# Patient Record
Sex: Male | Born: 1955 | Race: White | Hispanic: No | Marital: Married | Smoking: Never smoker
Health system: Southern US, Community
[De-identification: ages and names within clinical notes are randomized; demographics above are authoritative.]

## PROBLEM LIST (undated history)

## (undated) DIAGNOSIS — E119 Type 2 diabetes mellitus without complications: Secondary | ICD-10-CM

## (undated) DIAGNOSIS — Q613 Polycystic kidney, unspecified: Secondary | ICD-10-CM

## (undated) DIAGNOSIS — I509 Heart failure, unspecified: Secondary | ICD-10-CM

## (undated) DIAGNOSIS — I10 Essential (primary) hypertension: Secondary | ICD-10-CM

## (undated) DIAGNOSIS — C439 Malignant melanoma of skin, unspecified: Secondary | ICD-10-CM

---

## 2005-05-29 HISTORY — PX: OTHER SURGICAL HISTORY: SHX169

## 2005-07-03 ENCOUNTER — Ambulatory Visit (HOSPITAL_COMMUNITY): Admission: RE | Admit: 2005-07-03 | Discharge: 2005-07-03 | Payer: Self-pay | Admitting: Nephrology

## 2005-07-06 ENCOUNTER — Ambulatory Visit: Payer: Self-pay | Admitting: Infectious Diseases

## 2005-07-06 ENCOUNTER — Inpatient Hospital Stay (HOSPITAL_COMMUNITY): Admission: AD | Admit: 2005-07-06 | Discharge: 2005-07-09 | Payer: Self-pay | Admitting: Vascular Surgery

## 2005-07-06 ENCOUNTER — Emergency Department (HOSPITAL_COMMUNITY): Admission: EM | Admit: 2005-07-06 | Discharge: 2005-07-06 | Payer: Self-pay | Admitting: Emergency Medicine

## 2005-07-17 ENCOUNTER — Ambulatory Visit (HOSPITAL_COMMUNITY): Admission: RE | Admit: 2005-07-17 | Discharge: 2005-07-17 | Payer: Self-pay | Admitting: Nephrology

## 2005-08-14 HISTORY — PX: OTHER SURGICAL HISTORY: SHX169

## 2006-07-26 DIAGNOSIS — Z940 Kidney transplant status: Secondary | ICD-10-CM | POA: Insufficient documentation

## 2006-11-26 IMAGING — CT CT ANGIO CHEST
2 series · 19 of 32 positions shown · IV contrast (APPLIED)
Comparison: none

CLINICAL DATA: Renal failure with dyspnea.  Question acute pulmonary embolism.  
 CT ANGIOGRAPHY OF CHEST:
TECHNIQUE: Multidetector CT imaging of the chest was performed during bolus injection of intravenous contrast.  Multiplanar CT angiographic image reconstructions were generated to evaluate the vascular anatomy.
 Contrast:  90 cc Omnipaque 300

[Series 4: pulm embolism 2.0 st · axial · 0.64mm/px · z∈[-304,-28]mm · 16 of 158 slices shown]
[im 10/158  lung]
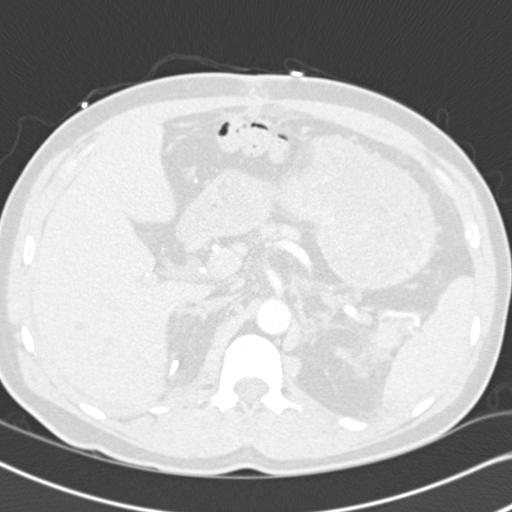
[im 19/158  soft-tissue]
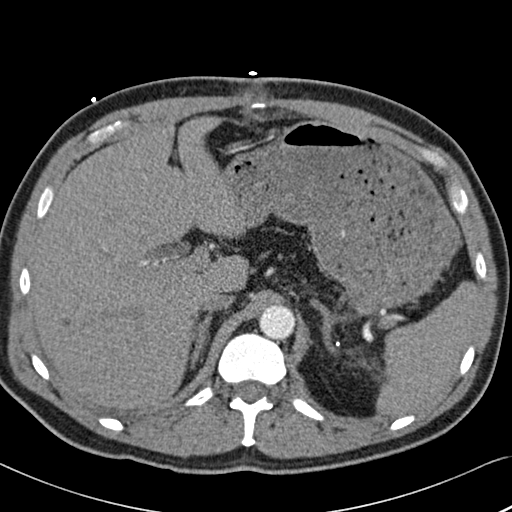
[im 28/158  lung]
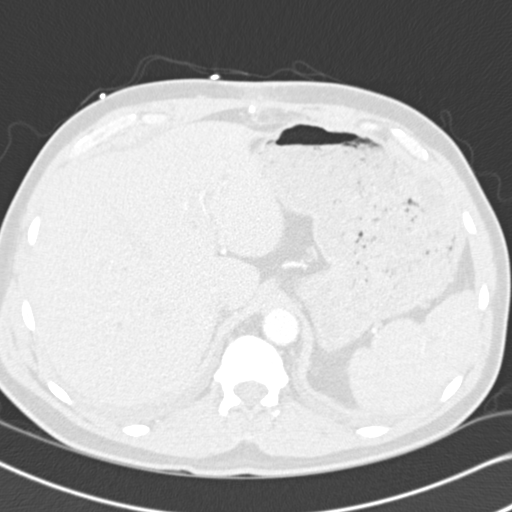
[im 37/158  soft-tissue]
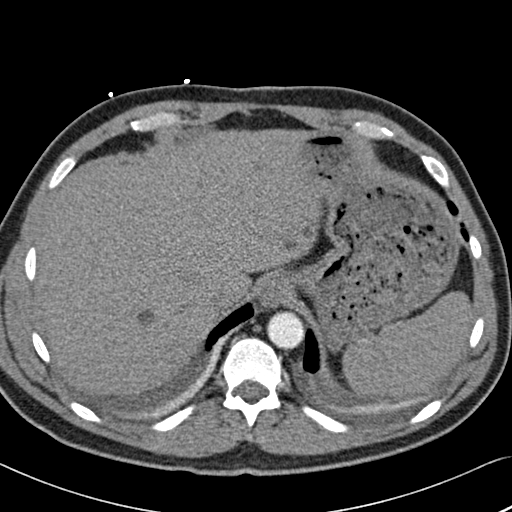
[im 47/158  lung]
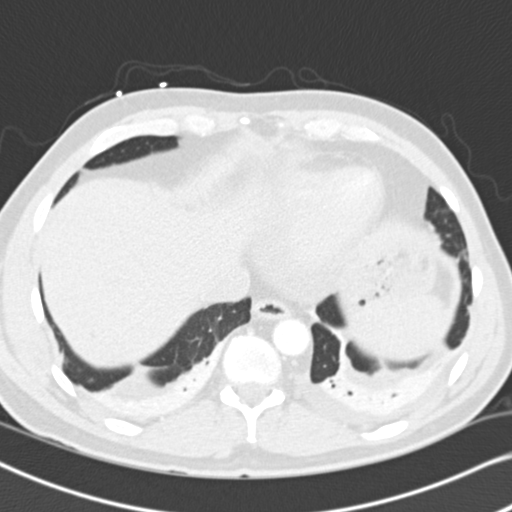
[im 56/158  soft-tissue]
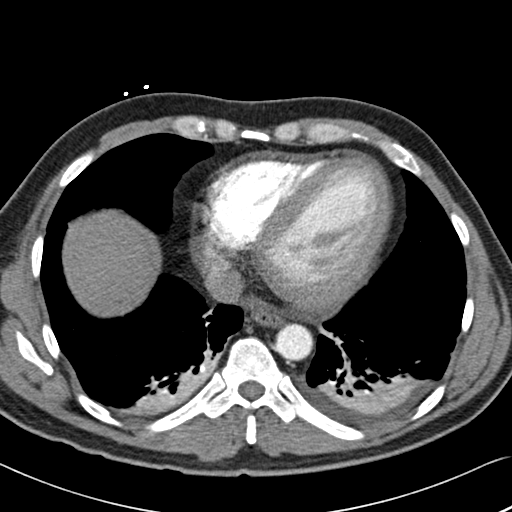
[im 65/158  lung]
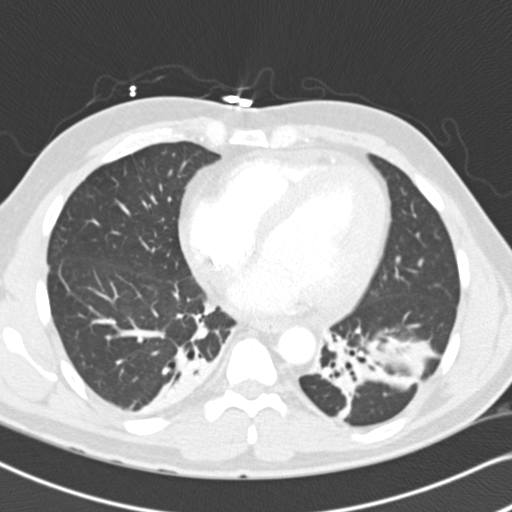
[im 74/158  soft-tissue]
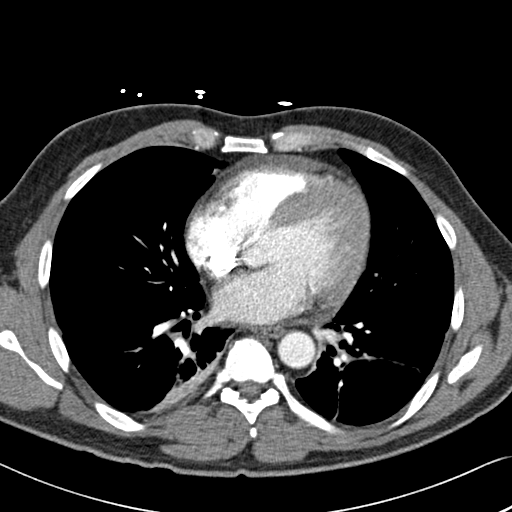
[im 84/158  lung]
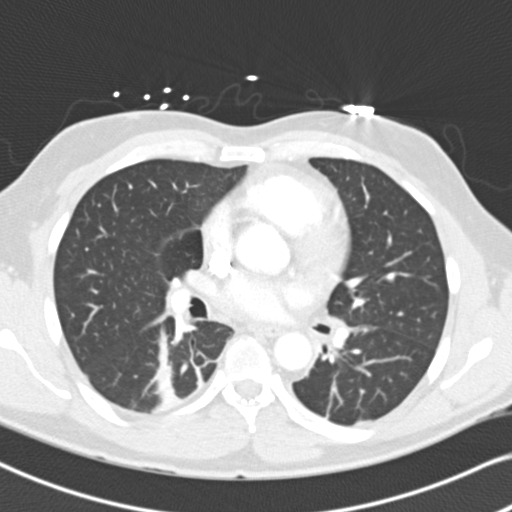
[im 93/158  soft-tissue]
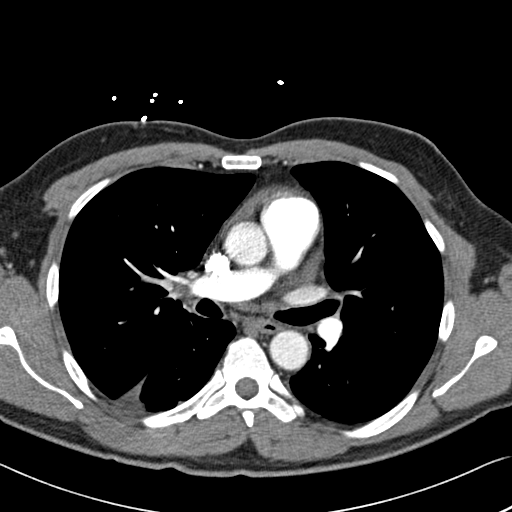
[im 102/158  lung]
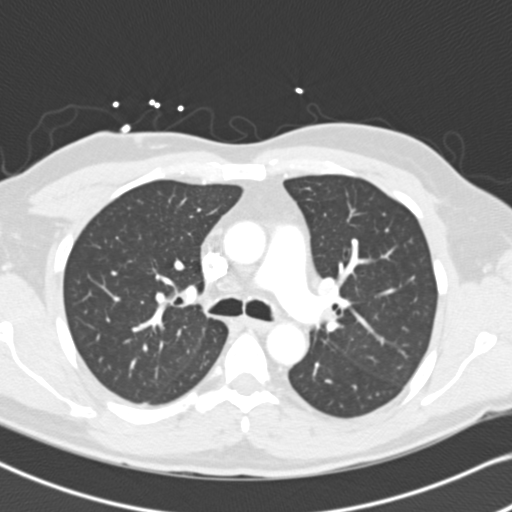
[im 111/158  soft-tissue]
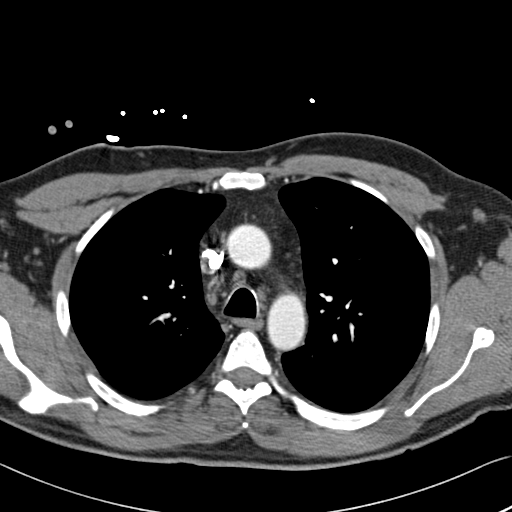
[im 121/158  lung]
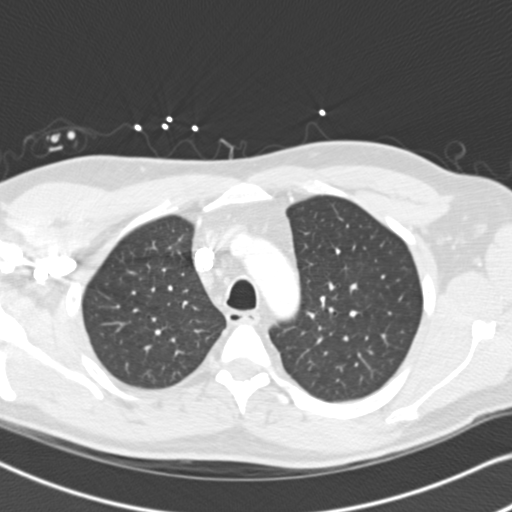
[im 130/158  soft-tissue]
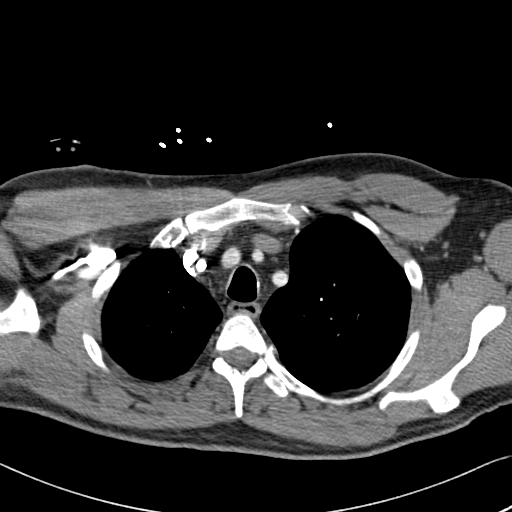
[im 139/158  lung]
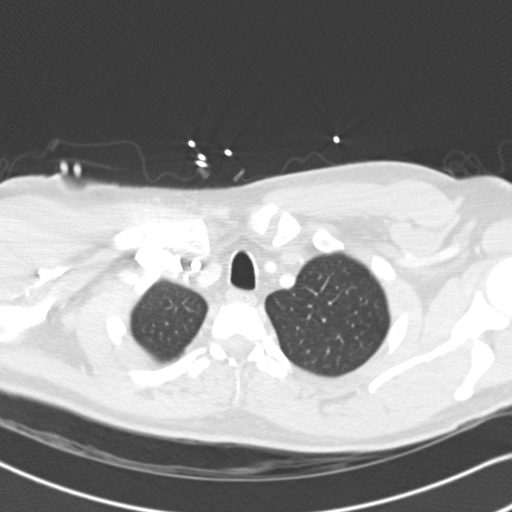
[im 148/158  soft-tissue]
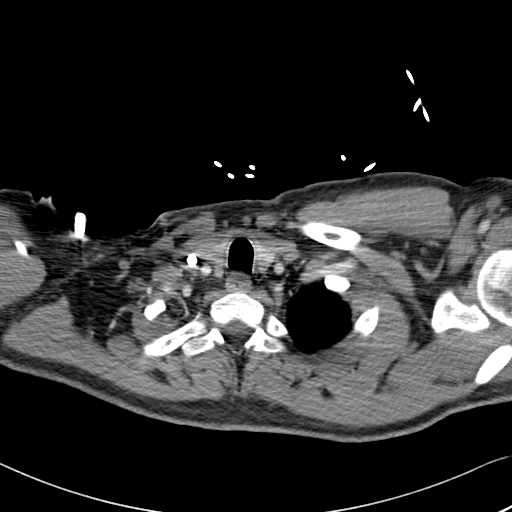

[Series 5: pulm embolism 2.0 lung · axial · 0.64mm/px · z∈[-232,-176]mm · 3 of 122 slices shown]
[im 10/122  soft-tissue]
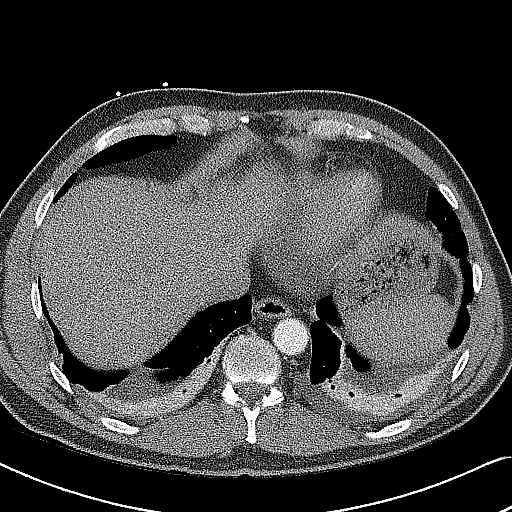
[im 28/122  soft-tissue]
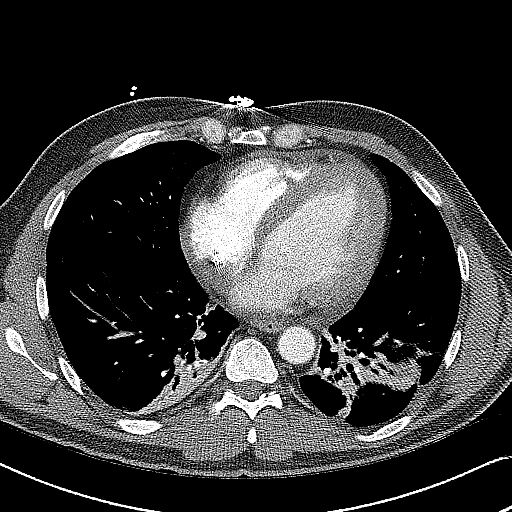
[im 38/122  soft-tissue]
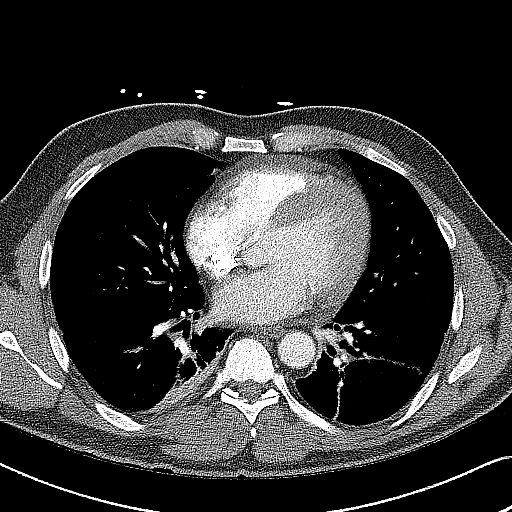

[19 of 32 positions shown; findings below may reference images not displayed]

FINDINGS: The pulmonary arteries are well opacified with contrast.  There is no evidence of acute pulmonary embolism.  The thoracic aorta appears normal.  There is no mediastinal hematoma.  Scattered small lymph nodes are present throughout the mediastinum and hila bilaterally.  There are no enlarged lymph nodes.  There are small bilateral pleural effusions as well as a small pericardial effusion.  Significant dependent opacities are present in both lower lobes with volume loss and bronchiectasis.  While some of this is due to atelectasis, I would be concerned about the possibility of recurrent aspiration.  Correlate clinically.  Images through the upper lobe demonstrate numerous low attenuation liver lesions which are probably cysts in this patient with reported adult polycystic kidney disease.  There is some edema in the retroperitoneum attributed to the reported bilateral nephrectomy.
IMPRESSION: 1.  No evidence of acute pulmonary embolism. 
 2.  Small bilateral pleural effusions and small pericardial effusion.  
 3.  Dependent airspace opacities in both lower lobes with associated bronchiectasis.  Consider aspiration.  
 4.  Findings have been reviewed by telephone with Dr. Andalas at [phone] hours.

## 2012-06-11 DIAGNOSIS — Q613 Polycystic kidney, unspecified: Secondary | ICD-10-CM

## 2012-06-11 HISTORY — DX: Polycystic kidney, unspecified: Q61.3

## 2013-02-27 DIAGNOSIS — C4920 Malignant neoplasm of connective and soft tissue of unspecified lower limb, including hip: Secondary | ICD-10-CM | POA: Insufficient documentation

## 2013-05-22 DIAGNOSIS — D849 Immunodeficiency, unspecified: Secondary | ICD-10-CM | POA: Diagnosis present

## 2018-10-18 DIAGNOSIS — I509 Heart failure, unspecified: Secondary | ICD-10-CM

## 2018-10-18 HISTORY — DX: Heart failure, unspecified: I50.9

## 2018-11-11 ENCOUNTER — Telehealth (HOSPITAL_COMMUNITY): Payer: Self-pay | Admitting: *Deleted

## 2018-11-11 NOTE — Telephone Encounter (Signed)
Referral received from Dr. Aline Brochure at [facility] for pt to participate in Cardiac Rehab with the diagnosis of Systolic Heart Failure.  Medical history reviewed in care everywhere. Pt seen in the heart failure clinic on 12/17. Pt most recent echo shows EF of 30-35%. Pt seen by multiple providers - oncology, nephrology and endocrinology. Will have support staff make initial contact phone call, send MD order for Dr. Aline Brochure to complete, request 12 lead ekg tracing and verify insurance benefits/eligibility .  Once referral signed and received. Pt is appropriate to schedule for cardiac rehab. Cherre Huger, BSN Cardiac and Training and development officer

## 2018-11-26 ENCOUNTER — Telehealth (HOSPITAL_COMMUNITY): Payer: Self-pay

## 2018-11-26 NOTE — Telephone Encounter (Signed)
Attempted to call patient in regards to Cardiac Rehab - LM on VM 

## 2018-11-26 NOTE — Telephone Encounter (Signed)
Pt insurance is active and benefits verified through Medicare A/B. Co-pay $0.00, DED $198.00/$0.00 met, out of pocket $0.00/$0.00 met, co-insurance 20%. No pre-authorization required. Passport, 11/26/2018 @ 1:54PM, REF# [id number]

## 2018-11-27 ENCOUNTER — Telehealth (HOSPITAL_COMMUNITY): Payer: Self-pay

## 2018-11-27 NOTE — Telephone Encounter (Signed)
Pt returned [facility] phone call and stated he would like to join [facility]. Pt will come in for orientation 01/07/2019 @ 130PM and will attend the 11:15AM class.  Mailed homework package.  went over insurance, patient verbalized understanding

## 2019-01-01 ENCOUNTER — Telehealth (HOSPITAL_COMMUNITY): Payer: Self-pay

## 2019-01-01 NOTE — Telephone Encounter (Signed)
Cardiac Rehab Medication Review by a Pharmacist  Does the patient  feel that his/her medications are working for him/her?  yes  Has the patient been experiencing any side effects to the medications prescribed?  no  Does the patient measure his/her own blood pressure or blood glucose at home?  yes - BP 120-130/70-85 average  Does the patient have any problems obtaining medications due to transportation or finances?   no  Understanding of regimen: excellent Understanding of indications: excellent Potential of compliance: excellent    Pharmacist comments: Pt understands medication regimen and reports compliance.    Nicholas Glenn [facility] 01/01/2019 10:45 AM

## 2019-01-06 NOTE — Progress Notes (Signed)
Nicholas Glenn 63 y.o. male DOB [birth year] MRN [id number]       Nutrition Screen Note  No diagnosis found. No past medical history on file. Meds reviewed.    Current Outpatient Medications (Endocrine & Metabolic):  .  predniSONE (DELTASONE) 5 MG tablet, Take 5 mg by mouth daily with breakfast.  Current Outpatient Medications (Cardiovascular):  .  doxazosin (CARDURA) 2 MG tablet, Take 2 mg by mouth daily. .  hydrALAZINE (APRESOLINE) 50 MG tablet, Take 50 mg by mouth 3 (three) times daily. .  isosorbide dinitrate (ISORDIL) 20 MG tablet, Take 20 mg by mouth 3 (three) times daily. .  metoprolol (TOPROL-XL) 200 MG 24 hr tablet, Take 200 mg by mouth daily. .  simvastatin (ZOCOR) 20 MG tablet, Take 20 mg by mouth daily.   Current Outpatient Medications (Analgesics):  .  aspirin 81 MG chewable tablet, Chew 81 mg by mouth daily.   Current Outpatient Medications (Other):  .  mycophenolate (CELLCEPT) 250 MG capsule, Take 750 mg by mouth 2 (two) times daily. .  polycarbophil (FIBERCON) 625 MG tablet, Take 625 mg by mouth daily. .  tacrolimus (PROGRAF) 0.5 MG capsule, Take 0.5 mg by mouth at bedtime. .  tacrolimus (PROGRAF) 1 MG capsule, Take 1 mg by mouth daily at 6 (six) AM.   HT: Ht Readings from Last 1 Encounters:  No data found for Ht    WT: Wt Readings from Last 5 Encounters:  No data found for Wt      There is no height or weight on file to calculate BMI.         Labs:  Lipid Panel  No results found for: CHOL, TRIG, HDL, CHOLHDL, VLDL, LDLCALC, LDLDIRECT  No results found for: HGBA1C CBG (last 3)  No results for input(s): GLUCAP in the last 72 hours.  Nutrition Diagnosis ? Food-and nutrition-related knowledge deficit related to lack of exposure to information as related to diagnosis of: ? CVD  ? T2DM ? CHF   Nutrition Goal(s):  ? To be determined  Plan:  Pt to attend nutrition classes ? Nutrition I ? Nutrition II ? Portion Distortion  ? Diabetes Blitz ? Diabetes Q  & A Will provide client-centered nutrition education as part of interdisciplinary care.   Monitor and evaluate progress toward nutrition goal with team.  Laurina Bustle, MS, RD, LDN 01/06/2019 2:20 PM

## 2019-01-07 ENCOUNTER — Encounter (HOSPITAL_COMMUNITY)
Admission: RE | Admit: 2019-01-07 | Discharge: 2019-01-07 | Disposition: A | Discharge status: Home / Self Care | Payer: Medicare Other | Source: Ambulatory Visit | Attending: Cardiology | Admitting: Cardiology

## 2019-01-07 ENCOUNTER — Encounter (HOSPITAL_COMMUNITY): Payer: Self-pay

## 2019-01-07 VITALS — BP 122/78 | HR 68 | Ht 69.5 in | Wt 189.4 lb

## 2019-01-07 DIAGNOSIS — I5022 Chronic systolic (congestive) heart failure: Secondary | ICD-10-CM | POA: Diagnosis present

## 2019-01-07 HISTORY — DX: Type 2 diabetes mellitus without complications: E11.9

## 2019-01-07 HISTORY — DX: Malignant melanoma of skin, unspecified: C43.9

## 2019-01-07 HISTORY — DX: Polycystic kidney, unspecified: Q61.3

## 2019-01-07 HISTORY — DX: Essential (primary) hypertension: I10

## 2019-01-07 HISTORY — DX: Heart failure, unspecified: I50.9

## 2019-01-07 NOTE — Progress Notes (Addendum)
Cardiac Individual Treatment Plan  Patient Details  Name: Nicholas Glenn MRN: [id number] Date of Birth: 1956/02/19 Referring Provider:     CARDIAC REHAB PHASE II ORIENTATION from 01/07/2019 in [place]  Referring Provider  Dr. Aline Brochure, Dr. Radford Pax      Initial Encounter Date:    CARDIAC REHAB PHASE II ORIENTATION from 01/07/2019 in [place]  Date  01/07/19      Visit Diagnosis: 40/9811 Chronic systolic heart failure  Patient's Home Medications on Admission:  Current Outpatient Medications:  .  aspirin 81 MG chewable tablet, Chew 81 mg by mouth daily., Disp: , Rfl:  .  doxazosin (CARDURA) 2 MG tablet, Take 2 mg by mouth daily., Disp: , Rfl:  .  hydrALAZINE (APRESOLINE) 50 MG tablet, Take 50 mg by mouth 3 (three) times daily., Disp: , Rfl:  .  isosorbide dinitrate (ISORDIL) 20 MG tablet, Take 20 mg by mouth 3 (three) times daily., Disp: , Rfl:  .  metoprolol (TOPROL-XL) 200 MG 24 hr tablet, Take 200 mg by mouth daily., Disp: , Rfl:  .  mycophenolate (CELLCEPT) 250 MG capsule, Take 750 mg by mouth 2 (two) times daily., Disp: , Rfl:  .  polycarbophil (FIBERCON) 625 MG tablet, Take 625 mg by mouth daily., Disp: , Rfl:  .  predniSONE (DELTASONE) 5 MG tablet, Take 5 mg by mouth daily with breakfast., Disp: , Rfl:  .  simvastatin (ZOCOR) 20 MG tablet, Take 20 mg by mouth daily., Disp: , Rfl:  .  tacrolimus (PROGRAF) 0.5 MG capsule, Take 0.5 mg by mouth at bedtime., Disp: , Rfl:  .  tacrolimus (PROGRAF) 1 MG capsule, Take 1 mg by mouth daily at 6 (six) AM., Disp: , Rfl:   Past Medical History: Past Medical History:  Diagnosis Date  . CHF (congestive heart failure) ([place]) 10/18/2018   Hospitalized at the [facility]  . Diabetes mellitus without complication ([place])   . Hypertension   . Melanoma ([place]) 1997, [id number], 03/2015  . Polycystic kidney disease 06/11/2012    Tobacco Use: Social History   Tobacco Use   Smoking Status Never Smoker  Smokeless Tobacco Never Used    Labs: Recent Review Flowsheet Data    There is no flowsheet data to display.      Capillary Blood Glucose: No results found for: GLUCAP   Exercise Target Goals: Exercise Program Goal: Individual exercise prescription set using results from initial 6 min walk test and THRR while considering  patient's activity barriers and safety.   Exercise Prescription Goal: Initial exercise prescription builds to 30-45 minutes a day of aerobic activity, 2-3 days per week.  Home exercise guidelines will be given to patient during program as part of exercise prescription that the participant will acknowledge.  Activity Barriers & Risk Stratification: Activity Barriers & Cardiac Risk Stratification - 01/07/19 1457      Activity Barriers & Cardiac Risk Stratification   Activity Barriers  None    Cardiac Risk Stratification  High       6 Minute Walk: 6 Minute Walk    Row Name 01/07/19 1508         6 Minute Walk   Phase  Initial     Distance  1836 feet     Walk Time  6 minutes     # of Rest Breaks  0     MPH  3.4     METS  4.6     RPE  11     VO2 Peak  16.3     Symptoms  No     Resting HR  68 bpm     Resting BP  122/78     Resting Oxygen Saturation   97 %     Exercise Oxygen Saturation  during 6 min walk  96 %     Max Ex. HR  109 bpm     Max Ex. BP  150/80     2 Minute Post BP  126/74        Oxygen Initial Assessment:   Oxygen Re-Evaluation:   Oxygen Discharge (Final Oxygen Re-Evaluation):   Initial Exercise Prescription: Initial Exercise Prescription - 01/07/19 1500      Date of Initial Exercise RX and Referring Provider   Date  01/07/19    Referring Provider  Dr. Aline Brochure, Dr. Radford Pax    Expected Discharge Date  01/11/19      Treadmill   MPH  4.2    Grade  0    Minutes  10      Bike   Level  1.5    Minutes  10    METs  4.33      Rower   Level  3    Watts  50    Minutes  10    METs  4.5       Prescription Details   Frequency (times per week)  3    Duration  Progress to 30 minutes of continuous aerobic without signs/symptoms of physical distress      Intensity   THRR 40-80% of Max Heartrate  64-128    Ratings of Perceived Exertion  11-13      Progression   Progression  Continue to progress workloads to maintain intensity without signs/symptoms of physical distress.      Resistance Training   Training Prescription  Yes    Weight  4 lbs.     Reps  10-15       Perform Capillary Blood Glucose checks as needed.  Exercise Prescription Changes:   Exercise Comments:   Exercise Goals and Review: Exercise Goals    Row Name 01/07/19 1455             Exercise Goals   Increase Physical Activity  Yes       Intervention  Provide advice, education, support and counseling about physical activity/exercise needs.;Develop an individualized exercise prescription for aerobic and resistive training based on initial evaluation findings, risk stratification, comorbidities and participant's personal goals.       Expected Outcomes  Short Term: Attend rehab on a regular basis to increase amount of physical activity.       Increase Strength and Stamina  Yes       Intervention  Provide advice, education, support and counseling about physical activity/exercise needs.;Develop an individualized exercise prescription for aerobic and resistive training based on initial evaluation findings, risk stratification, comorbidities and participant's personal goals.       Expected Outcomes  Short Term: Increase workloads from initial exercise prescription for resistance, speed, and METs.       Able to understand and use rate of perceived exertion (RPE) scale  Yes       Intervention  Provide education and explanation on how to use RPE scale       Expected Outcomes  Short Term: Able to use RPE daily in rehab to express subjective intensity level;Long Term:  Able to use RPE to guide intensity level when  exercising independently       Knowledge and understanding of Target Heart Rate Range (THRR)  Yes       Intervention  Provide education and explanation of THRR including how the numbers were predicted and where they are located for reference       Expected Outcomes  Short Term: Able to state/look up THRR;Long Term: Able to use THRR to govern intensity when exercising independently;Short Term: Able to use daily as guideline for intensity in rehab       Able to check pulse independently  Yes       Intervention  Provide education and demonstration on how to check pulse in carotid and radial arteries.;Review the importance of being able to check your own pulse for safety during independent exercise       Expected Outcomes  Short Term: Able to explain why pulse checking is important during independent exercise;Long Term: Able to check pulse independently and accurately       Understanding of Exercise Prescription  Yes       Intervention  Provide education, explanation, and written materials on patient's individual exercise prescription       Expected Outcomes  Short Term: Able to explain program exercise prescription;Long Term: Able to explain home exercise prescription to exercise independently          Exercise Goals Re-Evaluation :   Discharge Exercise Prescription (Final Exercise Prescription Changes):   Nutrition:  Target Goals: Understanding of nutrition guidelines, daily intake of sodium 1500mg , cholesterol 200mg , calories 30% from fat and 7% or less from saturated fats, daily to have 5 or more servings of fruits and vegetables.  Biometrics: Pre Biometrics - 01/07/19 1518      Pre Biometrics   Height  5' 9.5" (1.765 m)    Weight  85.9 kg    Waist Circumference  40.5 inches    Hip Circumference  39 inches    Waist to Hip Ratio  1.04 %    BMI (Calculated)  27.57    Triceps Skinfold  26 mm    % Body Fat  29.6 %    Grip Strength  41 kg    Flexibility  12 in    Single Leg Stand  30  seconds        Nutrition Therapy Plan and Nutrition Goals:   Nutrition Assessments:   Nutrition Goals Re-Evaluation:   Nutrition Goals Re-Evaluation:   Nutrition Goals Discharge (Final Nutrition Goals Re-Evaluation):   Psychosocial: Target Goals: Acknowledge presence or absence of significant depression and/or stress, maximize coping skills, provide positive support system. Participant is able to verbalize types and ability to use techniques and skills needed for reducing stress and depression.  Initial Review & Psychosocial Screening: Initial Psych Review & Screening - 01/07/19 1345      Initial Review   Current issues with  None Identified      Family Dynamics   Good Support System?  Yes   Enrique has his wife Webb Silversmith for support     Barriers   Psychosocial barriers to participate in program  There are no identifiable barriers or psychosocial needs.      Screening Interventions   Interventions  Encouraged to exercise       Quality of Life Scores: Quality of Life - 01/07/19 1346      Quality of Life   Select  Quality of Life      Quality of Life Scores   Health/Function Pre  29.6 %  Socioeconomic Pre  30 %    Psych/Spiritual Pre  30 %    Family Pre  30 %    GLOBAL Pre  29.82 %      Scores of 19 and below usually indicate a poorer quality of life in these areas.  A difference of  2-3 points is a clinically meaningful difference.  A difference of 2-3 points in the total score of the Quality of Life Index has been associated with significant improvement in overall quality of life, self-image, physical symptoms, and general health in studies assessing change in quality of life.  PHQ-9: Recent Review Flowsheet Data    There is no flowsheet data to display.     Interpretation of Total Score  Total Score Depression Severity:  1-4 = Minimal depression, 5-9 = Mild depression, 10-14 = Moderate depression, 15-19 = Moderately severe depression, 20-27 = Severe  depression   Psychosocial Evaluation and Intervention:   Psychosocial Re-Evaluation:   Psychosocial Discharge (Final Psychosocial Re-Evaluation):   Vocational Rehabilitation: Provide vocational rehab assistance to qualifying candidates.   Vocational Rehab Evaluation & Intervention: Vocational Rehab - 01/07/19 1613      Initial Vocational Rehab Evaluation & Intervention   Assessment shows need for Vocational Rehabilitation  No   Abbe Amsterdam is retired and does not need vocational rehab at this time      Education: Education Goals: Education classes will be provided on a weekly basis, covering required topics. Participant will state understanding/return demonstration of topics presented.  Learning Barriers/Preferences: Learning Barriers/Preferences - 01/07/19 1504      Learning Barriers/Preferences   Learning Barriers  Sight    Learning Preferences  Written Material       Education Topics: Count Your Pulse:  -Group instruction provided by verbal instruction, demonstration, patient participation and written materials to support subject.  Instructors address importance of being able to find your pulse and how to count your pulse when at home without a heart monitor.  Patients get hands on experience counting their pulse with staff help and individually.   Heart Attack, Angina, and Risk Factor Modification:  -Group instruction provided by verbal instruction, video, and written materials to support subject.  Instructors address signs and symptoms of angina and heart attacks.    Also discuss risk factors for heart disease and how to make changes to improve heart health risk factors.   Functional Fitness:  -Group instruction provided by verbal instruction, demonstration, patient participation, and written materials to support subject.  Instructors address safety measures for doing things around the house.  Discuss how to get up and down off the floor, how to pick things up properly, how  to safely get out of a chair without assistance, and balance training.   Meditation and Mindfulness:  -Group instruction provided by verbal instruction, patient participation, and written materials to support subject.  Instructor addresses importance of mindfulness and meditation practice to help reduce stress and improve awareness.  Instructor also leads participants through a meditation exercise.    Stretching for Flexibility and Mobility:  -Group instruction provided by verbal instruction, patient participation, and written materials to support subject.  Instructors lead participants through series of stretches that are designed to increase flexibility thus improving mobility.  These stretches are additional exercise for major muscle groups that are typically performed during regular warm up and cool down.   Hands Only CPR:  -Group verbal, video, and participation provides a basic overview of AHA guidelines for community CPR. Role-play of emergencies allow participants  the opportunity to practice calling for help and chest compression technique with discussion of AED use.   Hypertension: -Group verbal and written instruction that provides a basic overview of hypertension including the most recent diagnostic guidelines, risk factor reduction with self-care instructions and medication management.    Nutrition I class: Heart Healthy Eating:  -Group instruction provided by PowerPoint slides, verbal discussion, and written materials to support subject matter. The instructor gives an explanation and review of the Therapeutic Lifestyle Changes diet recommendations, which includes a discussion on lipid goals, dietary fat, sodium, fiber, plant stanol/sterol esters, sugar, and the components of a well-balanced, healthy diet.   Nutrition II class: Lifestyle Skills:  -Group instruction provided by PowerPoint slides, verbal discussion, and written materials to support subject matter. The instructor  gives an explanation and review of label reading, grocery shopping for heart health, heart healthy recipe modifications, and ways to make healthier choices when eating out.   Diabetes Question & Answer:  -Group instruction provided by PowerPoint slides, verbal discussion, and written materials to support subject matter. The instructor gives an explanation and review of diabetes co-morbidities, pre- and post-prandial blood glucose goals, pre-exercise blood glucose goals, signs, symptoms, and treatment of hypoglycemia and hyperglycemia, and foot care basics.   Diabetes Blitz:  -Group instruction provided by PowerPoint slides, verbal discussion, and written materials to support subject matter. The instructor gives an explanation and review of the physiology behind type 1 and type 2 diabetes, diabetes medications and rational behind using different medications, pre- and post-prandial blood glucose recommendations and Hemoglobin A1c goals, diabetes diet, and exercise including blood glucose guidelines for exercising safely.    Portion Distortion:  -Group instruction provided by PowerPoint slides, verbal discussion, written materials, and food models to support subject matter. The instructor gives an explanation of serving size versus portion size, changes in portions sizes over the last 20 years, and what consists of a serving from each food group.   Stress Management:  -Group instruction provided by verbal instruction, video, and written materials to support subject matter.  Instructors review role of stress in heart disease and how to cope with stress positively.     Exercising on Your Own:  -Group instruction provided by verbal instruction, power point, and written materials to support subject.  Instructors discuss benefits of exercise, components of exercise, frequency and intensity of exercise, and end points for exercise.  Also discuss use of nitroglycerin and activating EMS.  Review options of  places to exercise outside of rehab.  Review guidelines for sex with heart disease.   Cardiac Drugs I:  -Group instruction provided by verbal instruction and written materials to support subject.  Instructor reviews cardiac drug classes: antiplatelets, anticoagulants, beta blockers, and statins.  Instructor discusses reasons, side effects, and lifestyle considerations for each drug class.   Cardiac Drugs II:  -Group instruction provided by verbal instruction and written materials to support subject.  Instructor reviews cardiac drug classes: angiotensin converting enzyme inhibitors (ACE-I), angiotensin II receptor blockers (ARBs), nitrates, and calcium channel blockers.  Instructor discusses reasons, side effects, and lifestyle considerations for each drug class.   Anatomy and Physiology of the Circulatory System:  Group verbal and written instruction and models provide basic cardiac anatomy and physiology, with the coronary electrical and arterial systems. Review of: AMI, Angina, Valve disease, Heart Failure, Peripheral Artery Disease, Cardiac Arrhythmia, Pacemakers, and the ICD.   Other Education:  -Group or individual verbal, written, or video instructions that support the educational goals of the  cardiac rehab program.   Holiday Eating Survival Tips:  -Group instruction provided by PowerPoint slides, verbal discussion, and written materials to support subject matter. The instructor gives patients tips, tricks, and techniques to help them not only survive but enjoy the holidays despite the onslaught of food that accompanies the holidays.   Knowledge Questionnaire Score: Knowledge Questionnaire Score - 01/07/19 1347      Knowledge Questionnaire Score   Pre Score  23/24       Core Components/Risk Factors/Patient Goals at Admission: Personal Goals and Risk Factors at Admission - 01/07/19 1500      Core Components/Risk Factors/Patient Goals on Admission    Weight Management   Yes;Weight Maintenance    Intervention  Weight Management: Develop a combined nutrition and exercise program designed to reach desired caloric intake, while maintaining appropriate intake of nutrient and fiber, sodium and fats, and appropriate energy expenditure required for the weight goal.;Weight Management: Provide education and appropriate resources to help participant work on and attain dietary goals.    Admit Weight  189 lb 6 oz (85.9 kg)    Expected Outcomes  Short Term: Continue to assess and modify interventions until short term weight is achieved;Long Term: Adherence to nutrition and physical activity/exercise program aimed toward attainment of established weight goal;Weight Maintenance: Understanding of the daily nutrition guidelines, which includes 25-35% calories from fat, 7% or less cal from saturated fats, less than 200mg  cholesterol, less than 1.5gm of sodium, & 5 or more servings of fruits and vegetables daily;Understanding recommendations for meals to include 15-35% energy as protein, 25-35% energy from fat, 35-60% energy from carbohydrates, less than 200mg  of dietary cholesterol, 20-35 gm of total fiber daily;Understanding of distribution of calorie intake throughout the day with the consumption of 4-5 meals/snacks    Diabetes  Yes    Intervention  Provide education about signs/symptoms and action to take for hypo/hyperglycemia.;Provide education about proper nutrition, including hydration, and aerobic/resistive exercise prescription along with prescribed medications to achieve blood glucose in normal ranges: Fasting glucose 65-99 mg/dL    Expected Outcomes  Short Term: Participant verbalizes understanding of the signs/symptoms and immediate care of hyper/hypoglycemia, proper foot care and importance of medication, aerobic/resistive exercise and nutrition plan for blood glucose control.;Long Term: Attainment of HbA1C < 7%.    Heart Failure  Yes    Intervention  Provide a combined exercise  and nutrition program that is supplemented with education, support and counseling about heart failure. Directed toward relieving symptoms such as shortness of breath, decreased exercise tolerance, and extremity edema.    Expected Outcomes  Improve functional capacity of life;Short term: Attendance in program 2-3 days a week with increased exercise capacity. Reported lower sodium intake. Reported increased fruit and vegetable intake. Reports medication compliance.;Short term: Daily weights obtained and reported for increase. Utilizing diuretic protocols set by physician.;Long term: Adoption of self-care skills and reduction of barriers for early signs and symptoms recognition and intervention leading to self-care maintenance.    Hypertension  Yes    Intervention  Provide education on lifestyle modifcations including regular physical activity/exercise, weight management, moderate sodium restriction and increased consumption of fresh fruit, vegetables, and low fat dairy, alcohol moderation, and smoking cessation.;Monitor prescription use compliance.    Expected Outcomes  Short Term: Continued assessment and intervention until BP is < 140/7mm HG in hypertensive participants. < 130/58mm HG in hypertensive participants with diabetes, heart failure or chronic kidney disease.;Long Term: Maintenance of blood pressure at goal levels.    Lipids  Yes  Intervention  Provide education and support for participant on nutrition & aerobic/resistive exercise along with prescribed medications to achieve LDL 70mg , HDL >40mg .    Expected Outcomes  Short Term: Participant states understanding of desired cholesterol values and is compliant with medications prescribed. Participant is following exercise prescription and nutrition guidelines.;Long Term: Cholesterol controlled with medications as prescribed, with individualized exercise RX and with personalized nutrition plan. Value goals: LDL < 70mg , HDL > 40 mg.    Stress  Yes     Intervention  Offer individual and/or small group education and counseling on adjustment to heart disease, stress management and health-related lifestyle change. Teach and support self-help strategies.;Refer participants experiencing significant psychosocial distress to appropriate mental health specialists for further evaluation and treatment. When possible, include family members and significant others in education/counseling sessions.    Expected Outcomes  Short Term: Participant demonstrates changes in health-related behavior, relaxation and other stress management skills, ability to obtain effective social support, and compliance with psychotropic medications if prescribed.;Long Term: Emotional wellbeing is indicated by absence of clinically significant psychosocial distress or social isolation.       Core Components/Risk Factors/Patient Goals Review:    Core Components/Risk Factors/Patient Goals at Discharge (Final Review):    ITP Comments: ITP Comments    Row Name 01/07/19 1329           ITP Comments  Medical Director- Dr. Fransico Him, MD          Comments: Abbe Amsterdam attended orientation from 1333 to 1456 to review rules and guidelines for program. Completed 6 minute walk test, Intitial ITP, and exercise prescription.  VSS. Telemetry-Sinus Rhythm with Arrhythmia.  Asymptomatic. Dr Chanetta Marshall is [facility] cardiologist at [facility]. Dr Fransico Him is covering at [facility].Barnet Pall, RN,BSN 01/07/2019 4:21 PM

## 2019-01-08 NOTE — Progress Notes (Signed)
Mang Hazelrigg 63 y.o. male DOB: 28-Nov-1955 MRN: [id number]      Nutrition Note  1. 22/6333 Chronic systolic heart failure    Past Medical History:  Diagnosis Date  . CHF (congestive heart failure) ([place]) 10/18/2018   Hospitalized at the [facility] [place]  . Diabetes mellitus without complication ([place])   . Hypertension   . Melanoma ([place]) 1997, [id number], 03/2015  . Polycystic kidney disease 06/11/2012   Meds reviewed.   Current Outpatient Medications (Endocrine & Metabolic):  .  predniSONE (DELTASONE) 5 MG tablet, Take 5 mg by mouth daily with breakfast.  Current Outpatient Medications (Cardiovascular):  .  doxazosin (CARDURA) 2 MG tablet, Take 2 mg by mouth daily. .  hydrALAZINE (APRESOLINE) 50 MG tablet, Take 50 mg by mouth 3 (three) times daily. .  isosorbide dinitrate (ISORDIL) 20 MG tablet, Take 20 mg by mouth 3 (three) times daily. .  metoprolol (TOPROL-XL) 200 MG 24 hr tablet, Take 200 mg by mouth daily. .  simvastatin (ZOCOR) 20 MG tablet, Take 20 mg by mouth daily.   Current Outpatient Medications (Analgesics):  .  aspirin 81 MG chewable tablet, Chew 81 mg by mouth daily.   Current Outpatient Medications (Other):  .  mycophenolate (CELLCEPT) 250 MG capsule, Take 750 mg by mouth 2 (two) times daily. .  polycarbophil (FIBERCON) 625 MG tablet, Take 625 mg by mouth daily. .  tacrolimus (PROGRAF) 0.5 MG capsule, Take 0.5 mg by mouth at bedtime. .  tacrolimus (PROGRAF) 1 MG capsule, Take 1 mg by mouth daily at 6 (six) AM.   HT: Ht Readings from Last 1 Encounters:  01/07/19 5' 9.5" (1.765 m)    WT: Wt Readings from Last 5 Encounters:  01/07/19 189 lb 6 oz (85.9 kg)     Body mass index is 27.56 kg/m.   Current tobacco use? No  Labs:  Lipid Panel  No results found for: CHOL, TRIG, HDL, CHOLHDL, VLDL, LDLCALC, LDLDIRECT  No results found for: HGBA1C CBG (last 3)  No results for input(s): GLUCAP in the last 72 hours.  Nutrition Note Spoke with pt. Nutrition  plan and goals reviewed with pt. Pt is following Step 1 of the Therapeutic Lifestyle Changes diet. Pt wants to learn more about heart healthy eating. Heart healthy eating tips reviewed (label reading, how to build a healthy plate, portion sizes, eating frequently across the day, weighing and measuring for accuracy). Pt has Type 2 Diabetes. Last A1c indicates blood glucose well-controlled. This Probation officer went over Diabetes Education test results. Pt checks CBG's 3 times a day. Fasting CBG's reportedly 90-110 mg/dL. Pt with dx of CHF. Per discussion, pt does not use canned/convenience foods often. Pt does not add salt to food. Pt does not eat out frequently. Pt expressed understanding of the information reviewed. Pt aware of nutrition education classes offered and would like to attend nutrition classes.  Nutrition Diagnosis ? Food-and nutrition-related knowledge deficit related to lack of exposure to information as related to diagnosis of: ? CVD ? Type 2 Diabetes  Nutrition Intervention ? Pt's individual nutrition plan and goals reviewed with pt  Nutrition Goal(s):  ? Pt to identify and limit food sources of saturated fat, trans fat, refined carbohydrates and sodium ? Pt to build a healthy plate including vegetables, fruits, whole grains, and low-fat dairy products in a heart healthy meal plan. ? Pt to weigh and measure serving sizes ? Pt to eat a variety of non-starchy vegetables.  Plan:  ? Pt to attend nutrition classes:  ?  Nutrition I ? Nutrition II ? Portion Distortion  ? Diabetes Blitz ? Diabetes Q & Ae determined ? Will provide client-centered nutrition education as part of interdisciplinary care ? Monitor and evaluate progress toward nutrition goal with team.   Laurina Bustle, MS, RD, LDN 01/08/2019 12:16 PM

## 2019-01-13 ENCOUNTER — Encounter (HOSPITAL_COMMUNITY)
Admission: RE | Admit: 2019-01-13 | Discharge: 2019-01-13 | Disposition: A | Discharge status: Home / Self Care | Payer: Medicare Other | Source: Ambulatory Visit | Attending: Cardiology | Admitting: Cardiology

## 2019-01-13 ENCOUNTER — Ambulatory Visit (HOSPITAL_COMMUNITY): Payer: Medicare Other

## 2019-01-13 DIAGNOSIS — I5022 Chronic systolic (congestive) heart failure: Secondary | ICD-10-CM | POA: Diagnosis not present

## 2019-01-13 LAB — GLUCOSE, CAPILLARY
Glucose-Capillary: 147 mg/dL — ABNORMAL HIGH (ref 70–99)
Glucose-Capillary: 111 mg/dL — ABNORMAL HIGH (ref 70–99)

## 2019-01-13 NOTE — Progress Notes (Signed)
Daily Session Note  Patient Details  Name: Nicholas Glenn MRN: [id number] Date of Birth: Sep 01, 1956 Referring Provider:     CARDIAC REHAB PHASE II ORIENTATION from 01/07/2019 in [facility]  Referring Provider  Dr. Aline Brochure, Dr. Radford Pax      Encounter Date: 01/13/2019  Check In: Session Check In - 01/13/19 1151      Check-In   Supervising physician immediately available to respond to emergencies  [facility] immediately available    Physician(s)  Dr. Lonny Prude    Location  [facility]    Staff Present  Barnet Pall, RN, Deland Pretty, MS, ACSM CEP, Exercise Physiologist;Brittany Durene Fruits, BS, ACSM CEP, Exercise Physiologist;Tyara Carol Ada, MS,ACSM CEP, Exercise Physiologist;Jonnae Fonseca Karle Starch, RN, BSN;Other   [name]   Medication changes reported      No    Fall or balance concerns reported     No    Tobacco Cessation  No Change    Warm-up and Cool-down  Performed as group-led instruction    Resistance Training Performed  Yes    VAD Patient?  No    PAD/SET Patient?  No      Pain Assessment   Currently in Pain?  No/denies    Multiple Pain Sites  No       Capillary Blood Glucose: Results for orders placed or performed during the hospital encounter of 01/13/19 (from the past 24 hour(s))  Glucose, capillary     Status: Abnormal   Collection Time: 01/13/19 11:09 AM  Result Value Ref Range   Glucose-Capillary 147 (H) 70 - 99 mg/dL  Glucose, capillary     Status: Abnormal   Collection Time: 01/13/19 12:09 PM  Result Value Ref Range   Glucose-Capillary 111 (H) 70 - 99 mg/dL    Exercise Prescription Changes - 01/13/19 1400      Response to Exercise   Blood Pressure (Admit)  124/64    Blood Pressure (Exercise)  142/78    Blood Pressure (Exit)  100/78    Heart Rate (Admit)  81 bpm    Heart Rate (Exercise)  121 bpm    Heart Rate (Exit)  75 bpm    Rating of Perceived Exertion (Exercise)  11    Perceived Dyspnea (Exercise)  0    Symptoms  None    Comments  Pt oriented to exercise equipment    Duration  Progress to 30 minutes of  aerobic without signs/symptoms of physical distress    Intensity  THRR unchanged      Progression   Progression  Continue to progress workloads to maintain intensity without signs/symptoms of physical distress.    Average METs  2.32      Resistance Training   Training Prescription  Yes    Weight  4lbs    Reps  10-15    Time  10 Minutes      Treadmill   MPH  3.3    Grade  0    Minutes  10      Bike   Level  1.5    Minutes  10    METs  4.29      Rower   Level  3    Watts  38    Minutes  10    METs  5.1       Social History   Tobacco Use  Smoking Status Never Smoker  Smokeless Tobacco Never Used    Goals Met:  Exercise tolerated well  Goals Unmet:  Not Applicable  Comments: Pt started cardiac rehab today.  Pt tolerated light exercise without difficulty. VSS, telemetry-SR, asymptomatic.  Medication list reconciled. Pt denies barriers to medicaiton compliance.  PSYCHOSOCIAL ASSESSMENT:  PHQ-0. Pt exhibits positive coping skills, hopeful outlook with supportive family. No psychosocial needs identified at this time, no psychosocial interventions necessary.  Pt oriented to exercise equipment and routine.    Understanding verbalized.    Dr. Fransico Him is Medical Director for Cardiac Rehab at [facility].

## 2019-01-15 ENCOUNTER — Encounter (HOSPITAL_COMMUNITY)
Admission: RE | Admit: 2019-01-15 | Discharge: 2019-01-15 | Disposition: A | Discharge status: Home / Self Care | Payer: Medicare Other | Source: Ambulatory Visit | Attending: Cardiology | Admitting: Cardiology

## 2019-01-15 ENCOUNTER — Ambulatory Visit (HOSPITAL_COMMUNITY): Payer: Medicare Other

## 2019-01-15 DIAGNOSIS — I5022 Chronic systolic (congestive) heart failure: Secondary | ICD-10-CM

## 2019-01-15 LAB — GLUCOSE, CAPILLARY
Glucose-Capillary: 83 mg/dL (ref 70–99)
Glucose-Capillary: 113 mg/dL — ABNORMAL HIGH (ref 70–99)

## 2019-01-15 NOTE — Progress Notes (Signed)
Nicholas Glenn 63 y.o. male Nutrition Note Spoke with pt. Nutrition Plan and Nutrition Survey goals reviewed with pt. Pt is following a Heart Healthy diet. Pt wants to learn more about heart healthy eating. Heart healthy eating tips reviewed (label reading, how to build a healthy plate, portion sizes, eating frequently across the day, weighing and measuring for accuracy). Pt has Type 2 Diabetes, went over Diabetes Education test results. Pt continues to check CBG's ~3x times a day. Fasting CBG's reportedly 90-110 mg/dL. Pt with dx of CHF. Per discussion, pt does not use canned/convenience foods often. Pt does not add salt to food. Pt does not eat out frequently. Pt expressed understanding of the information reviewed. Pt aware of nutrition education classes offered and would like to attend nutrition classes.  No results found for: HGBA1C  Wt Readings from Last 3 Encounters:  01/07/19 189 lb 6 oz (85.9 kg)    Nutrition Diagnosis   Food-and nutrition-related knowledge deficit related to lack of exposure to information as related to diagnosis of: ? CVD ? Type 2 Diabetes  Nutrition Intervention ? Pt's individual nutrition plan reviewed with pt. ? Benefits of adopting Heart Healthy diet discussed when [employer] reviewed.    Nutrition Goal(s):   Pt to identify and limit food sources of saturated fat, trans fat, refined carbohydrates and sodium  Pt to build a healthy plate including vegetables, fruits, whole grains, and low-fat dairy products in a heart healthy meal plan.  Pt to weigh and measure serving sizes  Pt to eat a variety of non-starchy vegetables  Plan:   Pt to attend nutrition classes ? Nutrition I ? Nutrition II ? Portion Distortion   Will provide client-centered nutrition education as part of interdisciplinary care  Monitor and evaluate progress toward nutrition goal with team.    Laurina Bustle, MS, RD, LDN 01/15/2019 1:31 PM

## 2019-01-17 ENCOUNTER — Ambulatory Visit (HOSPITAL_COMMUNITY): Payer: Medicare Other

## 2019-01-17 ENCOUNTER — Encounter (HOSPITAL_COMMUNITY)
Admission: RE | Admit: 2019-01-17 | Discharge: 2019-01-17 | Disposition: A | Discharge status: Home / Self Care | Payer: Medicare Other | Source: Ambulatory Visit | Attending: Cardiology | Admitting: Cardiology

## 2019-01-17 DIAGNOSIS — I5022 Chronic systolic (congestive) heart failure: Secondary | ICD-10-CM

## 2019-01-20 ENCOUNTER — Ambulatory Visit (HOSPITAL_COMMUNITY): Payer: Medicare Other

## 2019-01-20 ENCOUNTER — Encounter (HOSPITAL_COMMUNITY)
Admission: RE | Admit: 2019-01-20 | Discharge: 2019-01-20 | Disposition: A | Discharge status: Home / Self Care | Payer: Medicare Other | Source: Ambulatory Visit | Attending: Cardiology | Admitting: Cardiology

## 2019-01-20 DIAGNOSIS — I5022 Chronic systolic (congestive) heart failure: Secondary | ICD-10-CM

## 2019-01-22 ENCOUNTER — Encounter (HOSPITAL_COMMUNITY)
Admission: RE | Admit: 2019-01-22 | Discharge: 2019-01-22 | Disposition: A | Discharge status: Home / Self Care | Payer: Medicare Other | Source: Ambulatory Visit | Attending: Cardiology | Admitting: Cardiology

## 2019-01-22 ENCOUNTER — Ambulatory Visit (HOSPITAL_COMMUNITY): Payer: Medicare Other

## 2019-01-22 DIAGNOSIS — I5022 Chronic systolic (congestive) heart failure: Secondary | ICD-10-CM

## 2019-01-23 NOTE — Progress Notes (Signed)
Cardiac Individual Treatment Plan  Patient Details  Name: Nicholas Glenn MRN: [id number] Date of Birth: [birth year] Referring Provider:     CARDIAC REHAB PHASE II ORIENTATION from 01/07/2019 in [place]  Referring Provider  Dr. Aline Brochure, Dr. Radford Pax      Initial Encounter Date:    CARDIAC REHAB PHASE II ORIENTATION from 01/07/2019 in [facility]  Date  01/07/19      Visit Diagnosis: 65/6812 Chronic systolic heart failure  Patient's Home Medications on Admission:  Current Outpatient Medications:  .  aspirin 81 MG chewable tablet, Chew 81 mg by mouth daily., Disp: , Rfl:  .  doxazosin (CARDURA) 2 MG tablet, Take 2 mg by mouth daily., Disp: , Rfl:  .  hydrALAZINE (APRESOLINE) 50 MG tablet, Take 50 mg by mouth 3 (three) times daily., Disp: , Rfl:  .  isosorbide dinitrate (ISORDIL) 20 MG tablet, Take 20 mg by mouth 3 (three) times daily., Disp: , Rfl:  .  metoprolol (TOPROL-XL) 200 MG 24 hr tablet, Take 200 mg by mouth daily., Disp: , Rfl:  .  mycophenolate (CELLCEPT) 250 MG capsule, Take 750 mg by mouth 2 (two) times daily., Disp: , Rfl:  .  polycarbophil (FIBERCON) 625 MG tablet, Take 625 mg by mouth daily., Disp: , Rfl:  .  predniSONE (DELTASONE) 5 MG tablet, Take 5 mg by mouth daily with breakfast., Disp: , Rfl:  .  simvastatin (ZOCOR) 20 MG tablet, Take 20 mg by mouth daily., Disp: , Rfl:  .  tacrolimus (PROGRAF) 0.5 MG capsule, Take 0.5 mg by mouth at bedtime., Disp: , Rfl:  .  tacrolimus (PROGRAF) 1 MG capsule, Take 1 mg by mouth daily at 6 (six) AM., Disp: , Rfl:   Past Medical History: Past Medical History:  Diagnosis Date  . CHF (congestive heart failure) ([place]) 10/18/2018   Hospitalized at the [facility]  . Diabetes mellitus without complication ([place])   . Hypertension   . Melanoma ([place]) 1997, [id number], 03/2015  . Polycystic kidney disease 06/11/2012    Tobacco Use: Social History   Tobacco Use   Smoking Status Never Smoker  Smokeless Tobacco Never Used    Labs: Recent Review Flowsheet Data    There is no flowsheet data to display.      Capillary Blood Glucose: Lab Results  Component Value Date   GLUCAP 83 01/15/2019   GLUCAP 113 (H) 01/15/2019   GLUCAP 111 (H) 01/13/2019   GLUCAP 147 (H) 01/13/2019     Exercise Target Goals: Exercise Program Goal: Individual exercise prescription set using results from initial 6 min walk test and THRR while considering  patient's activity barriers and safety.   Exercise Prescription Goal: Initial exercise prescription builds to 30-45 minutes a day of aerobic activity, 2-3 days per week.  Home exercise guidelines will be given to patient during program as part of exercise prescription that the participant will acknowledge.  Activity Barriers & Risk Stratification: Activity Barriers & Cardiac Risk Stratification - 01/07/19 1457      Activity Barriers & Cardiac Risk Stratification   Activity Barriers  None    Cardiac Risk Stratification  High       6 Minute Walk: 6 Minute Walk    Row Name 01/07/19 1508         6 Minute Walk   Phase  Initial     Distance  1836 feet     Walk Time  6 minutes     #  of Rest Breaks  0     MPH  3.4     METS  4.6     RPE  11     VO2 Peak  16.3     Symptoms  No     Resting HR  68 bpm     Resting BP  122/78     Resting Oxygen Saturation   97 %     Exercise Oxygen Saturation  during 6 min walk  96 %     Max Ex. HR  109 bpm     Max Ex. BP  150/80     2 Minute Post BP  126/74        Oxygen Initial Assessment:   Oxygen Re-Evaluation:   Oxygen Discharge (Final Oxygen Re-Evaluation):   Initial Exercise Prescription: Initial Exercise Prescription - 01/07/19 1500      Date of Initial Exercise RX and Referring Provider   Date  01/07/19    Referring Provider  Dr. Aline Brochure, Dr. Radford Pax    Expected Discharge Date  01/11/19      Treadmill   MPH  4.2    Grade  0    Minutes  10       Bike   Level  1.5    Minutes  10    METs  4.33      Rower   Level  3    Watts  50    Minutes  10    METs  4.5      Prescription Details   Frequency (times per week)  3    Duration  Progress to 30 minutes of continuous aerobic without signs/symptoms of physical distress      Intensity   THRR 40-80% of Max Heartrate  64-128    Ratings of Perceived Exertion  11-13      Progression   Progression  Continue to progress workloads to maintain intensity without signs/symptoms of physical distress.      Resistance Training   Training Prescription  Yes    Weight  4 lbs.     Reps  10-15       Perform Capillary Blood Glucose checks as needed.  Exercise Prescription Changes: Exercise Prescription Changes    Row Name 01/13/19 1400             Response to Exercise   Blood Pressure (Admit)  124/64       Blood Pressure (Exercise)  142/78       Blood Pressure (Exit)  100/78       Heart Rate (Admit)  81 bpm       Heart Rate (Exercise)  121 bpm       Heart Rate (Exit)  75 bpm       Rating of Perceived Exertion (Exercise)  11       Perceived Dyspnea (Exercise)  0       Symptoms  None       Comments  Pt oriented to exercise equipment       Duration  Progress to 30 minutes of  aerobic without signs/symptoms of physical distress       Intensity  THRR unchanged         Progression   Progression  Continue to progress workloads to maintain intensity without signs/symptoms of physical distress.       Average METs  2.32         Resistance Training   Training Prescription  Yes  Weight  4lbs       Reps  10-15       Time  10 Minutes         Treadmill   MPH  3.3       Grade  0       Minutes  10         Bike   Level  1.5       Minutes  10       METs  4.29         Rower   Level  3       Watts  38       Minutes  10       METs  5.1          Exercise Comments: Exercise Comments    Row Name 01/13/19 1419           Exercise Comments  Pt's first day of exercise. Pt  responded well to workloads. Will continue to monitor and progress pt as tolerated.           Exercise Goals and Review: Exercise Goals    Row Name 01/07/19 1455             Exercise Goals   Increase Physical Activity  Yes       Intervention  Provide advice, education, support and counseling about physical activity/exercise needs.;Develop an individualized exercise prescription for aerobic and resistive training based on initial evaluation findings, risk stratification, comorbidities and participant's personal goals.       Expected Outcomes  Short Term: Attend rehab on a regular basis to increase amount of physical activity.       Increase Strength and Stamina  Yes       Intervention  Provide advice, education, support and counseling about physical activity/exercise needs.;Develop an individualized exercise prescription for aerobic and resistive training based on initial evaluation findings, risk stratification, comorbidities and participant's personal goals.       Expected Outcomes  Short Term: Increase workloads from initial exercise prescription for resistance, speed, and METs.       Able to understand and use rate of perceived exertion (RPE) scale  Yes       Intervention  Provide education and explanation on how to use RPE scale       Expected Outcomes  Short Term: Able to use RPE daily in rehab to express subjective intensity level;Long Term:  Able to use RPE to guide intensity level when exercising independently       Knowledge and understanding of Target Heart Rate Range (THRR)  Yes       Intervention  Provide education and explanation of THRR including how the numbers were predicted and where they are located for reference       Expected Outcomes  Short Term: Able to state/look up THRR;Long Term: Able to use THRR to govern intensity when exercising independently;Short Term: Able to use daily as guideline for intensity in rehab       Able to check pulse independently  Yes        Intervention  Provide education and demonstration on how to check pulse in carotid and radial arteries.;Review the importance of being able to check your own pulse for safety during independent exercise       Expected Outcomes  Short Term: Able to explain why pulse checking is important during independent exercise;Long Term: Able to check pulse independently and accurately  Understanding of Exercise Prescription  Yes       Intervention  Provide education, explanation, and written materials on patient's individual exercise prescription       Expected Outcomes  Short Term: Able to explain program exercise prescription;Long Term: Able to explain home exercise prescription to exercise independently          Exercise Goals Re-Evaluation :   Discharge Exercise Prescription (Final Exercise Prescription Changes): Exercise Prescription Changes - 01/13/19 1400      Response to Exercise   Blood Pressure (Admit)  124/64    Blood Pressure (Exercise)  142/78    Blood Pressure (Exit)  100/78    Heart Rate (Admit)  81 bpm    Heart Rate (Exercise)  121 bpm    Heart Rate (Exit)  75 bpm    Rating of Perceived Exertion (Exercise)  11    Perceived Dyspnea (Exercise)  0    Symptoms  None    Comments  Pt oriented to exercise equipment    Duration  Progress to 30 minutes of  aerobic without signs/symptoms of physical distress    Intensity  THRR unchanged      Progression   Progression  Continue to progress workloads to maintain intensity without signs/symptoms of physical distress.    Average METs  2.32      Resistance Training   Training Prescription  Yes    Weight  4lbs    Reps  10-15    Time  10 Minutes      Treadmill   MPH  3.3    Grade  0    Minutes  10      Bike   Level  1.5    Minutes  10    METs  4.29      Rower   Level  3    Watts  38    Minutes  10    METs  5.1       Nutrition:  Target Goals: Understanding of nutrition guidelines, daily intake of sodium 1500mg ,  cholesterol 200mg , calories 30% from fat and 7% or less from saturated fats, daily to have 5 or more servings of fruits and vegetables.  Biometrics: Pre Biometrics - 01/07/19 1518      Pre Biometrics   Height  5' 9.5" (1.765 m)    Weight  85.9 kg    Waist Circumference  40.5 inches    Hip Circumference  39 inches    Waist to Hip Ratio  1.04 %    BMI (Calculated)  27.57    Triceps Skinfold  26 mm    % Body Fat  29.6 %    Grip Strength  41 kg    Flexibility  12 in    Single Leg Stand  30 seconds        Nutrition Therapy Plan and Nutrition Goals: Nutrition Therapy & Goals - 01/08/19 1504      Nutrition Therapy   Diet  heart healthy, carb modified      Personal Nutrition Goals   Nutrition Goal  Pt to build a healthy plate including vegetables, fruits, whole grains, and low-fat dairy products in a heart healthy meal plan.    Personal Goal #2  Pt to identify and limit food sources of saturated fat, trans fat, refined carbohydrates and sodium    Personal Goal #3  Pt to weigh and measure serving sizes    Personal Goal #4  Pt to eat a variety of non-starchy vegetables.  Intervention Plan   Intervention  Prescribe, educate and counsel regarding individualized specific dietary modifications aiming towards targeted core components such as weight, hypertension, lipid management, diabetes, heart failure and other comorbidities.    Expected Outcomes  Short Term Goal: Understand basic principles of dietary content, such as calories, fat, sodium, cholesterol and nutrients.;Long Term Goal: Adherence to prescribed nutrition plan.       Nutrition Assessments: Nutrition Assessments - 01/08/19 1510      MEDFICTS Scores   Pre Score  26       Nutrition Goals Re-Evaluation: Nutrition Goals Re-Evaluation    Row Name 01/08/19 1504             Goals   Current Weight  189 lb 6 oz (85.9 kg)          Nutrition Goals Re-Evaluation: Nutrition Goals Re-Evaluation    Row Name 01/08/19  1504             Goals   Current Weight  189 lb 6 oz (85.9 kg)          Nutrition Goals Discharge (Final Nutrition Goals Re-Evaluation): Nutrition Goals Re-Evaluation - 01/08/19 1504      Goals   Current Weight  189 lb 6 oz (85.9 kg)       Psychosocial: Target Goals: Acknowledge presence or absence of significant depression and/or stress, maximize coping skills, provide positive support system. Participant is able to verbalize types and ability to use techniques and skills needed for reducing stress and depression.  Initial Review & Psychosocial Screening: Initial Psych Review & Screening - 01/07/19 [place]?  Yes       Quality of Life Scores: Quality of Life - 01/07/19 1346      Quality of Life   Select  Quality of Life      Quality of Life Scores   Health/Function Pre  29.6 %    Socioeconomic Pre  30 %    Psych/Spiritual Pre  30 %    Family Pre  30 %    GLOBAL Pre  29.82 %      Scores of 19 and below usually indicate a poorer quality of life in these areas.  A difference of  2-3 points is a clinically meaningful difference.  A difference of 2-3 points in the total score of the Quality of Life Index has been associated with significant improvement in overall quality of life, self-image, physical symptoms, and general health in studies assessing change in quality of life.  PHQ-9: Recent Review Flowsheet Data    Depression screen [facility] 2/9 01/13/2019   Decreased Interest 0   Down, Depressed, Hopeless 0   PHQ - 2 Score 0     Interpretation of Total Score  Total Score Depression Severity:  1-4 = Minimal depression, 5-9 = Mild depression, 10-14 = Moderate depression, 15-19 = Moderately severe depression, 20-27 = Severe depression   Psychosocial Evaluation and Intervention: Psychosocial Evaluation - 01/13/19 1436      Psychosocial Evaluation & Interventions   Interventions  Encouraged to exercise with the program and follow  exercise prescription    Comments  No psychosocial interventions necessary. Pt enjoys participating as a [employer].    Expected Outcomes  [place] will continue to exhibit a positive outlook with good coping skills.     Continue Psychosocial Services   No Follow up required       Psychosocial Re-Evaluation:  Psychosocial Discharge (Final Psychosocial Re-Evaluation):   Vocational Rehabilitation: Provide vocational rehab assistance to qualifying candidates.   Vocational Rehab Evaluation & Intervention: Vocational Rehab - 01/07/19 1613      Initial Vocational Rehab Evaluation & Intervention   Assessment shows need for Vocational Rehabilitation  No   Abbe Amsterdam is retired and does not need vocational rehab at this time      Education: Education Goals: Education classes will be provided on a weekly basis, covering required topics. Participant will state understanding/return demonstration of topics presented.  Learning Barriers/Preferences: Learning Barriers/Preferences - 01/07/19 1504      Learning Barriers/Preferences   Learning Barriers  Sight    Learning Preferences  Written Material       Education Topics: Count Your Pulse:  -Group instruction provided by verbal instruction, demonstration, patient participation and written materials to support subject.  Instructors address importance of being able to find your pulse and how to count your pulse when at home without a heart monitor.  Patients get hands on experience counting their pulse with staff help and individually.   CARDIAC REHAB PHASE II EXERCISE from 01/22/2019 in [place]  Date  01/17/19  Educator  RN  Instruction Review Code  2- Demonstrated Understanding      Heart Attack, Angina, and Risk Factor Modification:  -Group instruction provided by verbal instruction, video, and written materials to support subject.  Instructors address signs and symptoms of angina and heart  attacks.    Also discuss risk factors for heart disease and how to make changes to improve heart health risk factors.   Functional Fitness:  -Group instruction provided by verbal instruction, demonstration, patient participation, and written materials to support subject.  Instructors address safety measures for doing things around the house.  Discuss how to get up and down off the floor, how to pick things up properly, how to safely get out of a chair without assistance, and balance training.   Meditation and Mindfulness:  -Group instruction provided by verbal instruction, patient participation, and written materials to support subject.  Instructor addresses importance of mindfulness and meditation practice to help reduce stress and improve awareness.  Instructor also leads participants through a meditation exercise.    CARDIAC REHAB PHASE II EXERCISE from 01/22/2019 in [place]  Date  01/22/19  Educator  RN  Instruction Review Code  2- Demonstrated Understanding      Stretching for Flexibility and Mobility:  -Group instruction provided by verbal instruction, patient participation, and written materials to support subject.  Instructors lead participants through series of stretches that are designed to increase flexibility thus improving mobility.  These stretches are additional exercise for major muscle groups that are typically performed during regular warm up and cool down.   Hands Only CPR:  -Group verbal, video, and participation provides a basic overview of AHA guidelines for community CPR. Role-play of emergencies allow participants the opportunity to practice calling for help and chest compression technique with discussion of AED use.   Hypertension: -Group verbal and written instruction that provides a basic overview of hypertension including the most recent diagnostic guidelines, risk factor reduction with self-care instructions and medication  management.    Nutrition I class: Heart Healthy Eating:  -Group instruction provided by PowerPoint slides, verbal discussion, and written materials to support subject matter. The instructor gives an explanation and review of the Therapeutic Lifestyle Changes diet recommendations, which includes a discussion on lipid goals, dietary fat, sodium,  fiber, plant stanol/sterol esters, sugar, and the components of a well-balanced, healthy diet.   Nutrition II class: Lifestyle Skills:  -Group instruction provided by PowerPoint slides, verbal discussion, and written materials to support subject matter. The instructor gives an explanation and review of label reading, grocery shopping for heart health, heart healthy recipe modifications, and ways to make healthier choices when eating out.   Diabetes Question & Answer:  -Group instruction provided by PowerPoint slides, verbal discussion, and written materials to support subject matter. The instructor gives an explanation and review of diabetes co-morbidities, pre- and post-prandial blood glucose goals, pre-exercise blood glucose goals, signs, symptoms, and treatment of hypoglycemia and hyperglycemia, and foot care basics.   Diabetes Blitz:  -Group instruction provided by PowerPoint slides, verbal discussion, and written materials to support subject matter. The instructor gives an explanation and review of the physiology behind type 1 and type 2 diabetes, diabetes medications and rational behind using different medications, pre- and post-prandial blood glucose recommendations and Hemoglobin A1c goals, diabetes diet, and exercise including blood glucose guidelines for exercising safely.    Portion Distortion:  -Group instruction provided by PowerPoint slides, verbal discussion, written materials, and food models to support subject matter. The instructor gives an explanation of serving size versus portion size, changes in portions sizes over the last 20 years,  and what consists of a serving from each food group.   Stress Management:  -Group instruction provided by verbal instruction, video, and written materials to support subject matter.  Instructors review role of stress in heart disease and how to cope with stress positively.     Exercising on Your Own:  -Group instruction provided by verbal instruction, power point, and written materials to support subject.  Instructors discuss benefits of exercise, components of exercise, frequency and intensity of exercise, and end points for exercise.  Also discuss use of nitroglycerin and activating EMS.  Review options of places to exercise outside of rehab.  Review guidelines for sex with heart disease.   CARDIAC REHAB PHASE II EXERCISE from 01/22/2019 in [place]  Date  01/15/19  Educator  EP  Instruction Review Code  2- Demonstrated Understanding      Cardiac Drugs I:  -Group instruction provided by verbal instruction and written materials to support subject.  Instructor reviews cardiac drug classes: antiplatelets, anticoagulants, beta blockers, and statins.  Instructor discusses reasons, side effects, and lifestyle considerations for each drug class.   Cardiac Drugs II:  -Group instruction provided by verbal instruction and written materials to support subject.  Instructor reviews cardiac drug classes: angiotensin converting enzyme inhibitors (ACE-I), angiotensin II receptor blockers (ARBs), nitrates, and calcium channel blockers.  Instructor discusses reasons, side effects, and lifestyle considerations for each drug class.   Anatomy and Physiology of the Circulatory System:  Group verbal and written instruction and models provide basic cardiac anatomy and physiology, with the coronary electrical and arterial systems. Review of: AMI, Angina, Valve disease, Heart Failure, Peripheral Artery Disease, Cardiac Arrhythmia, Pacemakers, and the ICD.   Other Education:   -Group or individual verbal, written, or video instructions that support the educational goals of the cardiac rehab program.   Holiday Eating Survival Tips:  -Group instruction provided by PowerPoint slides, verbal discussion, and written materials to support subject matter. The instructor gives patients tips, tricks, and techniques to help them not only survive but enjoy the holidays despite the onslaught of food that accompanies the holidays.   Knowledge Questionnaire Score: Knowledge Questionnaire Score -  01/07/19 1347      Knowledge Questionnaire Score   Pre Score  23/24       Core Components/Risk Factors/Patient Goals at Admission: Personal Goals and Risk Factors at Admission - 01/07/19 1500      Core Components/Risk Factors/Patient Goals on Admission    Weight Management  Yes;Weight Maintenance    Intervention  Weight Management: Develop a combined nutrition and exercise program designed to reach desired caloric intake, while maintaining appropriate intake of nutrient and fiber, sodium and fats, and appropriate energy expenditure required for the weight goal.;Weight Management: Provide education and appropriate resources to help participant work on and attain dietary goals.    Admit Weight  189 lb 6 oz (85.9 kg)    Expected Outcomes  Short Term: Continue to assess and modify interventions until short term weight is achieved;Long Term: Adherence to nutrition and physical activity/exercise program aimed toward attainment of established weight goal;Weight Maintenance: Understanding of the daily nutrition guidelines, which includes 25-35% calories from fat, 7% or less cal from saturated fats, less than 200mg  cholesterol, less than 1.5gm of sodium, & 5 or more servings of fruits and vegetables daily;Understanding recommendations for meals to include 15-35% energy as protein, 25-35% energy from fat, 35-60% energy from carbohydrates, less than 200mg  of dietary cholesterol, 20-35 gm of total  fiber daily;Understanding of distribution of calorie intake throughout the day with the consumption of 4-5 meals/snacks    Diabetes  Yes    Intervention  Provide education about signs/symptoms and action to take for hypo/hyperglycemia.;Provide education about proper nutrition, including hydration, and aerobic/resistive exercise prescription along with prescribed medications to achieve blood glucose in normal ranges: Fasting glucose 65-99 mg/dL    Expected Outcomes  Short Term: Participant verbalizes understanding of the signs/symptoms and immediate care of hyper/hypoglycemia, proper foot care and importance of medication, aerobic/resistive exercise and nutrition plan for blood glucose control.;Long Term: Attainment of HbA1C < 7%.    Heart Failure  Yes    Intervention  Provide a combined exercise and nutrition program that is supplemented with education, support and counseling about heart failure. Directed toward relieving symptoms such as shortness of breath, decreased exercise tolerance, and extremity edema.    Expected Outcomes  Improve functional capacity of life;Short term: Attendance in program 2-3 days a week with increased exercise capacity. Reported lower sodium intake. Reported increased fruit and vegetable intake. Reports medication compliance.;Short term: Daily weights obtained and reported for increase. Utilizing diuretic protocols set by physician.;Long term: Adoption of self-care skills and reduction of barriers for early signs and symptoms recognition and intervention leading to self-care maintenance.    Hypertension  Yes    Intervention  Provide education on lifestyle modifcations including regular physical activity/exercise, weight management, moderate sodium restriction and increased consumption of fresh fruit, vegetables, and low fat dairy, alcohol moderation, and smoking cessation.;Monitor prescription use compliance.    Expected Outcomes  Short Term: Continued assessment and  intervention until BP is < 140/3mm HG in hypertensive participants. < 130/65mm HG in hypertensive participants with diabetes, heart failure or chronic kidney disease.;Long Term: Maintenance of blood pressure at goal levels.    Lipids  Yes    Intervention  Provide education and support for participant on nutrition & aerobic/resistive exercise along with prescribed medications to achieve LDL 70mg , HDL >40mg .    Expected Outcomes  Short Term: Participant states understanding of desired cholesterol values and is compliant with medications prescribed. Participant is following exercise prescription and nutrition guidelines.;Long Term: Cholesterol controlled with medications as  prescribed, with individualized exercise RX and with personalized nutrition plan. Value goals: LDL < 70mg , HDL > 40 mg.    Stress  Yes    Intervention  Offer individual and/or small group education and counseling on adjustment to heart disease, stress management and health-related lifestyle change. Teach and support self-help strategies.;Refer participants experiencing significant psychosocial distress to appropriate mental health specialists for further evaluation and treatment. When possible, include family members and significant others in education/counseling sessions.    Expected Outcomes  Short Term: Participant demonstrates changes in health-related behavior, relaxation and other stress management skills, ability to obtain effective social support, and compliance with psychotropic medications if prescribed.;Long Term: Emotional wellbeing is indicated by absence of clinically significant psychosocial distress or social isolation.       Core Components/Risk Factors/Patient Goals Review:  Goals and Risk Factor Review    Row Name 01/13/19 1442             Core Components/Risk Factors/Patient Goals Review   Personal Goals Review  Weight Management/Obesity;Heart Failure;Lipids;Hypertension;Diabetes       Review  Pt with  multiple CAD RFs willing to participate in CR exercise.  Shafer would like to lose 10 lbs and establish a good exercise routine.       Expected Outcomes  Pt will continue to participate in CR exercise, nutrition, and lifestyle modification opportunities.           Core Components/Risk Factors/Patient Goals at Discharge (Final Review):  Goals and Risk Factor Review - 01/13/19 1442      Core Components/Risk Factors/Patient Goals Review   Personal Goals Review  Weight Management/Obesity;Heart Failure;Lipids;Hypertension;Diabetes    Review  Pt with multiple CAD RFs willing to participate in CR exercise.  Denis would like to lose 10 lbs and establish a good exercise routine.    Expected Outcomes  Pt will continue to participate in CR exercise, nutrition, and lifestyle modification opportunities.        ITP Comments: ITP Comments    Row Name 01/07/19 1329 01/13/19 1435         ITP Comments  Medical Director- Dr. Fransico Him, MD  30 Day ITP Review. Pt started exercise today and tolerated it well.          Comments: ITP Comments.

## 2019-01-24 ENCOUNTER — Ambulatory Visit (HOSPITAL_COMMUNITY): Payer: Medicare Other

## 2019-01-24 ENCOUNTER — Encounter (HOSPITAL_COMMUNITY)
Admission: RE | Admit: 2019-01-24 | Discharge: 2019-01-24 | Disposition: A | Discharge status: Home / Self Care | Payer: Medicare Other | Source: Ambulatory Visit | Attending: Cardiology | Admitting: Cardiology

## 2019-01-24 DIAGNOSIS — I5022 Chronic systolic (congestive) heart failure: Secondary | ICD-10-CM | POA: Diagnosis not present

## 2019-01-27 ENCOUNTER — Ambulatory Visit (HOSPITAL_COMMUNITY): Payer: Medicare Other

## 2019-01-27 ENCOUNTER — Encounter (HOSPITAL_COMMUNITY)
Admission: RE | Admit: 2019-01-27 | Discharge: 2019-01-27 | Disposition: A | Discharge status: Home / Self Care | Payer: Medicare Other | Source: Ambulatory Visit | Attending: Cardiology | Admitting: Cardiology

## 2019-01-27 DIAGNOSIS — I5022 Chronic systolic (congestive) heart failure: Secondary | ICD-10-CM

## 2019-01-29 ENCOUNTER — Other Ambulatory Visit: Payer: Self-pay

## 2019-01-29 ENCOUNTER — Ambulatory Visit (HOSPITAL_COMMUNITY): Payer: Medicare Other

## 2019-01-29 ENCOUNTER — Encounter (HOSPITAL_COMMUNITY)
Admission: RE | Admit: 2019-01-29 | Discharge: 2019-01-29 | Disposition: A | Discharge status: Home / Self Care | Payer: Medicare Other | Source: Ambulatory Visit | Attending: Cardiology | Admitting: Cardiology

## 2019-01-29 DIAGNOSIS — I5022 Chronic systolic (congestive) heart failure: Secondary | ICD-10-CM | POA: Diagnosis not present

## 2019-01-31 ENCOUNTER — Ambulatory Visit (HOSPITAL_COMMUNITY): Payer: Medicare Other

## 2019-01-31 ENCOUNTER — Encounter (HOSPITAL_COMMUNITY)
Admission: RE | Admit: 2019-01-31 | Discharge: 2019-01-31 | Disposition: A | Discharge status: Home / Self Care | Payer: Medicare Other | Source: Ambulatory Visit | Attending: Cardiology | Admitting: Cardiology

## 2019-01-31 ENCOUNTER — Other Ambulatory Visit: Payer: Self-pay

## 2019-01-31 DIAGNOSIS — I5022 Chronic systolic (congestive) heart failure: Secondary | ICD-10-CM | POA: Diagnosis not present

## 2019-02-03 ENCOUNTER — Encounter (HOSPITAL_COMMUNITY): Payer: Medicare Other

## 2019-02-03 ENCOUNTER — Telehealth (HOSPITAL_COMMUNITY): Payer: Self-pay | Admitting: General Practice

## 2019-02-03 ENCOUNTER — Ambulatory Visit (HOSPITAL_COMMUNITY): Payer: Medicare Other

## 2019-02-05 ENCOUNTER — Ambulatory Visit (HOSPITAL_COMMUNITY): Payer: Medicare Other

## 2019-02-05 ENCOUNTER — Encounter (HOSPITAL_COMMUNITY): Payer: Medicare Other

## 2019-02-05 NOTE — Progress Notes (Signed)
I have reviewed a Home Exercise Prescription with Nicholas Glenn . Nicholas Glenn is currently exercising at home.  The patient was advised to continue to walk 2 days a week for 30-45 minutes.  Nicholas Glenn and I discussed how to progress their exercise prescription. The patient stated that they understand the exercise prescription.  We reviewed exercise guidelines, target heart rate during exercise, RPE Scale, weather conditions, NTG use, endpoints for exercise, warmup and cool down. Patient is encouraged to come to me with any questions. I will continue to follow up with the patient to assist them with progression and safety.     Carma Lair MS, ACSM CEP 10:08 AM 02/05/2019

## 2019-02-07 ENCOUNTER — Ambulatory Visit (HOSPITAL_COMMUNITY): Payer: Medicare Other

## 2019-02-07 ENCOUNTER — Encounter (HOSPITAL_COMMUNITY): Payer: Medicare Other

## 2019-02-10 ENCOUNTER — Encounter (HOSPITAL_COMMUNITY): Payer: Medicare Other

## 2019-02-10 ENCOUNTER — Ambulatory Visit (HOSPITAL_COMMUNITY): Payer: Medicare Other

## 2019-02-12 ENCOUNTER — Encounter (HOSPITAL_COMMUNITY): Payer: Medicare Other

## 2019-02-12 ENCOUNTER — Ambulatory Visit (HOSPITAL_COMMUNITY): Payer: Medicare Other

## 2019-02-13 ENCOUNTER — Telehealth (HOSPITAL_COMMUNITY): Payer: Self-pay

## 2019-02-13 NOTE — Telephone Encounter (Signed)
Pt called regarding extended closure of Cardiac Rehab for 4 weeks due to COVID-19, tentative reopen date of 03/10/2019.   Left pt voicemail.   Carma Lair MS, ACSM CEP 2:09 PM  02/13/2019

## 2019-02-13 NOTE — Progress Notes (Signed)
Cardiac Individual Treatment Plan  Patient Details  Name: Landyn Buckalew MRN: [id number] Date of Birth: 12-Aug-1956 Referring Provider:     CARDIAC REHAB PHASE II ORIENTATION from 01/07/2019 in [facility]  Referring Provider  Dr. Aline Brochure, Dr. Radford Pax      Initial Encounter Date:    CARDIAC REHAB PHASE II ORIENTATION from 01/07/2019 in [facility]  Date  01/07/19      Visit Diagnosis: 47/0962 Chronic systolic heart failure  Patient's Home Medications on Admission:  Current Outpatient Medications:  .  aspirin 81 MG chewable tablet, Chew 81 mg by mouth daily., Disp: , Rfl:  .  doxazosin (CARDURA) 2 MG tablet, Take 2 mg by mouth daily., Disp: , Rfl:  .  hydrALAZINE (APRESOLINE) 50 MG tablet, Take 50 mg by mouth 3 (three) times daily., Disp: , Rfl:  .  isosorbide dinitrate (ISORDIL) 20 MG tablet, Take 20 mg by mouth 3 (three) times daily., Disp: , Rfl:  .  metoprolol (TOPROL-XL) 200 MG 24 hr tablet, Take 200 mg by mouth daily., Disp: , Rfl:  .  mycophenolate (CELLCEPT) 250 MG capsule, Take 750 mg by mouth 2 (two) times daily., Disp: , Rfl:  .  polycarbophil (FIBERCON) 625 MG tablet, Take 625 mg by mouth daily., Disp: , Rfl:  .  predniSONE (DELTASONE) 5 MG tablet, Take 5 mg by mouth daily with breakfast., Disp: , Rfl:  .  simvastatin (ZOCOR) 20 MG tablet, Take 20 mg by mouth daily., Disp: , Rfl:  .  tacrolimus (PROGRAF) 0.5 MG capsule, Take 0.5 mg by mouth at bedtime., Disp: , Rfl:  .  tacrolimus (PROGRAF) 1 MG capsule, Take 1 mg by mouth daily at 6 (six) AM., Disp: , Rfl:   Past Medical History: Past Medical History:  Diagnosis Date  . CHF (congestive heart failure) ([place]) 10/18/2018   Hospitalized at the [facility] [place]  . Diabetes mellitus without complication ([place])   . Hypertension   . Melanoma ([place]) 1997, [id number], 03/2015  . Polycystic kidney disease 06/11/2012    Tobacco Use: Social History   Tobacco Use   Smoking Status Never Smoker  Smokeless Tobacco Never Used    Labs: Recent Review Flowsheet Data    There is no flowsheet data to display.      Capillary Blood Glucose: Lab Results  Component Value Date   GLUCAP 83 01/15/2019   GLUCAP 113 (H) 01/15/2019   GLUCAP 111 (H) 01/13/2019   GLUCAP 147 (H) 01/13/2019     Exercise Target Goals: Exercise Program Goal: Individual exercise prescription set using results from initial 6 min walk test and THRR while considering  patient's activity barriers and safety.   Exercise Prescription Goal: Initial exercise prescription builds to 30-45 minutes a day of aerobic activity, 2-3 days per week.  Home exercise guidelines will be given to patient during program as part of exercise prescription that the participant will acknowledge.  Activity Barriers & Risk Stratification: Activity Barriers & Cardiac Risk Stratification - 01/07/19 1457      Activity Barriers & Cardiac Risk Stratification   Activity Barriers  None    Cardiac Risk Stratification  High       6 Minute Walk: 6 Minute Walk    Row Name 01/07/19 1508         6 Minute Walk   Phase  Initial     Distance  1836 feet     Walk Time  6 minutes     #  of Rest Breaks  0     MPH  3.4     METS  4.6     RPE  11     VO2 Peak  16.3     Symptoms  No     Resting HR  68 bpm     Resting BP  122/78     Resting Oxygen Saturation   97 %     Exercise Oxygen Saturation  during 6 min walk  96 %     Max Ex. HR  109 bpm     Max Ex. BP  150/80     2 Minute Post BP  126/74        Oxygen Initial Assessment:   Oxygen Re-Evaluation:   Oxygen Discharge (Final Oxygen Re-Evaluation):   Initial Exercise Prescription: Initial Exercise Prescription - 01/07/19 1500      Date of Initial Exercise RX and Referring Provider   Date  01/07/19    Referring Provider  Dr. Aline Brochure, Dr. Radford Pax    Expected Discharge Date  01/11/19      Treadmill   MPH  4.2    Grade  0    Minutes  10       Bike   Level  1.5    Minutes  10    METs  4.33      Rower   Level  3    Watts  50    Minutes  10    METs  4.5      Prescription Details   Frequency (times per week)  3    Duration  Progress to 30 minutes of continuous aerobic without signs/symptoms of physical distress      Intensity   THRR 40-80% of Max Heartrate  64-128    Ratings of Perceived Exertion  11-13      Progression   Progression  Continue to progress workloads to maintain intensity without signs/symptoms of physical distress.      Resistance Training   Training Prescription  Yes    Weight  4 lbs.     Reps  10-15       Perform Capillary Blood Glucose checks as needed.  Exercise Prescription Changes: Exercise Prescription Changes    Row Name 01/13/19 1400 01/20/19 1440 01/31/19 1444         Response to Exercise   Blood Pressure (Admit)  124/64  122/78  132/78     Blood Pressure (Exercise)  142/78  134/76  138/82     Blood Pressure (Exit)  100/78  110/78  113/68     Heart Rate (Admit)  81 bpm  81 bpm  75 bpm     Heart Rate (Exercise)  121 bpm  123 bpm  130 bpm     Heart Rate (Exit)  75 bpm  89 bpm  75 bpm     Rating of Perceived Exertion (Exercise)  11  12  12      Perceived Dyspnea (Exercise)  0  0  0     Symptoms  None  None  None     Comments  Pt oriented to exercise equipment  None  None     Duration  Progress to 30 minutes of  aerobic without signs/symptoms of physical distress  Progress to 30 minutes of  aerobic without signs/symptoms of physical distress  Progress to 30 minutes of  aerobic without signs/symptoms of physical distress     Intensity  THRR unchanged  THRR unchanged  THRR  unchanged       Progression   Progression  Continue to progress workloads to maintain intensity without signs/symptoms of physical distress.  Continue to progress workloads to maintain intensity without signs/symptoms of physical distress.  Continue to progress workloads to maintain intensity without signs/symptoms of  physical distress.     Average METs  2.32  4.54  4.97       Resistance Training   Training Prescription  Yes  Yes  Yes     Weight  4lbs  4lbs  4lbs     Reps  10-15  10-15  10-15     Time  10 Minutes  10 Minutes  10 Minutes       Treadmill   MPH  3.3  3.3  3.3     Grade  0  0  0     Minutes  10  10  10        Bike   Level  1.5  1.5  2     Minutes  10  10  10      METs  4.29  4.29  5.4       Rower   Level  3  4  4      Watts  38  62  66     Minutes  10  10  10      METs  5.1  5.8  6       Home Exercise Plan   Plans to continue exercise at  -  -  Home (comment) Walking     Frequency  -  -  Add 2 additional days to program exercise sessions.     Initial Home Exercises Provided  -  -  01/31/19        Exercise Comments: Exercise Comments    Row Name 01/13/19 1419 02/05/19 1002         Exercise Comments  Pt's first day of exercise. Pt responded well to workloads. Will continue to monitor and progress pt as tolerated.   Reviewed HEP with pt. Pt verbalized understanding. Will continue to monitor.          Exercise Goals and Review: Exercise Goals    Row Name 01/07/19 1455             Exercise Goals   Increase Physical Activity  Yes       Intervention  Provide advice, education, support and counseling about physical activity/exercise needs.;Develop an individualized exercise prescription for aerobic and resistive training based on initial evaluation findings, risk stratification, comorbidities and participant's personal goals.       Expected Outcomes  Short Term: Attend rehab on a regular basis to increase amount of physical activity.       Increase Strength and Stamina  Yes       Intervention  Provide advice, education, support and counseling about physical activity/exercise needs.;Develop an individualized exercise prescription for aerobic and resistive training based on initial evaluation findings, risk stratification, comorbidities and participant's personal goals.        Expected Outcomes  Short Term: Increase workloads from initial exercise prescription for resistance, speed, and METs.       Able to understand and use rate of perceived exertion (RPE) scale  Yes       Intervention  Provide education and explanation on how to use RPE scale       Expected Outcomes  Short Term: Able to use RPE daily in rehab to express subjective intensity level;Long  Term:  Able to use RPE to guide intensity level when exercising independently       Knowledge and understanding of Target Heart Rate Range (THRR)  Yes       Intervention  Provide education and explanation of THRR including how the numbers were predicted and where they are located for reference       Expected Outcomes  Short Term: Able to state/look up THRR;Long Term: Able to use THRR to govern intensity when exercising independently;Short Term: Able to use daily as guideline for intensity in rehab       Able to check pulse independently  Yes       Intervention  Provide education and demonstration on how to check pulse in carotid and radial arteries.;Review the importance of being able to check your own pulse for safety during independent exercise       Expected Outcomes  Short Term: Able to explain why pulse checking is important during independent exercise;Long Term: Able to check pulse independently and accurately       Understanding of Exercise Prescription  Yes       Intervention  Provide education, explanation, and written materials on patient's individual exercise prescription       Expected Outcomes  Short Term: Able to explain program exercise prescription;Long Term: Able to explain home exercise prescription to exercise independently          Exercise Goals Re-Evaluation : Exercise Goals Re-Evaluation    Row Name 02/05/19 1006 02/05/19 1010           Exercise Goal Re-Evaluation   Exercise Goals Review  Increase Physical Activity;Understanding of Exercise Prescription  Increase Physical  Activity;Understanding of Exercise Prescription      Comments  Reviewed HEP with pt. Also reviewed THRR, RPE Scale, weather conditions, endpoints of exericse, NTG use, when to call 911. Pt very receptive to information.   Reviewed HEP with pt. Also reviewed THRR, RPE Scale, weather conditions, endpoints of exericse, NTG use, when to call 911. Pt very receptive to information.       Expected Outcomes  Pt is currently walking 2 days a week for 45 minutes. Pt will continue to walk for exercise. Pt will continue to increase cardiovascular endurance. Will continue to monitor.   Pt is currently walking 2 days a week for 45 minutes. Pt will continue to walk for exercise. Pt will continue to increase cardiovascular endurance. Will continue to monitor.          Discharge Exercise Prescription (Final Exercise Prescription Changes): Exercise Prescription Changes - 01/31/19 1444      Response to Exercise   Blood Pressure (Admit)  132/78    Blood Pressure (Exercise)  138/82    Blood Pressure (Exit)  113/68    Heart Rate (Admit)  75 bpm    Heart Rate (Exercise)  130 bpm    Heart Rate (Exit)  75 bpm    Rating of Perceived Exertion (Exercise)  12    Perceived Dyspnea (Exercise)  0    Symptoms  None    Comments  None    Duration  Progress to 30 minutes of  aerobic without signs/symptoms of physical distress    Intensity  THRR unchanged      Progression   Progression  Continue to progress workloads to maintain intensity without signs/symptoms of physical distress.    Average METs  4.97      Resistance Training   Training Prescription  Yes    Weight  4lbs    Reps  10-15    Time  10 Minutes      Treadmill   MPH  3.3    Grade  0    Minutes  10      Bike   Level  2    Minutes  10    METs  5.4      Rower   Level  4    Watts  66    Minutes  10    METs  6      Home Exercise Plan   Plans to continue exercise at  Home (comment)   Walking   Frequency  Add 2 additional days to program exercise  sessions.    Initial Home Exercises Provided  01/31/19       Nutrition:  Target Goals: Understanding of nutrition guidelines, daily intake of sodium 1500mg , cholesterol 200mg , calories 30% from fat and 7% or less from saturated fats, daily to have 5 or more servings of fruits and vegetables.  Biometrics: Pre Biometrics - 01/07/19 1518      Pre Biometrics   Height  5' 9.5" (1.765 m)    Weight  85.9 kg    Waist Circumference  40.5 inches    Hip Circumference  39 inches    Waist to Hip Ratio  1.04 %    BMI (Calculated)  27.57    Triceps Skinfold  26 mm    % Body Fat  29.6 %    Grip Strength  41 kg    Flexibility  12 in    Single Leg Stand  30 seconds        Nutrition Therapy Plan and Nutrition Goals: Nutrition Therapy & Goals - 01/08/19 1504      Nutrition Therapy   Diet  heart healthy, carb modified      Personal Nutrition Goals   Nutrition Goal  Pt to build a healthy plate including vegetables, fruits, whole grains, and low-fat dairy products in a heart healthy meal plan.    Personal Goal #2  Pt to identify and limit food sources of saturated fat, trans fat, refined carbohydrates and sodium    Personal Goal #3  Pt to weigh and measure serving sizes    Personal Goal #4  Pt to eat a variety of non-starchy vegetables.      Intervention Plan   Intervention  Prescribe, educate and counsel regarding individualized specific dietary modifications aiming towards targeted core components such as weight, hypertension, lipid management, diabetes, heart failure and other comorbidities.    Expected Outcomes  Short Term Goal: Understand basic principles of dietary content, such as calories, fat, sodium, cholesterol and nutrients.;Long Term Goal: Adherence to prescribed nutrition plan.       Nutrition Assessments: Nutrition Assessments - 01/08/19 1510      MEDFICTS Scores   Pre Score  26       Nutrition Goals Re-Evaluation: Nutrition Goals Re-Evaluation    Row Name 01/08/19  1504             Goals   Current Weight  189 lb 6 oz (85.9 kg)          Nutrition Goals Re-Evaluation: Nutrition Goals Re-Evaluation    Row Name 01/08/19 1504             Goals   Current Weight  189 lb 6 oz (85.9 kg)          Nutrition Goals Discharge (Final Nutrition Goals Re-Evaluation): Nutrition Goals  Re-Evaluation - 01/08/19 1504      Goals   Current Weight  189 lb 6 oz (85.9 kg)       Psychosocial: Target Goals: Acknowledge presence or absence of significant depression and/or stress, maximize coping skills, provide positive support system. Participant is able to verbalize types and ability to use techniques and skills needed for reducing stress and depression.  Initial Review & Psychosocial Screening: Initial Psych Review & Screening - 01/07/19 [place]?  Yes       Quality of Life Scores: Quality of Life - 01/07/19 1346      Quality of Life   Select  Quality of Life      Quality of Life Scores   Health/Function Pre  29.6 %    Socioeconomic Pre  30 %    Psych/Spiritual Pre  30 %    Family Pre  30 %    GLOBAL Pre  29.82 %      Scores of 19 and below usually indicate a poorer quality of life in these areas.  A difference of  2-3 points is a clinically meaningful difference.  A difference of 2-3 points in the total score of the Quality of Life Index has been associated with significant improvement in overall quality of life, self-image, physical symptoms, and general health in studies assessing change in quality of life.  PHQ-9: Recent Review Flowsheet Data    Depression screen [facility] 2/9 01/13/2019   Decreased Interest 0   Down, Depressed, Hopeless 0   PHQ - 2 Score 0     Interpretation of Total Score  Total Score Depression Severity:  1-4 = Minimal depression, 5-9 = Mild depression, 10-14 = Moderate depression, 15-19 = Moderately severe depression, 20-27 = Severe depression   Psychosocial Evaluation and  Intervention: Psychosocial Evaluation - 01/13/19 1436      Psychosocial Evaluation & Interventions   Interventions  Encouraged to exercise with the program and follow exercise prescription    Comments  No psychosocial interventions necessary. Pt enjoys participating as a [employer].    Expected Outcomes  Damiano will continue to exhibit a positive outlook with good coping skills.     Continue Psychosocial Services   No Follow up required       Psychosocial Re-Evaluation: Psychosocial Re-Evaluation    [place] Name 02/06/19 0827             Psychosocial Re-Evaluation   Current issues with  None Identified       Comments  No psychosocial interventions necessary at this time.        Expected Outcomes  Jerrod will maintain a positive outlook with good coping skills.        Interventions  Encouraged to attend Cardiac Rehabilitation for the exercise       Continue Psychosocial Services   No Follow up required          Psychosocial Discharge (Final Psychosocial Re-Evaluation): Psychosocial Re-Evaluation - 02/06/19 0827      Psychosocial Re-Evaluation   Current issues with  None Identified    Comments  No psychosocial interventions necessary at this time.     Expected Outcomes  Linley will maintain a positive outlook with good coping skills.     Interventions  Encouraged to attend Cardiac Rehabilitation for the exercise    Continue Psychosocial Services   No Follow up required       Vocational Rehabilitation: Provide  vocational rehab assistance to qualifying candidates.   Vocational Rehab Evaluation & Intervention: Vocational Rehab - 01/07/19 1613      Initial Vocational Rehab Evaluation & Intervention   Assessment shows need for Vocational Rehabilitation  No   Abbe Amsterdam is retired and does not need vocational rehab at this time      Education: Education Goals: Education classes will be provided on a weekly basis, covering required topics. Participant will state  understanding/return demonstration of topics presented.  Learning Barriers/Preferences: Learning Barriers/Preferences - 01/07/19 1504      Learning Barriers/Preferences   Learning Barriers  Sight    Learning Preferences  Written Material       Education Topics: Count Your Pulse:  -Group instruction provided by verbal instruction, demonstration, patient participation and written materials to support subject.  Instructors address importance of being able to find your pulse and how to count your pulse when at home without a heart monitor.  Patients get hands on experience counting their pulse with staff help and individually.   CARDIAC REHAB PHASE II EXERCISE from 01/31/2019 in [place]  Date  01/17/19  Educator  RN  Instruction Review Code  2- Demonstrated Understanding      Heart Attack, Angina, and Risk Factor Modification:  -Group instruction provided by verbal instruction, video, and written materials to support subject.  Instructors address signs and symptoms of angina and heart attacks.    Also discuss risk factors for heart disease and how to make changes to improve heart health risk factors.   Functional Fitness:  -Group instruction provided by verbal instruction, demonstration, patient participation, and written materials to support subject.  Instructors address safety measures for doing things around the house.  Discuss how to get up and down off the floor, how to pick things up properly, how to safely get out of a chair without assistance, and balance training.   Meditation and Mindfulness:  -Group instruction provided by verbal instruction, patient participation, and written materials to support subject.  Instructor addresses importance of mindfulness and meditation practice to help reduce stress and improve awareness.  Instructor also leads participants through a meditation exercise.    CARDIAC REHAB PHASE II EXERCISE from 01/31/2019 in [place]  Date  01/22/19  Educator  RN  Instruction Review Code  2- Demonstrated Understanding      Stretching for Flexibility and Mobility:  -Group instruction provided by verbal instruction, patient participation, and written materials to support subject.  Instructors lead participants through series of stretches that are designed to increase flexibility thus improving mobility.  These stretches are additional exercise for major muscle groups that are typically performed during regular warm up and cool down.   Hands Only CPR:  -Group verbal, video, and participation provides a basic overview of AHA guidelines for community CPR. Role-play of emergencies allow participants the opportunity to practice calling for help and chest compression technique with discussion of AED use.   Hypertension: -Group verbal and written instruction that provides a basic overview of hypertension including the most recent diagnostic guidelines, risk factor reduction with self-care instructions and medication management.   CARDIAC REHAB PHASE II EXERCISE from 01/31/2019 in [place]  Date  01/31/19  Instruction Review Code  2- Demonstrated Understanding       Nutrition I class: Heart Healthy Eating:  -Group instruction provided by PowerPoint slides, verbal discussion, and written materials to support subject matter. The instructor gives  an explanation and review of the Therapeutic Lifestyle Changes diet recommendations, which includes a discussion on lipid goals, dietary fat, sodium, fiber, plant stanol/sterol esters, sugar, and the components of a well-balanced, healthy diet.   Nutrition II class: Lifestyle Skills:  -Group instruction provided by PowerPoint slides, verbal discussion, and written materials to support subject matter. The instructor gives an explanation and review of label reading, grocery shopping for heart health, heart healthy recipe  modifications, and ways to make healthier choices when eating out.   Diabetes Question & Answer:  -Group instruction provided by PowerPoint slides, verbal discussion, and written materials to support subject matter. The instructor gives an explanation and review of diabetes co-morbidities, pre- and post-prandial blood glucose goals, pre-exercise blood glucose goals, signs, symptoms, and treatment of hypoglycemia and hyperglycemia, and foot care basics.   Diabetes Blitz:  -Group instruction provided by PowerPoint slides, verbal discussion, and written materials to support subject matter. The instructor gives an explanation and review of the physiology behind type 1 and type 2 diabetes, diabetes medications and rational behind using different medications, pre- and post-prandial blood glucose recommendations and Hemoglobin A1c goals, diabetes diet, and exercise including blood glucose guidelines for exercising safely.    Portion Distortion:  -Group instruction provided by PowerPoint slides, verbal discussion, written materials, and food models to support subject matter. The instructor gives an explanation of serving size versus portion size, changes in portions sizes over the last 20 years, and what consists of a serving from each food group.   Stress Management:  -Group instruction provided by verbal instruction, video, and written materials to support subject matter.  Instructors review role of stress in heart disease and how to cope with stress positively.     Exercising on Your Own:  -Group instruction provided by verbal instruction, power point, and written materials to support subject.  Instructors discuss benefits of exercise, components of exercise, frequency and intensity of exercise, and end points for exercise.  Also discuss use of nitroglycerin and activating EMS.  Review options of places to exercise outside of rehab.  Review guidelines for sex with heart disease.   CARDIAC REHAB PHASE  II EXERCISE from 01/31/2019 in [place]  Date  01/15/19  Educator  EP  Instruction Review Code  2- Demonstrated Understanding      Cardiac Drugs I:  -Group instruction provided by verbal instruction and written materials to support subject.  Instructor reviews cardiac drug classes: antiplatelets, anticoagulants, beta blockers, and statins.  Instructor discusses reasons, side effects, and lifestyle considerations for each drug class.   Cardiac Drugs II:  -Group instruction provided by verbal instruction and written materials to support subject.  Instructor reviews cardiac drug classes: angiotensin converting enzyme inhibitors (ACE-I), angiotensin II receptor blockers (ARBs), nitrates, and calcium channel blockers.  Instructor discusses reasons, side effects, and lifestyle considerations for each drug class.   Anatomy and Physiology of the Circulatory System:  Group verbal and written instruction and models provide basic cardiac anatomy and physiology, with the coronary electrical and arterial systems. Review of: AMI, Angina, Valve disease, Heart Failure, Peripheral Artery Disease, Cardiac Arrhythmia, Pacemakers, and the ICD.   Other Education:  -Group or individual verbal, written, or video instructions that support the educational goals of the cardiac rehab program.   Holiday Eating Survival Tips:  -Group instruction provided by PowerPoint slides, verbal discussion, and written materials to support subject matter. The instructor gives patients tips, tricks, and techniques to help them not only survive  but enjoy the holidays despite the onslaught of food that accompanies the holidays.   Knowledge Questionnaire Score: Knowledge Questionnaire Score - 01/07/19 1347      Knowledge Questionnaire Score   Pre Score  23/24       Core Components/Risk Factors/Patient Goals at Admission: Personal Goals and Risk Factors at Admission - 01/07/19 1500      Core  Components/Risk Factors/Patient Goals on Admission    Weight Management  Yes;Weight Maintenance    Intervention  Weight Management: Develop a combined nutrition and exercise program designed to reach desired caloric intake, while maintaining appropriate intake of nutrient and fiber, sodium and fats, and appropriate energy expenditure required for the weight goal.;Weight Management: Provide education and appropriate resources to help participant work on and attain dietary goals.    Admit Weight  189 lb 6 oz (85.9 kg)    Expected Outcomes  Short Term: Continue to assess and modify interventions until short term weight is achieved;Long Term: Adherence to nutrition and physical activity/exercise program aimed toward attainment of established weight goal;Weight Maintenance: Understanding of the daily nutrition guidelines, which includes 25-35% calories from fat, 7% or less cal from saturated fats, less than 200mg  cholesterol, less than 1.5gm of sodium, & 5 or more servings of fruits and vegetables daily;Understanding recommendations for meals to include 15-35% energy as protein, 25-35% energy from fat, 35-60% energy from carbohydrates, less than 200mg  of dietary cholesterol, 20-35 gm of total fiber daily;Understanding of distribution of calorie intake throughout the day with the consumption of 4-5 meals/snacks    Diabetes  Yes    Intervention  Provide education about signs/symptoms and action to take for hypo/hyperglycemia.;Provide education about proper nutrition, including hydration, and aerobic/resistive exercise prescription along with prescribed medications to achieve blood glucose in normal ranges: Fasting glucose 65-99 mg/dL    Expected Outcomes  Short Term: Participant verbalizes understanding of the signs/symptoms and immediate care of hyper/hypoglycemia, proper foot care and importance of medication, aerobic/resistive exercise and nutrition plan for blood glucose control.;Long Term: Attainment of HbA1C <  7%.    Heart Failure  Yes    Intervention  Provide a combined exercise and nutrition program that is supplemented with education, support and counseling about heart failure. Directed toward relieving symptoms such as shortness of breath, decreased exercise tolerance, and extremity edema.    Expected Outcomes  Improve functional capacity of life;Short term: Attendance in program 2-3 days a week with increased exercise capacity. Reported lower sodium intake. Reported increased fruit and vegetable intake. Reports medication compliance.;Short term: Daily weights obtained and reported for increase. Utilizing diuretic protocols set by physician.;Long term: Adoption of self-care skills and reduction of barriers for early signs and symptoms recognition and intervention leading to self-care maintenance.    Hypertension  Yes    Intervention  Provide education on lifestyle modifcations including regular physical activity/exercise, weight management, moderate sodium restriction and increased consumption of fresh fruit, vegetables, and low fat dairy, alcohol moderation, and smoking cessation.;Monitor prescription use compliance.    Expected Outcomes  Short Term: Continued assessment and intervention until BP is < 140/74mm HG in hypertensive participants. < 130/6mm HG in hypertensive participants with diabetes, heart failure or chronic kidney disease.;Long Term: Maintenance of blood pressure at goal levels.    Lipids  Yes    Intervention  Provide education and support for participant on nutrition & aerobic/resistive exercise along with prescribed medications to achieve LDL 70mg , HDL >40mg .    Expected Outcomes  Short Term: Participant states understanding of desired  cholesterol values and is compliant with medications prescribed. Participant is following exercise prescription and nutrition guidelines.;Long Term: Cholesterol controlled with medications as prescribed, with individualized exercise RX and with personalized  nutrition plan. Value goals: LDL < 70mg , HDL > 40 mg.    Stress  Yes    Intervention  Offer individual and/or small group education and counseling on adjustment to heart disease, stress management and health-related lifestyle change. Teach and support self-help strategies.;Refer participants experiencing significant psychosocial distress to appropriate mental health specialists for further evaluation and treatment. When possible, include family members and significant others in education/counseling sessions.    Expected Outcomes  Short Term: Participant demonstrates changes in health-related behavior, relaxation and other stress management skills, ability to obtain effective social support, and compliance with psychotropic medications if prescribed.;Long Term: Emotional wellbeing is indicated by absence of clinically significant psychosocial distress or social isolation.       Core Components/Risk Factors/Patient Goals Review:  Goals and Risk Factor Review    Row Name 01/13/19 1442 02/06/19 0828           Core Components/Risk Factors/Patient Goals Review   Personal Goals Review  Weight Management/Obesity;Heart Failure;Lipids;Hypertension;Diabetes  Weight Management/Obesity;Heart Failure;Lipids;Hypertension;Diabetes      Review  Pt with multiple CAD RFs willing to participate in CR exercise.  Kaiyan would like to lose 10 lbs and establish a good exercise routine.  Pt continues to tolerate exercise well.  Exercise currently on hold as department is closed per recommended guidelines from federal government to prevent spread of COVID-19.       Expected Outcomes  Pt will continue to participate in CR exercise, nutrition, and lifestyle modification opportunities.   Pt will continue to participate in CR exercise, nutrition, and lifestyle modification opportunities.          Core Components/Risk Factors/Patient Goals at Discharge (Final Review):  Goals and Risk Factor Review - 02/06/19 0828       Core Components/Risk Factors/Patient Goals Review   Personal Goals Review  Weight Management/Obesity;Heart Failure;Lipids;Hypertension;Diabetes    Review  Pt continues to tolerate exercise well.  Exercise currently on hold as department is closed per recommended guidelines from federal government to prevent spread of COVID-19.     Expected Outcomes  Pt will continue to participate in CR exercise, nutrition, and lifestyle modification opportunities.        ITP Comments: ITP Comments    Row Name 01/07/19 1329 01/13/19 1435 02/06/19 0827       ITP Comments  Medical Director- Dr. Fransico Him, MD  30 Day ITP Review. Pt started exercise today and tolerated it well.   30 Day ITP Review. Pt continues to tolerate exercise well.  Exercise currently on hold as department is closed per recommended guidelines from federal government to prevent spread of COVID-19.         Comments: See ITP Comments.

## 2019-02-14 ENCOUNTER — Encounter (HOSPITAL_COMMUNITY): Payer: Medicare Other

## 2019-02-14 ENCOUNTER — Ambulatory Visit (HOSPITAL_COMMUNITY): Payer: Medicare Other

## 2019-02-17 ENCOUNTER — Ambulatory Visit (HOSPITAL_COMMUNITY): Payer: Medicare Other

## 2019-02-17 ENCOUNTER — Encounter (HOSPITAL_COMMUNITY): Payer: Medicare Other

## 2019-02-19 ENCOUNTER — Ambulatory Visit (HOSPITAL_COMMUNITY): Payer: Medicare Other

## 2019-02-19 ENCOUNTER — Encounter (HOSPITAL_COMMUNITY): Payer: Medicare Other

## 2019-02-21 ENCOUNTER — Ambulatory Visit (HOSPITAL_COMMUNITY): Payer: Medicare Other

## 2019-02-21 ENCOUNTER — Encounter (HOSPITAL_COMMUNITY): Payer: Medicare Other

## 2019-02-24 ENCOUNTER — Ambulatory Visit (HOSPITAL_COMMUNITY): Payer: Medicare Other

## 2019-02-24 ENCOUNTER — Encounter (HOSPITAL_COMMUNITY): Payer: Medicare Other

## 2019-02-26 ENCOUNTER — Telehealth (HOSPITAL_COMMUNITY): Payer: Self-pay | Admitting: *Deleted

## 2019-02-26 ENCOUNTER — Encounter (HOSPITAL_COMMUNITY): Payer: Medicare Other

## 2019-02-26 ENCOUNTER — Ambulatory Visit (HOSPITAL_COMMUNITY): Payer: Medicare Other

## 2019-02-26 NOTE — Telephone Encounter (Signed)
Called to notify patient that the cardiac and pulmonary rehabilitation department remains closed at this time due to COVID-19 restrictions. Message left on voicemail.   Sol Passer, MS, ACSM CEP 02/26/2019 1154

## 2019-02-28 ENCOUNTER — Ambulatory Visit (HOSPITAL_COMMUNITY): Payer: Medicare Other

## 2019-02-28 ENCOUNTER — Encounter (HOSPITAL_COMMUNITY): Payer: Medicare Other

## 2019-03-03 ENCOUNTER — Encounter (HOSPITAL_COMMUNITY): Payer: Medicare Other

## 2019-03-03 ENCOUNTER — Ambulatory Visit (HOSPITAL_COMMUNITY): Payer: Medicare Other

## 2019-03-05 ENCOUNTER — Ambulatory Visit (HOSPITAL_COMMUNITY): Payer: Medicare Other

## 2019-03-05 ENCOUNTER — Encounter (HOSPITAL_COMMUNITY): Payer: Medicare Other

## 2019-03-07 ENCOUNTER — Ambulatory Visit (HOSPITAL_COMMUNITY): Payer: Medicare Other

## 2019-03-07 ENCOUNTER — Encounter (HOSPITAL_COMMUNITY): Payer: Medicare Other

## 2019-03-10 ENCOUNTER — Ambulatory Visit (HOSPITAL_COMMUNITY): Payer: Medicare Other

## 2019-03-10 ENCOUNTER — Encounter (HOSPITAL_COMMUNITY): Payer: Medicare Other

## 2019-03-12 ENCOUNTER — Encounter (HOSPITAL_COMMUNITY): Payer: Medicare Other

## 2019-03-12 ENCOUNTER — Ambulatory Visit (HOSPITAL_COMMUNITY): Payer: Medicare Other

## 2019-03-14 ENCOUNTER — Ambulatory Visit (HOSPITAL_COMMUNITY): Payer: Medicare Other

## 2019-03-14 ENCOUNTER — Encounter (HOSPITAL_COMMUNITY): Payer: Medicare Other

## 2019-03-17 ENCOUNTER — Encounter (HOSPITAL_COMMUNITY): Payer: Medicare Other

## 2019-03-17 ENCOUNTER — Ambulatory Visit (HOSPITAL_COMMUNITY): Payer: Medicare Other

## 2019-03-19 ENCOUNTER — Ambulatory Visit (HOSPITAL_COMMUNITY): Payer: Medicare Other

## 2019-03-19 ENCOUNTER — Encounter (HOSPITAL_COMMUNITY): Payer: Medicare Other

## 2019-03-21 ENCOUNTER — Ambulatory Visit (HOSPITAL_COMMUNITY): Payer: Medicare Other

## 2019-03-21 ENCOUNTER — Encounter (HOSPITAL_COMMUNITY): Payer: Medicare Other

## 2019-03-24 ENCOUNTER — Ambulatory Visit (HOSPITAL_COMMUNITY): Payer: Medicare Other

## 2019-03-24 ENCOUNTER — Encounter (HOSPITAL_COMMUNITY): Payer: Medicare Other

## 2019-03-26 ENCOUNTER — Ambulatory Visit (HOSPITAL_COMMUNITY): Payer: Medicare Other

## 2019-03-26 ENCOUNTER — Encounter (HOSPITAL_COMMUNITY): Payer: Medicare Other

## 2019-03-28 ENCOUNTER — Ambulatory Visit (HOSPITAL_COMMUNITY): Payer: Medicare Other

## 2019-03-28 ENCOUNTER — Encounter (HOSPITAL_COMMUNITY): Payer: Medicare Other

## 2019-03-31 ENCOUNTER — Ambulatory Visit (HOSPITAL_COMMUNITY): Payer: Medicare Other

## 2019-03-31 ENCOUNTER — Encounter (HOSPITAL_COMMUNITY): Payer: Medicare Other

## 2019-04-02 ENCOUNTER — Ambulatory Visit (HOSPITAL_COMMUNITY): Payer: Medicare Other

## 2019-04-02 ENCOUNTER — Encounter (HOSPITAL_COMMUNITY): Payer: Medicare Other

## 2019-04-04 ENCOUNTER — Encounter (HOSPITAL_COMMUNITY): Payer: Medicare Other

## 2019-04-04 ENCOUNTER — Ambulatory Visit (HOSPITAL_COMMUNITY): Payer: Medicare Other

## 2019-04-07 ENCOUNTER — Encounter (HOSPITAL_COMMUNITY): Payer: Medicare Other

## 2019-04-07 ENCOUNTER — Ambulatory Visit (HOSPITAL_COMMUNITY): Payer: Medicare Other

## 2019-04-09 ENCOUNTER — Encounter (HOSPITAL_COMMUNITY): Payer: Medicare Other

## 2019-04-09 ENCOUNTER — Ambulatory Visit (HOSPITAL_COMMUNITY): Payer: Medicare Other

## 2019-04-11 ENCOUNTER — Ambulatory Visit (HOSPITAL_COMMUNITY): Payer: Medicare Other

## 2019-04-11 ENCOUNTER — Encounter (HOSPITAL_COMMUNITY): Payer: Medicare Other

## 2019-04-16 ENCOUNTER — Ambulatory Visit (HOSPITAL_COMMUNITY): Payer: Medicare Other

## 2019-04-16 ENCOUNTER — Encounter (HOSPITAL_COMMUNITY): Payer: Medicare Other

## 2019-04-17 ENCOUNTER — Telehealth (HOSPITAL_COMMUNITY): Payer: Self-pay

## 2019-04-17 NOTE — Telephone Encounter (Signed)
Phone call made to Pt to provide information about virtual cardiac rehab. Pt was responsive and wanted to move forward with virtual rehab. Pt was informed he would receive a text message with instructions on setting up the application.

## 2019-04-18 ENCOUNTER — Ambulatory Visit (HOSPITAL_COMMUNITY): Payer: Medicare Other

## 2019-04-18 ENCOUNTER — Encounter (HOSPITAL_COMMUNITY): Payer: Medicare Other

## 2019-04-21 ENCOUNTER — Encounter (HOSPITAL_COMMUNITY): Payer: TRICARE For Life (TFL)

## 2019-04-21 ENCOUNTER — Ambulatory Visit (HOSPITAL_COMMUNITY): Payer: Medicare Other

## 2019-04-21 ENCOUNTER — Telehealth (HOSPITAL_COMMUNITY): Payer: Self-pay

## 2019-04-21 NOTE — Telephone Encounter (Signed)
Attempted to contact pt to schedule a telephone visit for our Virtual Cardiac Rehab, LMTCB. °

## 2019-05-27 NOTE — Progress Notes (Signed)
Discharge Progress Report  Patient Details  Name: Nicholas Glenn MRN: [id number] Date of Birth: 06-05-1956 Referring Provider:     [facility] from 01/07/2019 in [facility]  Referring Provider  Dr. Aline Brochure, Dr. Radford Pax       Number of Visits: 10  Reason for Discharge:  Early Exit:  unable to contact pt.  Smoking History:  Social History   Tobacco Use  Smoking Status Never Smoker  Smokeless Tobacco Never Used    Diagnosis:  33/0076 Chronic systolic heart failure  ADL [facility]:   Initial Exercise Prescription: Initial Exercise Prescription - 01/07/19 1500      Date of Initial Exercise RX and Referring Provider   Date  01/07/19    Referring Provider  Dr. Aline Brochure, Dr. Radford Pax    Expected Discharge Date  01/11/19      Treadmill   MPH  4.2    Grade  0    Minutes  10      Bike   Level  1.5    Minutes  10    METs  4.33      Rower   Level  3    Watts  50    Minutes  10    METs  4.5      Prescription Details   Frequency (times per week)  3    Duration  Progress to 30 minutes of continuous aerobic without signs/symptoms of physical distress      Intensity   THRR 40-80% of Max Heartrate  64-128    Ratings of Perceived Exertion  11-13      Progression   Progression  Continue to progress workloads to maintain intensity without signs/symptoms of physical distress.      Resistance Training   Training Prescription  Yes    Weight  4 lbs.     Reps  10-15       Discharge Exercise Prescription (Final Exercise Prescription Changes): Exercise Prescription Changes - 01/31/19 1444      Response to Exercise   Blood Pressure (Admit)  132/78    Blood Pressure (Exercise)  138/82    Blood Pressure (Exit)  113/68    Heart Rate (Admit)  75 bpm    Heart Rate (Exercise)  130 bpm    Heart Rate (Exit)  75 bpm    Rating of Perceived Exertion (Exercise)  12    Perceived Dyspnea (Exercise)  0    Symptoms  None    Comments   None    Duration  Progress to 30 minutes of  aerobic without signs/symptoms of physical distress    Intensity  THRR unchanged      Progression   Progression  Continue to progress workloads to maintain intensity without signs/symptoms of physical distress.    Average METs  4.97      Resistance Training   Training Prescription  Yes    Weight  4lbs    Reps  10-15    Time  10 Minutes      Treadmill   MPH  3.3    Grade  0    Minutes  10      Bike   Level  2    Minutes  10    METs  5.4      Rower   Level  4    Watts  66    Minutes  10    METs  6      Home  Exercise Plan   Plans to continue exercise at  Home (comment)   Walking   Frequency  Add 2 additional days to program exercise sessions.    Initial Home Exercises Provided  01/31/19       Functional Capacity: 6 Minute Walk    Row Name 01/07/19 1508         6 Minute Walk   Phase  Initial     Distance  1836 feet     Walk Time  6 minutes     # of Rest Breaks  0     MPH  3.4     METS  4.6     RPE  11     VO2 Peak  16.3     Symptoms  No     Resting HR  68 bpm     Resting BP  122/78     Resting Oxygen Saturation   97 %     Exercise Oxygen Saturation  during 6 min walk  96 %     Max Ex. HR  109 bpm     Max Ex. BP  150/80     2 Minute Post BP  126/74        Psychological, QOL, Others - Outcomes: PHQ 2/9: Depression screen PHQ 2/9 01/13/2019  Decreased Interest 0  Down, Depressed, Hopeless 0  PHQ - 2 Score 0    Quality of Life: Quality of Life - 01/07/19 1346      Quality of Life   Select  Quality of Life      Quality of Life Scores   Health/Function Pre  29.6 %    Socioeconomic Pre  30 %    Psych/Spiritual Pre  30 %    Family Pre  30 %    GLOBAL Pre  29.82 %       Personal Goals: Goals established at orientation with interventions provided to work toward goal. Personal Goals and Risk Factors at Admission - 01/07/19 1500      Core Components/Risk Factors/Patient Goals on Admission     Weight Management  Yes;Weight Maintenance    Intervention  Weight Management: Develop a combined nutrition and exercise program designed to reach desired caloric intake, while maintaining appropriate intake of nutrient and fiber, sodium and fats, and appropriate energy expenditure required for the weight goal.;Weight Management: Provide education and appropriate resources to help participant work on and attain dietary goals.    Admit Weight  189 lb 6 oz (85.9 kg)    Expected Outcomes  Short Term: Continue to assess and modify interventions until short term weight is achieved;Long Term: Adherence to nutrition and physical activity/exercise program aimed toward attainment of established weight goal;Weight Maintenance: Understanding of the daily nutrition guidelines, which includes 25-35% calories from fat, 7% or less cal from saturated fats, less than 200mg  cholesterol, less than 1.5gm of sodium, & 5 or more servings of fruits and vegetables daily;Understanding recommendations for meals to include 15-35% energy as protein, 25-35% energy from fat, 35-60% energy from carbohydrates, less than 200mg  of dietary cholesterol, 20-35 gm of total fiber daily;Understanding of distribution of calorie intake throughout the day with the consumption of 4-5 meals/snacks    Diabetes  Yes    Intervention  Provide education about signs/symptoms and action to take for hypo/hyperglycemia.;Provide education about proper nutrition, including hydration, and aerobic/resistive exercise prescription along with prescribed medications to achieve blood glucose in normal ranges: Fasting glucose 65-99 mg/dL    Expected Outcomes  Short  Term: Participant verbalizes understanding of the signs/symptoms and immediate care of hyper/hypoglycemia, proper foot care and importance of medication, aerobic/resistive exercise and nutrition plan for blood glucose control.;Long Term: Attainment of HbA1C < 7%.    Heart Failure  Yes    Intervention  Provide a  combined exercise and nutrition program that is supplemented with education, support and counseling about heart failure. Directed toward relieving symptoms such as shortness of breath, decreased exercise tolerance, and extremity edema.    Expected Outcomes  Improve functional capacity of life;Short term: Attendance in program 2-3 days a week with increased exercise capacity. Reported lower sodium intake. Reported increased fruit and vegetable intake. Reports medication compliance.;Short term: Daily weights obtained and reported for increase. Utilizing diuretic protocols set by physician.;Long term: Adoption of self-care skills and reduction of barriers for early signs and symptoms recognition and intervention leading to self-care maintenance.    Hypertension  Yes    Intervention  Provide education on lifestyle modifcations including regular physical activity/exercise, weight management, moderate sodium restriction and increased consumption of fresh fruit, vegetables, and low fat dairy, alcohol moderation, and smoking cessation.;Monitor prescription use compliance.    Expected Outcomes  Short Term: Continued assessment and intervention until BP is < 140/79mm HG in hypertensive participants. < 130/52mm HG in hypertensive participants with diabetes, heart failure or chronic kidney disease.;Long Term: Maintenance of blood pressure at goal levels.    Lipids  Yes    Intervention  Provide education and support for participant on nutrition & aerobic/resistive exercise along with prescribed medications to achieve LDL 70mg , HDL >40mg .    Expected Outcomes  Short Term: Participant states understanding of desired cholesterol values and is compliant with medications prescribed. Participant is following exercise prescription and nutrition guidelines.;Long Term: Cholesterol controlled with medications as prescribed, with individualized exercise RX and with personalized nutrition plan. Value goals: LDL < 70mg , HDL > 40 mg.     Stress  Yes    Intervention  Offer individual and/or small group education and counseling on adjustment to heart disease, stress management and health-related lifestyle change. Teach and support self-help strategies.;Refer participants experiencing significant psychosocial distress to appropriate mental health specialists for further evaluation and treatment. When possible, include family members and significant others in education/counseling sessions.    Expected Outcomes  Short Term: Participant demonstrates changes in health-related behavior, relaxation and other stress management skills, ability to obtain effective social support, and compliance with psychotropic medications if prescribed.;Long Term: Emotional wellbeing is indicated by absence of clinically significant psychosocial distress or social isolation.        Personal Goals Discharge: Goals and Risk Factor Review    Row Name 01/13/19 1442 02/06/19 1610           Core Components/Risk Factors/Patient Goals Review   Personal Goals Review  Weight Management/Obesity;Heart Failure;Lipids;Hypertension;Diabetes  Weight Management/Obesity;Heart Failure;Lipids;Hypertension;Diabetes      Review  Pt with multiple CAD RFs willing to participate in CR exercise.  Kaya would like to lose 10 lbs and establish a good exercise routine.  Pt continues to tolerate exercise well.  Exercise currently on hold as department is closed per recommended guidelines from federal government to prevent spread of COVID-19.       Expected Outcomes  Pt will continue to participate in CR exercise, nutrition, and lifestyle modification opportunities.   Pt will continue to participate in CR exercise, nutrition, and lifestyle modification opportunities.          Exercise Goals and Review: Exercise Goals  [place] Name 01/07/19 1455             Exercise Goals   Increase Physical Activity  Yes       Intervention  Provide advice, education, support and counseling  about physical activity/exercise needs.;Develop an individualized exercise prescription for aerobic and resistive training based on initial evaluation findings, risk stratification, comorbidities and participant's personal goals.       Expected Outcomes  Short Term: Attend rehab on a regular basis to increase amount of physical activity.       Increase Strength and Stamina  Yes       Intervention  Provide advice, education, support and counseling about physical activity/exercise needs.;Develop an individualized exercise prescription for aerobic and resistive training based on initial evaluation findings, risk stratification, comorbidities and participant's personal goals.       Expected Outcomes  Short Term: Increase workloads from initial exercise prescription for resistance, speed, and METs.       Able to understand and use rate of perceived exertion (RPE) scale  Yes       Intervention  Provide education and explanation on how to use RPE scale       Expected Outcomes  Short Term: Able to use RPE daily in rehab to express subjective intensity level;Long Term:  Able to use RPE to guide intensity level when exercising independently       Knowledge and understanding of Target Heart Rate Range (THRR)  Yes       Intervention  Provide education and explanation of THRR including how the numbers were predicted and where they are located for reference       Expected Outcomes  Short Term: Able to state/look up THRR;Long Term: Able to use THRR to govern intensity when exercising independently;Short Term: Able to use daily as guideline for intensity in rehab       Able to check pulse independently  Yes       Intervention  Provide education and demonstration on how to check pulse in carotid and radial arteries.;Review the importance of being able to check your own pulse for safety during independent exercise       Expected Outcomes  Short Term: Able to explain why pulse checking is important during independent  exercise;Long Term: Able to check pulse independently and accurately       Understanding of Exercise Prescription  Yes       Intervention  Provide education, explanation, and written materials on patient's individual exercise prescription       Expected Outcomes  Short Term: Able to explain program exercise prescription;Long Term: Able to explain home exercise prescription to exercise independently          Exercise Goals Re-Evaluation: Exercise Goals Re-Evaluation    Row Name 02/05/19 1006 02/05/19 1010           Exercise Goal Re-Evaluation   Exercise Goals Review  Increase Physical Activity;Understanding of Exercise Prescription  Increase Physical Activity;Understanding of Exercise Prescription      Comments  Reviewed HEP with pt. Also reviewed THRR, RPE Scale, weather conditions, endpoints of exericse, NTG use, when to call 911. Pt very receptive to information.   Reviewed HEP with pt. Also reviewed THRR, RPE Scale, weather conditions, endpoints of exericse, NTG use, when to call 911. Pt very receptive to information.       Expected Outcomes  Pt is currently walking 2 days a week for 45 minutes. Pt will continue to walk for exercise. Pt will continue  to increase cardiovascular endurance. Will continue to monitor.   Pt is currently walking 2 days a week for 45 minutes. Pt will continue to walk for exercise. Pt will continue to increase cardiovascular endurance. Will continue to monitor.          Nutrition & Weight - Outcomes: Pre Biometrics - 01/07/19 1518      Pre Biometrics   Height  5' 9.5" (1.765 m)    Weight  85.9 kg    Waist Circumference  40.5 inches    Hip Circumference  39 inches    Waist to Hip Ratio  1.04 %    BMI (Calculated)  27.57    Triceps Skinfold  26 mm    % Body Fat  29.6 %    Grip Strength  41 kg    Flexibility  12 in    Single Leg Stand  30 seconds        Nutrition: Nutrition Therapy & Goals - 01/08/19 1504      Nutrition Therapy   Diet  heart  healthy, carb modified      Personal Nutrition Goals   Nutrition Goal  Pt to build a healthy plate including vegetables, fruits, whole grains, and low-fat dairy products in a heart healthy meal plan.    Personal Goal #2  Pt to identify and limit food sources of saturated fat, trans fat, refined carbohydrates and sodium    Personal Goal #3  Pt to weigh and measure serving sizes    Personal Goal #4  Pt to eat a variety of non-starchy vegetables.      Intervention Plan   Intervention  Prescribe, educate and counsel regarding individualized specific dietary modifications aiming towards targeted core components such as weight, hypertension, lipid management, diabetes, heart failure and other comorbidities.    Expected Outcomes  Short Term Goal: Understand basic principles of dietary content, such as calories, fat, sodium, cholesterol and nutrients.;Long Term Goal: Adherence to prescribed nutrition plan.       Nutrition Discharge: Nutrition Assessments - 01/08/19 1510      MEDFICTS Scores   Pre Score  26       Education Questionnaire Score: Knowledge Questionnaire Score - 01/07/19 1347      Knowledge Questionnaire Score   Pre Score  23/24       Goals reviewed with patient; copy given to patient.

## 2019-05-27 NOTE — Addendum Note (Signed)
Encounter addended by: Noel Christmas, RN on: 05/27/2019 9:02 AM  Actions taken: Clinical Note Signed

## 2021-03-09 ENCOUNTER — Other Ambulatory Visit: Payer: Self-pay

## 2021-03-09 ENCOUNTER — Emergency Department (HOSPITAL_COMMUNITY): Payer: Medicare Other

## 2021-03-09 ENCOUNTER — Emergency Department (HOSPITAL_COMMUNITY)
Admission: EM | Admit: 2021-03-09 | Discharge: 2021-03-09 | Disposition: A | Discharge status: Home / Self Care | Payer: Medicare Other | Attending: Emergency Medicine | Admitting: Emergency Medicine

## 2021-03-09 DIAGNOSIS — Z79899 Other long term (current) drug therapy: Secondary | ICD-10-CM | POA: Insufficient documentation

## 2021-03-09 DIAGNOSIS — X58XXXA Exposure to other specified factors, initial encounter: Secondary | ICD-10-CM | POA: Diagnosis not present

## 2021-03-09 DIAGNOSIS — N189 Chronic kidney disease, unspecified: Secondary | ICD-10-CM | POA: Insufficient documentation

## 2021-03-09 DIAGNOSIS — I509 Heart failure, unspecified: Secondary | ICD-10-CM | POA: Diagnosis not present

## 2021-03-09 DIAGNOSIS — T82838A Hemorrhage of vascular prosthetic devices, implants and grafts, initial encounter: Secondary | ICD-10-CM | POA: Diagnosis present

## 2021-03-09 DIAGNOSIS — Z9889 Other specified postprocedural states: Secondary | ICD-10-CM | POA: Insufficient documentation

## 2021-03-09 DIAGNOSIS — Z7901 Long term (current) use of anticoagulants: Secondary | ICD-10-CM | POA: Diagnosis not present

## 2021-03-09 DIAGNOSIS — I110 Hypertensive heart disease with heart failure: Secondary | ICD-10-CM | POA: Diagnosis not present

## 2021-03-09 DIAGNOSIS — Z85820 Personal history of malignant melanoma of skin: Secondary | ICD-10-CM | POA: Insufficient documentation

## 2021-03-09 DIAGNOSIS — Z5189 Encounter for other specified aftercare: Secondary | ICD-10-CM

## 2021-03-09 DIAGNOSIS — Z940 Kidney transplant status: Secondary | ICD-10-CM | POA: Diagnosis not present

## 2021-03-09 DIAGNOSIS — Z794 Long term (current) use of insulin: Secondary | ICD-10-CM | POA: Insufficient documentation

## 2021-03-09 LAB — CBC WITH DIFFERENTIAL/PLATELET
Abs Immature Granulocytes: 0.11 10*3/uL — ABNORMAL HIGH (ref 0.00–0.07)
Basophils Absolute: 0.1 10*3/uL (ref 0.0–0.1)
Basophils Relative: 0 %
Eosinophils Absolute: 0.3 10*3/uL (ref 0.0–0.5)
Eosinophils Relative: 2 %
HCT: 34.2 % — ABNORMAL LOW (ref 39.0–52.0)
Hemoglobin: 10.4 g/dL — ABNORMAL LOW (ref 13.0–17.0)
Immature Granulocytes: 1 %
Lymphocytes Relative: 2 %
Lymphs Abs: 0.4 10*3/uL — ABNORMAL LOW (ref 0.7–4.0)
MCH: 27.8 pg (ref 26.0–34.0)
MCHC: 30.4 g/dL (ref 30.0–36.0)
MCV: 91.4 fL (ref 80.0–100.0)
Monocytes Absolute: 1.3 10*3/uL — ABNORMAL HIGH (ref 0.1–1.0)
Monocytes Relative: 7 %
Neutro Abs: 16.4 10*3/uL — ABNORMAL HIGH (ref 1.7–7.7)
Neutrophils Relative %: 88 %
Platelets: 165 10*3/uL (ref 150–400)
RBC: 3.74 MIL/uL — ABNORMAL LOW (ref 4.22–5.81)
RDW: 15.5 % (ref 11.5–15.5)
WBC: 18.5 10*3/uL — ABNORMAL HIGH (ref 4.0–10.5)
nRBC: 0 % (ref 0.0–0.2)

## 2021-03-09 LAB — PROTIME-INR
INR: 1.3 — ABNORMAL HIGH (ref 0.8–1.2)
Prothrombin Time: 16 seconds — ABNORMAL HIGH (ref 11.4–15.2)

## 2021-03-09 LAB — APTT: aPTT: 33 seconds (ref 24–36)

## 2021-03-09 NOTE — ED Triage Notes (Incomplete)
Pt here from home with c/o a bleeding vas cath that was placed at [facility] ,pt held his blood thinner today had full dialysis yesterday

## 2021-03-09 NOTE — Discharge Instructions (Signed)
There does not appear to be any significant ongoing bleeding from the catheter site, on your chest.  Keep an eye on things, for worsening bleeding or development of other symptoms such as pain.  Try to avoid exertion or walking today.  You can consider continuing your Eliquis either at full dose, or half dose for a few days, which could help prevent some bleeding.  It appears that you are in sinus rhythm at this time.  Return here if needed for problems.  Have a dialysis team check the catheter site, tomorrow.  They might want to check your blood count again.

## 2021-03-09 NOTE — ED Triage Notes (Signed)
Emergency Medicine Provider Triage Evaluation Note  Nicholas Glenn , a 65 y.o. male  was evaluated in triage.  Pt complains of bleeding around dialysis catheter.  Patient had Vas-Cath placed for dialysis with [facility] intervention[facility]ology on Friday morning.  Initially had no complications with chest catheter and last had dialysis treatment yesterday and it functioned properly.  He reports this afternoon he started to notice some bleeding from around the catheter site, he is unsure if it got pulled or moved at all, denies pain or myalgias.  Is currently on Eliquis.  Review of Systems  Positive: Bleeding Negative: Chest pain, shortness of breath, fever  Physical Exam  BP 139/89   Pulse 96   Temp 98.6 F (37 C) (Oral)   Resp 16   SpO2 100%  Gen:   Awake, no distress   HEENT:  Atraumatic  Resp:  Normal effort, Vas-Cath present right upper chest with dried blood present around catheter site no active bleeding currently Cardiac:  Normal rate  Abd:   Nondistended, nontender  MSK:   Moves extremities without difficulty  Neuro:  Speech clear   Medical Decision Making  Medically screening exam initiated at 1:18 PM.  Appropriate orders placed.  Nicholas Glenn was informed that the remainder of the evaluation will be completed by another provider, this initial triage assessment does not replace that evaluation, and the importance of remaining in the ED until their evaluation is complete.  Clinical Impression  1.  Bleeding around Nicholas Glenn, [place] 03/09/21 1322

## 2021-03-09 NOTE — ED Provider Notes (Signed)
[facility] EMERGENCY DEPARTMENT Provider Note   CSN: [id number] Arrival date & time: 03/09/21  1300     History No chief complaint on file.   Nicholas Glenn is a 65 y.o. male.  HPI He pesents for evaluation of bleeding from right chest wall catheter site.  Onset of bleeding this morning.  He came here because of the bleeding, with concern for problems with the catheter.  He had this dialysis catheter placed, 6 days ago.  He was dialyzed, 2 days ago.  He plans to go for dialysis tomorrow.  He had a VATS procedure, left chest wall for mass, on 02/28/2021.  During recovery he had some hemoptysis so he stopped taking his Eliquis for 8 days, restarted yesterday and took 2 doses.  He did not take any Eliquis today.  He has been eating well.  He denies fever, chills, weakness or dizziness.  Past Medical History:  Diagnosis Date  . CHF (congestive heart failure) ([place]) 10/18/2018   Hospitalized at the [facility] [place]  . Diabetes mellitus without complication ([place])   . Hypertension   . Melanoma ([place]) 1997, [id number], 03/2015  . Polycystic kidney disease 06/11/2012    There are no problems to display for this patient.   Past Surgical History:  Procedure Laterality Date  . nephrectomy  05/29/2005   bilateral native  . s/p kidney transplantation  08/14/2005   S/P [facility] donor kidney transplantation       No family history on file.  Social History   Tobacco Use  . Smoking status: Never Smoker  . Smokeless tobacco: Never Used  Vaping Use  . Vaping Use: Never used  Substance Use Topics  . Drug use: Never    Home Medications Prior to Admission medications   Medication Sig Start Date End Date Taking? Authorizing Provider  acetaminophen (TYLENOL) 500 MG tablet Take 1,000 mg by mouth every 6 (six) hours as needed for mild pain.   Yes [provider]  amLODipine (NORVASC) 5 MG tablet Take 2.5 mg by mouth at bedtime.   Yes [provider]   apixaban (ELIQUIS) 5 MG TABS tablet Take 5 mg by mouth 2 (two) times daily.   Yes [provider]  calcium acetate (PHOSLO) 667 MG capsule Take 667 mg by mouth 3 (three) times daily with meals.   Yes [provider]  carvedilol (COREG) 25 MG tablet Take 25 mg by mouth in the morning and at bedtime.   Yes [provider]  doxercalciferol (HECTOROL) 0.5 MCG capsule Take 1 mcg by mouth daily.   Yes [provider]  insulin aspart (NOVOLOG) 100 UNIT/ML injection Inject 10 Units into the skin See admin instructions. Per sliding scale as needed for blood sugar   Yes [provider]  insulin aspart protamine- aspart (NOVOLOG MIX 70/30) (70-30) 100 UNIT/ML injection Inject 8 Units into the skin in the morning and at bedtime.   Yes [provider]  predniSONE (DELTASONE) 5 MG tablet Take 5 mg by mouth daily with breakfast.   Yes [provider]  rosuvastatin (CRESTOR) 40 MG tablet Take 20 mg by mouth at bedtime.   Yes [provider]  tacrolimus (PROGRAF) 0.5 MG capsule Take 1 mg by mouth 2 (two) times daily.   Yes [provider]  torsemide (DEMADEX) 20 MG tablet Take 20 mg by mouth daily.   Yes [provider]    Allergies    Bee venom and Vancomycin  Review of  Systems   Review of Systems  All other systems reviewed and are negative.   Physical Exam Updated Vital Signs BP (!) 148/88 (BP Location: Right Arm)   Pulse 100   Temp 98.6 F (37 C) (Oral)   Resp 16   SpO2 98%   Physical Exam Vitals and nursing note reviewed.  Constitutional:      General: He is not in acute distress.    Appearance: He is well-developed. He is not ill-appearing, toxic-appearing or diaphoretic.  HENT:     Head: Normocephalic and atraumatic.     Right Ear: External ear normal.     Left Ear: External ear normal.  Eyes:     Conjunctiva/sclera: Conjunctivae normal.     Pupils: Pupils are equal, round, and reactive to light.   Neck:     Trachea: Phonation normal.  Cardiovascular:     Rate and Rhythm: Normal rate.     Comments: Vascular catheter, site, appliance, and dressing appear normal.  On arrival he had a blood soaked 4 x 4 placed beneath the dressing site that was saturated with blood and a small clot adherent to it.  There is no significant associated swelling or fluctuance associated with the catheter line track or entry point near the clavicular area.  The chest wall is nontender. Pulmonary:     Effort: Pulmonary effort is normal.  Chest:     Chest wall: No tenderness.  Abdominal:     General: There is no distension.     Tenderness: There is no abdominal tenderness.  Musculoskeletal:        General: Normal range of motion.     Cervical back: Normal range of motion and neck supple.  Skin:    General: Skin is warm and dry.  Neurological:     Mental Status: He is alert and oriented to person, place, and time.     Cranial Nerves: No cranial nerve deficit.     Sensory: No sensory deficit.     Motor: No abnormal muscle tone.     Coordination: Coordination normal.  Psychiatric:        Mood and Affect: Mood normal.        Behavior: Behavior normal.        Thought Content: Thought content normal.        Judgment: Judgment normal.     ED Results / Procedures / Treatments   Labs (all labs ordered are listed, but only abnormal results are displayed) Labs Reviewed  CBC WITH DIFFERENTIAL/PLATELET - Abnormal; Notable for the following components:      Result Value   WBC 18.5 (*)    RBC 3.74 (*)    Hemoglobin 10.4 (*)    HCT 34.2 (*)    Neutro Abs 16.4 (*)    Lymphs Abs 0.4 (*)    Monocytes Absolute 1.3 (*)    Abs Immature Granulocytes 0.11 (*)    All other components within normal limits  PROTIME-INR - Abnormal; Notable for the following components:   Prothrombin Time 16.0 (*)    INR 1.3 (*)    All other components within normal limits  APTT  BASIC METABOLIC PANEL     EKG None  Radiology DG Chest Port 1 View  Result Date: 03/09/2021 CLINICAL DATA:  Catheter placement. EXAM: PORTABLE CHEST 1 VIEW COMPARISON:  July 07, 2005. FINDINGS: Stable cardiomegaly. Right internal jugular dialysis catheter is noted with distal tip in expected position of cavoatrial junction. No pneumothorax or pleural effusion is  noted. Right lung is clear. Left midlung density is noted with probable postsurgical change, which may represent scarring or inflammation or atelectasis, but possible mass cannot be excluded. Mild subcutaneous emphysema is seen over the left lateral chest wall. Bony thorax is unremarkable. Left apical postoperative change in scarring is noted. IMPRESSION: Right internal jugular dialysis catheter in grossly good position. Subcutaneous emphysema seen overlying left chest wall. Postsurgical changes are noted in the left lung apex and in the left midlung. Irregular soft tissue density is noted in the left midlung which may represent pneumonia, atelectasis or possibly scarring, but neoplasm cannot be excluded. CT scan is recommended for further evaluation. Electronically Signed   By: Marijo Conception M.D.   On: 03/09/2021 14:35    Procedures Procedures   Medications Ordered in ED Medications - No data to display  ED Course  I have reviewed the triage vital signs and the nursing notes.  Pertinent labs & imaging results that were available during my care of the patient were reviewed by me and considered in my medical decision making (see chart for details).    MDM Rules/Calculators/A&P                           Patient Vitals for the past 24 hrs:  BP Temp Temp src Pulse Resp SpO2  03/09/21 1445 (!) 148/88 -- -- 100 16 98 %  03/09/21 1430 (!) 146/95 -- -- (!) 101 -- 99 %  03/09/21 1319 139/89 98.6 F (37 C) Oral 96 16 100 %    At time of discharge- reevaluation with update and discussion. After initial assessment and treatment, an updated evaluation  reveals he remains stable without active bleeding from the site of his dialysis catheter. Daleen Bo   Medical Decision Making:  This patient is presenting for evaluation of bleeding, which does require a range of treatment options, and is a complaint that involves a moderate risk of morbidity and mortality. The differential diagnoses include surgical complication, coagulation disorder. I decided to review old records, and in summary Eh[place] male presenting for bleeding from his dialysis catheter site 6 days after it was placed.  He does not have symptoms of hypovolemia.  I obtained additional historical information from wife at bedside.  Clinical Laboratory Tests Ordered, included CBC and Metabolic panel. Review indicates normal except white count high, hemoglobin low. Radiologic Tests Ordered, included chest x-ray.  I independently Visualized: Radiographic images, which show nonspecific abnormality, likely related to recent VATS surgery    Critical Interventions-clinical evaluation, laboratory testing, radiography, observation reassess  After These Interventions, the Patient was reevaluated and was found stable for discharge.  Bleeding spontaneously controlled, likely represents residual blood in the tunnel for his dialysis catheter, extruding.  Doubt active bleeding from the catheter puncture site.  Nonspecific findings on chest x-ray likely indicative of prior recent surgical procedure, he is not presenting with signs or symptoms of pneumonia.  Nonspecific elevation of white count, which has been trending high over the last several days since 03/05/2021.  Values available in Care Everywhere.  CRITICAL CARE-no Performed by: Daleen Bo  Nursing Notes Reviewed/ Care Coordinated Applicable Imaging Reviewed Interpretation of Laboratory Data incorporated into ED treatment  The patient appears reasonably screened and/or stabilized for discharge and I doubt any other medical condition or other  [facility] requiring further screening, evaluation, or treatment in the ED at this time prior to discharge.  Plan: Home Medications-continue usual; Home Treatments-rest, fluids;  return here if the recommended treatment, does not improve the symptoms; Recommended follow up-dialysis, next day, as scheduled.  Consider recheck CBC at that time.     Final Clinical Impression(s) / ED Diagnoses Final diagnoses:  Visit for wound check    Rx / DC Orders ED Discharge Orders    None       Daleen Bo, MD 03/10/21 [phone]

## 2021-07-23 ENCOUNTER — Observation Stay (HOSPITAL_COMMUNITY): Payer: Medicare Other

## 2021-07-23 ENCOUNTER — Observation Stay (HOSPITAL_COMMUNITY)
Admission: EM | Admit: 2021-07-23 | Discharge: 2021-07-23 | Disposition: A | Discharge status: Home / Self Care | Payer: Medicare Other | Attending: Internal Medicine | Admitting: Internal Medicine

## 2021-07-23 ENCOUNTER — Other Ambulatory Visit: Payer: Self-pay

## 2021-07-23 ENCOUNTER — Encounter (HOSPITAL_COMMUNITY): Payer: Self-pay

## 2021-07-23 DIAGNOSIS — I480 Paroxysmal atrial fibrillation: Secondary | ICD-10-CM | POA: Diagnosis present

## 2021-07-23 DIAGNOSIS — Z794 Long term (current) use of insulin: Secondary | ICD-10-CM | POA: Diagnosis not present

## 2021-07-23 DIAGNOSIS — K922 Gastrointestinal hemorrhage, unspecified: Principal | ICD-10-CM | POA: Diagnosis present

## 2021-07-23 DIAGNOSIS — I4892 Unspecified atrial flutter: Secondary | ICD-10-CM | POA: Insufficient documentation

## 2021-07-23 DIAGNOSIS — Z9989 Dependence on other enabling machines and devices: Secondary | ICD-10-CM

## 2021-07-23 DIAGNOSIS — Z20822 Contact with and (suspected) exposure to covid-19: Secondary | ICD-10-CM | POA: Insufficient documentation

## 2021-07-23 DIAGNOSIS — E1122 Type 2 diabetes mellitus with diabetic chronic kidney disease: Secondary | ICD-10-CM | POA: Insufficient documentation

## 2021-07-23 DIAGNOSIS — I509 Heart failure, unspecified: Secondary | ICD-10-CM | POA: Insufficient documentation

## 2021-07-23 DIAGNOSIS — D696 Thrombocytopenia, unspecified: Secondary | ICD-10-CM | POA: Diagnosis not present

## 2021-07-23 DIAGNOSIS — I5020 Unspecified systolic (congestive) heart failure: Secondary | ICD-10-CM

## 2021-07-23 DIAGNOSIS — N186 End stage renal disease: Secondary | ICD-10-CM | POA: Diagnosis not present

## 2021-07-23 DIAGNOSIS — D631 Anemia in chronic kidney disease: Secondary | ICD-10-CM | POA: Insufficient documentation

## 2021-07-23 DIAGNOSIS — Z7901 Long term (current) use of anticoagulants: Secondary | ICD-10-CM

## 2021-07-23 DIAGNOSIS — I959 Hypotension, unspecified: Secondary | ICD-10-CM | POA: Diagnosis present

## 2021-07-23 DIAGNOSIS — Z79899 Other long term (current) drug therapy: Secondary | ICD-10-CM | POA: Diagnosis not present

## 2021-07-23 DIAGNOSIS — K838 Other specified diseases of biliary tract: Secondary | ICD-10-CM

## 2021-07-23 DIAGNOSIS — I132 Hypertensive heart and chronic kidney disease with heart failure and with stage 5 chronic kidney disease, or end stage renal disease: Secondary | ICD-10-CM | POA: Insufficient documentation

## 2021-07-23 DIAGNOSIS — E118 Type 2 diabetes mellitus with unspecified complications: Secondary | ICD-10-CM | POA: Diagnosis not present

## 2021-07-23 DIAGNOSIS — Z992 Dependence on renal dialysis: Secondary | ICD-10-CM | POA: Insufficient documentation

## 2021-07-23 DIAGNOSIS — E119 Type 2 diabetes mellitus without complications: Secondary | ICD-10-CM

## 2021-07-23 DIAGNOSIS — Z85820 Personal history of malignant melanoma of skin: Secondary | ICD-10-CM | POA: Insufficient documentation

## 2021-07-23 DIAGNOSIS — K625 Hemorrhage of anus and rectum: Secondary | ICD-10-CM | POA: Diagnosis present

## 2021-07-23 DIAGNOSIS — G4733 Obstructive sleep apnea (adult) (pediatric): Secondary | ICD-10-CM

## 2021-07-23 DIAGNOSIS — D649 Anemia, unspecified: Secondary | ICD-10-CM

## 2021-07-23 HISTORY — DX: Gastrointestinal hemorrhage, unspecified: K92.2

## 2021-07-23 LAB — CBC WITH DIFFERENTIAL/PLATELET
Abs Immature Granulocytes: 0.04 10*3/uL (ref 0.00–0.07)
Abs Immature Granulocytes: 0.05 10*3/uL (ref 0.00–0.07)
Basophils Absolute: 0 10*3/uL (ref 0.0–0.1)
Basophils Absolute: 0 10*3/uL (ref 0.0–0.1)
Basophils Relative: 0 %
Basophils Relative: 0 %
Eosinophils Absolute: 0.1 10*3/uL (ref 0.0–0.5)
Eosinophils Absolute: 0.1 10*3/uL (ref 0.0–0.5)
Eosinophils Relative: 1 %
Eosinophils Relative: 1 %
HCT: 16.5 % — ABNORMAL LOW (ref 39.0–52.0)
HCT: 13.4 % — ABNORMAL LOW (ref 39.0–52.0)
Hemoglobin: 5.5 g/dL — CL (ref 13.0–17.0)
Hemoglobin: 4.4 g/dL — CL (ref 13.0–17.0)
Immature Granulocytes: 1 %
Immature Granulocytes: 1 %
Lymphocytes Relative: 14 %
Lymphocytes Relative: 10 %
Lymphs Abs: 1.2 10*3/uL (ref 0.7–4.0)
Lymphs Abs: 1.1 10*3/uL (ref 0.7–4.0)
MCH: 30.1 pg (ref 26.0–34.0)
MCH: 29.3 pg (ref 26.0–34.0)
MCHC: 33.3 g/dL (ref 30.0–36.0)
MCHC: 32.8 g/dL (ref 30.0–36.0)
MCV: 90.2 fL (ref 80.0–100.0)
MCV: 89.3 fL (ref 80.0–100.0)
Monocytes Absolute: 0.5 10*3/uL (ref 0.1–1.0)
Monocytes Absolute: 0.8 10*3/uL (ref 0.1–1.0)
Monocytes Relative: 5 %
Monocytes Relative: 8 %
Neutro Abs: 6.9 10*3/uL (ref 1.7–7.7)
Neutro Abs: 8.9 10*3/uL — ABNORMAL HIGH (ref 1.7–7.7)
Neutrophils Relative %: 79 %
Neutrophils Relative %: 80 %
Platelets: 135 10*3/uL — ABNORMAL LOW (ref 150–400)
RBC: 1.83 MIL/uL — ABNORMAL LOW (ref 4.22–5.81)
RBC: 1.5 MIL/uL — ABNORMAL LOW (ref 4.22–5.81)
RDW: 18.3 % — ABNORMAL HIGH (ref 11.5–15.5)
RDW: 18.5 % — ABNORMAL HIGH (ref 11.5–15.5)
WBC: 8.6 10*3/uL (ref 4.0–10.5)
WBC: 10.9 10*3/uL — ABNORMAL HIGH (ref 4.0–10.5)
nRBC: 0 % (ref 0.0–0.2)
nRBC: 0 % (ref 0.0–0.2)

## 2021-07-23 LAB — GLUCOSE, CAPILLARY: Glucose-Capillary: 164 mg/dL — ABNORMAL HIGH (ref 70–99)

## 2021-07-23 LAB — COMPREHENSIVE METABOLIC PANEL
ALT: 33 U/L (ref 0–44)
AST: 12 U/L — ABNORMAL LOW (ref 15–41)
Albumin: 2.9 g/dL — ABNORMAL LOW (ref 3.5–5.0)
Alkaline Phosphatase: 97 U/L (ref 38–126)
Anion gap: 13 (ref 5–15)
BUN: 56 mg/dL — ABNORMAL HIGH (ref 8–23)
CO2: 30 mmol/L (ref 22–32)
Calcium: 8.5 mg/dL — ABNORMAL LOW (ref 8.9–10.3)
Chloride: 91 mmol/L — ABNORMAL LOW (ref 98–111)
Creatinine, Ser: 5.54 mg/dL — ABNORMAL HIGH (ref 0.61–1.24)
GFR, Estimated: 11 mL/min — ABNORMAL LOW (ref >60)
Glucose, Bld: 199 mg/dL — ABNORMAL HIGH (ref 70–99)
Potassium: 3.9 mmol/L (ref 3.5–5.1)
Sodium: 134 mmol/L — ABNORMAL LOW (ref 135–145)
Total Bilirubin: 0.6 mg/dL (ref 0.3–1.2)
Total Protein: 5.4 g/dL — ABNORMAL LOW (ref 6.5–8.1)

## 2021-07-23 LAB — PREPARE RBC (CROSSMATCH)

## 2021-07-23 LAB — PROTIME-INR
INR: 1.3 — ABNORMAL HIGH (ref 0.8–1.2)
Prothrombin Time: 16.1 seconds — ABNORMAL HIGH (ref 11.4–15.2)

## 2021-07-23 LAB — POC OCCULT BLOOD, ED: Fecal Occult Bld: POSITIVE — AB

## 2021-07-23 LAB — RESP PANEL BY RT-PCR (FLU A&B, COVID) ARPGX2
Influenza A by PCR: NEGATIVE
Influenza B by PCR: NEGATIVE
SARS Coronavirus 2 by RT PCR: NEGATIVE

## 2021-07-23 LAB — ABO/RH: ABO/RH(D): O NEG

## 2021-07-23 MED ORDER — PANTOPRAZOLE 80MG IVPB - SIMPLE MED
80.0000 mg | Freq: Once | INTRAVENOUS | Status: AC
  Administered 2021-07-23: 80 mg via INTRAVENOUS
  Filled 2021-07-23: qty 80

## 2021-07-23 MED ORDER — SODIUM CHLORIDE 0.9% IV SOLUTION: Freq: Once | INTRAVENOUS | Status: AC

## 2021-07-23 MED ORDER — PANTOPRAZOLE SODIUM 40 MG IV SOLR: 40.0000 mg | Freq: Two times a day (BID) | INTRAVENOUS | Status: DC

## 2021-07-23 MED ORDER — ONDANSETRON HCL 4 MG/2ML IJ SOLN: 4.0000 mg | Freq: Four times a day (QID) | INTRAMUSCULAR | Status: DC | PRN

## 2021-07-23 MED ORDER — IOHEXOL 350 MG/ML SOLN
100.0000 mL | Freq: Once | INTRAVENOUS | Status: AC | PRN
  Administered 2021-07-23: 100 mL via INTRAVENOUS

## 2021-07-23 MED ORDER — ONDANSETRON HCL 4 MG PO TABS: 4.0000 mg | ORAL_TABLET | Freq: Four times a day (QID) | ORAL | Status: DC | PRN

## 2021-07-23 MED ORDER — CALCIUM ACETATE (PHOS BINDER) 667 MG PO CAPS: 667.0000 mg | ORAL_CAPSULE | Freq: Three times a day (TID) | ORAL | Status: DC

## 2021-07-23 MED ORDER — PROTHROMBIN COMPLEX CONC HUMAN 500 UNITS IV KIT
3848.0000 [IU] | PACK | Status: AC
  Administered 2021-07-23: 3848 [IU] via INTRAVENOUS
  Filled 2021-07-23: qty 3348

## 2021-07-23 MED ORDER — SODIUM CHLORIDE 0.9% FLUSH: 3.0000 mL | Freq: Two times a day (BID) | INTRAVENOUS | Status: DC

## 2021-07-23 MED ORDER — ALBUTEROL SULFATE (2.5 MG/3ML) 0.083% IN NEBU: 2.5000 mg | INHALATION_SOLUTION | Freq: Four times a day (QID) | RESPIRATORY_TRACT | Status: DC | PRN

## 2021-07-23 MED ORDER — ACETAMINOPHEN 325 MG PO TABS: 650.0000 mg | ORAL_TABLET | Freq: Four times a day (QID) | ORAL | Status: DC | PRN

## 2021-07-23 MED ORDER — PANTOPRAZOLE INFUSION (NEW) - SIMPLE MED: 8.0000 mg/h | INTRAVENOUS | Status: DC

## 2021-07-23 MED ORDER — INSULIN ASPART 100 UNIT/ML IJ SOLN: 0.0000 [IU] | INTRAMUSCULAR | Status: DC

## 2021-07-23 MED ORDER — ACETAMINOPHEN 650 MG RE SUPP: 650.0000 mg | Freq: Four times a day (QID) | RECTAL | Status: DC | PRN

## 2021-07-23 MED ORDER — TAMSULOSIN HCL 0.4 MG PO CAPS: 0.4000 mg | ORAL_CAPSULE | Freq: Every day | ORAL | Status: DC

## 2021-07-23 MED ORDER — MUPIROCIN 2 % EX OINT: 1.0000 "application " | TOPICAL_OINTMENT | Freq: Every day | CUTANEOUS | Status: DC

## 2021-07-23 MED ORDER — INSULIN ASPART 100 UNIT/ML IJ SOLN: 0.0000 [IU] | Freq: Four times a day (QID) | INTRAMUSCULAR | Status: DC

## 2021-07-23 MED ORDER — PREDNISONE 5 MG PO TABS: 5.0000 mg | ORAL_TABLET | Freq: Every day | ORAL | Status: DC

## 2021-07-23 MED ORDER — LIDOCAINE-PRILOCAINE 2.5-2.5 % EX CREA: 1.0000 "application " | TOPICAL_CREAM | CUTANEOUS | Status: DC | PRN

## 2021-07-23 MED ORDER — PROTHROMBIN COMPLEX CONC HUMAN 500 UNITS IV KIT
3562.0000 [IU] | PACK | Status: DC
  Filled 2021-07-23: qty 3562

## 2021-07-23 MED ORDER — SODIUM CHLORIDE 0.9 % IV BOLUS
500.0000 mL | Freq: Once | INTRAVENOUS | Status: AC
  Administered 2021-07-23: 500 mL via INTRAVENOUS

## 2021-07-23 MED ORDER — SODIUM CHLORIDE 0.9 % IV SOLN
10.0000 mL/h | Freq: Once | INTRAVENOUS | Status: AC
  Administered 2021-07-23: 10 mL/h via INTRAVENOUS

## 2021-07-23 MED ORDER — CHLORHEXIDINE GLUCONATE CLOTH 2 % EX PADS: 6.0000 | MEDICATED_PAD | Freq: Every day | CUTANEOUS | Status: DC

## 2021-07-23 MED ORDER — PANTOPRAZOLE INFUSION (NEW) - SIMPLE MED
8.0000 mg/h | INTRAVENOUS | Status: DC
  Administered 2021-07-23: 8 mg/h via INTRAVENOUS
  Filled 2021-07-23: qty 80

## 2021-07-23 MED ORDER — DOXERCALCIFEROL 0.5 MCG PO CAPS
0.5000 ug | ORAL_CAPSULE | Freq: Every day | ORAL | Status: DC
  Filled 2021-07-23: qty 1

## 2021-07-23 MED ORDER — TACROLIMUS 1 MG PO CAPS: 1.0000 mg | ORAL_CAPSULE | Freq: Two times a day (BID) | ORAL | Status: DC

## 2021-07-23 MED ORDER — ROSUVASTATIN CALCIUM 20 MG PO TABS: 20.0000 mg | ORAL_TABLET | Freq: Every day | ORAL | Status: DC

## 2021-07-23 NOTE — H&P (Addendum)
History and Physical    Nicholas Glenn VZD:[id number] DOB: 28-Jul-1956 DOA: 07/23/2021  Referring MD/NP/PA: Thamas Jaegers, MD PCP: Nicholas Sims, MD  Patient coming from: Home via EMS  Chief Complaint: Blood in stool  I have personally briefly reviewed patient's old medical records in [place]   HPI: Nicholas Glenn is a 65 y.o. 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   Patient had just recently been hospitalized at [facility] 3 weeks ago where he underwent a liver biopsy while in the process of being evaluated for possible transplant which led to bleeding into his gallbladder for which GI had to perform an ERCP to remove clots from his bile duct.  Anticoagulation of Eliquis was held for 72 hours prior to being restarted at his discharge.  Since getting discharged from hospital patient reported that his stools were initially dark, but had seemed to return to its normal color last week.  This week he had an iron infusion with hemodialysis on Monday.  Normally dialyzes at home on Monday, Tuesday, Thursday, and Fridays.  4 days ago he he missed dialysis due to feeling sick and nauseous with reports of low blood pressures.  2 days ago he also missed his hemodialysis session after checking his orthostatic vital signs and sitting down to start hemodialysis because he passed out.  Wife estimates he lost consciousness for approximately 10 seconds.  EMS was called and patient blood pressures improved after getting him in the Trendelenburg position.  He followed up with his cardiologist later that day where blood work was done including blood cultures and he was recommended to hold Coreg and Restoril.  He had  continued to take Eliquis and his last dose was last night.  His wife notes that they did eventually find out that his hemoglobin was down to 7 g/dL today.  Patient did report that stools had become dark again, but he attributes that to the iron infusion that he received earlier in the week.  He was able to have a full hemodialysis session yesterday.  This morning he had gotten up around 1 AM and use the bathroom and had a loose stool, but did not look at the stool at that time.  He woke again at around 4 AM and pulled off his CPAP Pap mask and complained of feeling dizzy before passing out.  After coming back to patient trying to get up and take a step but passed out again for which his wife caught him and laid him down to the floor.  Patient reported having urgency to have a bowel movement, but was unable to get up and had a maroon-colored stool.  He did admit to intermittently having some upper right quadrant discomfort similar to what he experienced when he had the bleed earlier this month, but less severe.  Currently denies any complaints of fever, shortness of breath, leg swelling, chest pain, nausea, or vomiting.  ED Course: Upon admission into the emergency department patient was noted to be afebrile, blood pressure 91/48-115/67, and O2 saturations maintained on room air.  Labs significant for hemoglobin 5.5, platelets 35, sodium 134, potassium 3.9, BUN 56, and BUN 5.4.  Stool guaiac was noted to be positive.  Gastroenterology was consultedPatient was  typed and screened and ordered 2 units of packed red blood cells to be transfused and Kcentra.  Given recent hospitalization at [facility] transfer to [facility] was requested and patient was accepted by Nicholas Coma, MD hospitalist, but no beds were currently available.  PCCM was asked to evaluate patient, but recommended stepdown bed.  GI recommended obtaining a CT angiogram to see if a possible source of bleeding could be found and to start him on a Protonix  drip.  Review of Systems  Constitutional:  Negative for fever and weight loss.  HENT:  Negative for congestion and nosebleeds.   Eyes:  Negative for photophobia and pain.  Respiratory:  Negative for cough and shortness of breath.   Cardiovascular:  Negative for chest pain and leg swelling.  Gastrointestinal:  Positive for abdominal pain and blood in stool. Negative for nausea and vomiting.  Genitourinary:  Negative for dysuria and hematuria.  Musculoskeletal:  Negative for myalgias.  Neurological:  Positive for dizziness, loss of consciousness and weakness.  Psychiatric/Behavioral:  Negative for substance abuse.    Past Medical History:  Diagnosis Date   CHF (congestive heart failure) (Normal) 10/18/2018   Hospitalized at the [facility]   Diabetes mellitus without complication [facility])    Hypertension    Melanoma ([place]) 1997, 2013,2014, 03/2015   Polycystic kidney disease 06/11/2012    Past Surgical History:  Procedure Laterality Date   nephrectomy  05/29/2005   bilateral native   s/p kidney transplantation  08/14/2005   S/P livning donor kidney transplantation     reports that he has never smoked. He has never used smokeless tobacco. He reports that he does not drink alcohol and does not use drugs.  Allergies  Allergen Reactions   Bee Venom Swelling   Vancomycin Other (See Comments)    Flushing - resolves when infusion is slow    History reviewed. No pertinent family history.  Prior to Admission medications   Medication Sig Start Date End Date Taking? Authorizing Provider  acetaminophen (TYLENOL) 500 MG tablet Take 1,000 mg by mouth every 6 (six) hours as needed for mild pain.   Yes [provider]  apixaban (ELIQUIS) 5 MG TABS tablet Take 5 mg by mouth 2 (two) times daily.   Yes [provider]  calcium acetate (PHOSLO) 667 MG capsule Take 667 mg by mouth 3 (three) times daily with meals.   Yes [provider]  doxercalciferol  (HECTOROL) 0.5 MCG capsule Take 0.5 mcg by mouth daily.   Yes [provider]  insulin aspart (NOVOLOG) 100 UNIT/ML injection Inject 0-4 Units into the skin See admin instructions. Per sliding scale as needed for blood sugar   Yes [provider]  insulin aspart protamine- aspart (NOVOLOG MIX 70/30) (70-30) 100 UNIT/ML injection Inject 8-12 Units into the skin in the morning and at bedtime. 12 units in the morning 8 units with dinner   Yes [provider]  lidocaine-prilocaine (EMLA) cream Apply 1 application topically See admin instructions. Monday,tuesday,thursday,friday with dialysis 06/20/21  Yes [provider]  mupirocin ointment (BACTROBAN) 2 % Apply 1 application topically See admin instructions. After dialysis 06/20/21  Yes [provider]  predniSONE (DELTASONE) 5 MG tablet Take 5 mg by mouth daily with breakfast.   Yes [provider]  rosuvastatin (CRESTOR) 40 MG tablet Take 20 mg by mouth at bedtime.   Yes [provider]  tacrolimus (PROGRAF) 1 MG capsule Take 1 mg by mouth 2 (two) times daily.  Yes [provider]  tamsulosin (FLOMAX) 0.4 MG CAPS capsule Take 0.4 mg by mouth daily. 05/30/21  Yes [provider]  torsemide (DEMADEX) 20 MG tablet Take 20 mg by mouth daily.   Yes [provider]  carvedilol (COREG) 25 MG tablet Take 25 mg by mouth in the morning and at bedtime.    [provider]  sacubitril-valsartan (ENTRESTO) 49-51 MG 0.5 tablets in the morning and at bedtime. 04/01/21 04/02/22  [provider]    Physical Exam:  Constitutional: Elderly male currently in no acute distress Vitals:   07/23/21 0832 07/23/21 0840 07/23/21 0848 07/23/21 1017  BP: (!) 91/48  (!) 94/47 (!) 105/50  Pulse: 97  97 90  Resp: 13  16 16   Temp: 98.4 F (36.9 C)  98.5 F (36.9 C) 98.5 F (36.9 C)  TempSrc: Temporal  Temporal Temporal  SpO2: 98%  99% 99%  Weight:  74.5 kg    Height:  5'  9" (1.753 m)     Eyes: PERRL, lids and conjunctivae normal ENMT: Mucous membranes are moist. Posterior pharynx clear of any exudate or lesions.   Neck: normal, supple, no masses, no thyromegaly.  No JVD Respiratory: clear to auscultation bilaterally, no wheezing, no crackles. Normal respiratory effort. No accessory muscle use.  Cardiovascular: Regular rate and rhythm.  Right upper chest wall hemodialysis catheter.  Left upper extremity fistula in place. Abdomen: no tenderness, no masses palpated. No hepatosplenomegaly. Bowel sounds positive.  Musculoskeletal: no clubbing / cyanosis. No joint deformity upper and lower extremities. Good ROM, no contractures. Normal muscle tone.  Skin: Healed skin from previous biopsy.  Lesion noted of the right lower quadrant of the abdomen near the umbilicus Neurologic: CN 2-12 grossly intact. Sensation intact, DTR normal. Strength 5/5 in all 4.  Psychiatric: Normal judgment and insight. Alert and oriented x 3. Normal mood.     Labs on Admission: I have personally reviewed following labs and imaging studies  CBC: Recent Labs  Lab 07/23/21 0458 07/23/21 0745  WBC 8.6 10.9*  NEUTROABS 6.9 8.9*  HGB 5.5* 4.4*  HCT 16.5* 13.4*  MCV 90.2 89.3  PLT SPECIMEN CLOTTED 638*   Basic Metabolic Panel: Recent Labs  Lab 07/23/21 0458  NA 134*  K 3.9  CL 91*  CO2 30  GLUCOSE 199*  BUN 56*  CREATININE 5.54*  CALCIUM 8.5*   GFR: Estimated Creatinine Clearance: 13.8 mL/min (A) (by C-G formula based on SCr of 5.54 mg/dL (H)). Liver Function Tests: Recent Labs  Lab 07/23/21 0458  AST 12*  ALT 33  ALKPHOS 97  BILITOT 0.6  PROT 5.4*  ALBUMIN 2.9*   No results for input(s): LIPASE, AMYLASE in the last 168 hours. No results for input(s): AMMONIA in the last 168 hours. Coagulation Profile: Recent Labs  Lab 07/23/21 0458  INR 1.3*   Cardiac Enzymes: No results for input(s): CKTOTAL, CKMB, CKMBINDEX, TROPONINI in the last 168 hours. BNP (last 3  results) No results for input(s): PROBNP in the last 8760 hours. HbA1C: No results for input(s): HGBA1C in the last 72 hours. CBG: No results for input(s): GLUCAP in the last 168 hours. Lipid Profile: No results for input(s): CHOL, HDL, LDLCALC, TRIG, CHOLHDL, LDLDIRECT in the last 72 hours. Thyroid Function Tests: No results for input(s): TSH, T4TOTAL, FREET4, T3FREE, THYROIDAB in the last 72 hours. Anemia Panel: No results for input(s): VITAMINB12, FOLATE, FERRITIN, TIBC, IRON, RETICCTPCT in the last 72 hours. Urine analysis: No results found for: COLORURINE, APPEARANCEUR,  LABSPEC, PHURINE, GLUCOSEU, HGBUR, BILIRUBINUR, KETONESUR, PROTEINUR, UROBILINOGEN, NITRITE, LEUKOCYTESUR Sepsis Labs: Recent Results (from the past 240 hour(s))  Resp Panel by RT-PCR (Flu A&B, Covid) Nasopharyngeal Swab     Status: None   Collection Time: 07/23/21  6:36 AM   Specimen: Nasopharyngeal Swab; Nasopharyngeal(NP) swabs in vial transport medium  Result Value Ref Range Status   SARS Coronavirus 2 by RT PCR NEGATIVE NEGATIVE Final    Comment: (NOTE) SARS-CoV-2 target nucleic acids are NOT DETECTED.  The SARS-CoV-2 RNA is generally detectable in upper respiratory specimens during the acute phase of infection. The lowest concentration of SARS-CoV-2 viral copies this assay can detect is 138 copies/mL. A negative result does not preclude SARS-Cov-2 infection and should not be used as the sole basis for treatment or other patient management decisions. A negative result may occur with  improper specimen collection/handling, submission of specimen other than nasopharyngeal swab, presence of viral mutation(s) within the areas targeted by this assay, and inadequate number of viral copies(<138 copies/mL). A negative result must be combined with clinical observations, patient history, and epidemiological information. The expected result is Negative.  Fact Sheet for Patients:   EntrepreneurPulse.com.au  Fact Sheet for Healthcare Providers:  IncredibleEmployment.be  This test is no t yet approved or cleared by the [place] FDA and  has been authorized for detection and/or diagnosis of SARS-CoV-2 by FDA under an Emergency Use Authorization (EUA). This EUA will remain  in effect (meaning this test can be used) for the duration of the COVID-19 declaration under Section 564(b)(1) of the Act, 21 U.S.C.section 360bbb-3(b)(1), unless the authorization is terminated  or revoked sooner.       Influenza A by PCR NEGATIVE NEGATIVE Final   Influenza B by PCR NEGATIVE NEGATIVE Final    Comment: (NOTE) The Xpert Xpress SARS-CoV-2/FLU/RSV plus assay is intended as an aid in the diagnosis of influenza from Nasopharyngeal swab specimens and should not be used as a sole basis for treatment. Nasal washings and aspirates are unacceptable for Xpert Xpress SARS-CoV-2/FLU/RSV testing.  Fact Sheet for Patients: EntrepreneurPulse.com.au  Fact Sheet for Healthcare Providers: IncredibleEmployment.be  This test is not yet approved or cleared by the [place] FDA and has been authorized for detection and/or diagnosis of SARS-CoV-2 by FDA under an Emergency Use Authorization (EUA). This EUA will remain in effect (meaning this test can be used) for the duration of the COVID-19 declaration under Section 564(b)(1) of the Act, 21 U.S.C. section 360bbb-3(b)(1), unless the authorization is terminated or revoked.  Performed at [facility], [place] [address], [place], [place] [address]      Radiological Exams on Admission: No results found.  EKG: Independently reviewed.  Sinus rhythm at 92 bpm  Assessment/Plan  Acute blood loss anemia secondary to GI bleed history of recent GI bleeding: Patient presents after having 2 loose stools this morning.  The second which was noted to be maroon in color.   Hemoglobin reported to have dropped from 7 g/dL on Thursday down to as low as 4.4 g/dL here today.  Stool guaiacs were noted to be positive.  He was typed and screened and given Kcentra to reverse Eliquis that he last taken yesterday evening.  GI had been formally consulted and started the patient on a Protonix drip.  Previous bleeding was secondary to liver biopsy resulting in hemobilia requiring ERCP to remove clots from bile ducts. -Admit to a progressive bed -N.p.o. -Changed transfusion to a total of 3 units of packed red blood  cells and 1 unit of FFP -Recheck H&H posttransfusion -Hold anticoagulation -Continue Protonix drip -Check CT angiogram of the abdomen -Will formally consult IR if source of active bleeding appreciated  Hypotension: Acute.  Initially blood pressures as low as 91/48.  Blood pressure seem to improve after transfusion of blood was started.  Patient had already been holding Coreg and Entresto.  Hypotension thought more likely secondary to acute blood loss and a concern for adrenal insufficiency. -Continue holding home blood pressure medications -Goal MAP greater than 65  ESRD on HD history of  kidney transplant: Patient with polycystic kidney disease status post living donor kidney transplant complicated by graft graft failure on chronic immunosuppressive therapy.  Normally dialyzes Monday, Tuesday, Thursday, and Friday.  He missed hemodialysis on Tuesday and Thursday of this week and last dialyzed yesterday.  Labs from yesterday revealed sodium 134, potassium 3.9, BUN 36, creatinine 5.54.  Patient has a matured left upper extremity fistula, and there are plans to remove his right upper chest wall hemodialysis catheter on 9/8. -Continue prednisone and and Prograf -Check renal function panel daily -Nephrology consulted for likely need of HD  Paroxysmal atrial flutter on anticoagulation: Patient had last taken Eliquis yesterday evening and currently appears to be in sinus  rhythm.  He had completed had Kcentra reversal.   -Continue to monitor  Diabetes mellitus type 2: On admission glucose mildly elevated up to 199.  Last hemoglobin A1c 6.3 on 01/07/2021. -Hypoglycemic protocols -Hold home insulin regimen -CBGs every 6 hours with very sensitive SSI while n.p.o.  Heart failure with reduced EF: Chronic.  Patient appears to be hypovolemic at this time.  Last echocardiogram revealed EF of 45% with with elevated LA pressures and diastolic dysfunction and small pericardial effusion from 9/1. -Strict intake and output -Daily weights -Fluid management with HD  Hyperlipidemia -Continue Crestor  Thrombocytopenia: Acute.  Platelet count noted to be 135  on repeat check.  Continue to monitor platelet counts.  OSA on CPAP -CPAP nightly per RT.  Patient's wife to bring home machine  DVT prophylaxis: SCDs Code Status: Full Family Communication: Wife updated at bedside Disposition Plan: Possibly transfer to [facility] when bed available Consults called: GI, nephrology, PCCM Admission status: Observation, possibly requiring less than 24-hour hospital stay  Norval Morton MD [facility]   If 7PM-7AM, please contact night-coverage   07/23/2021, 10:20 AM

## 2021-07-23 NOTE — Consult Note (Signed)
[facility] Renal Consultation Note    Indication for Consultation:  Management of ESRD/hemodialysis, anemia, hypertension/volume, and secondary hyperparathyroidism. PCP:  HPI: Nicholas Glenn is a 65 y.o. 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  Presented to ED on 9/3 after syncopal episode and 2 episodes maroon-colored stool. S/p recent admit at [facility] 8/8-8/12/22 after transjugular liver Bx 06/22/21 for new onset ascites, found to have biliary bleed requiring ERCP on 8/11. Discharge Hgb 11.1 on 8/11. Eliquis held for 3 days after ERCP - restarted on 8/14. Hgb down to 10.5 on 8/12, then 7 on 07/21/21. Today - BP 96/54 on admit. Labs with Na 134, K 3.9, BUN 56, Cr 5.54, Ca 8.5, WBC 8.6, Hgb 5.5 -> 4.4. FOBT +. COVID/Flu negative. CTA abdomen without visible active bleed in GI track, + dilated extrahepatic ducts with contained blood, and chronic polycystic liver.  Seen in ED bed - no CP, dyspnea, abdominal pain, N/V, fever, chills.  Does hemodialysis at home 4 days per week, typically [facility] but off schedule this week and ran on Mon, Wed, Fri this week. In the process of cannulating LUE AVF -- has had some issues, still has his TDC in place.  Past Medical History:  Diagnosis Date   CHF (congestive heart failure) ([place]) 10/18/2018   Hospitalized at the [facility]   Diabetes mellitus without complication [facility])    Hypertension    Melanoma ([place]) 1997, 2013,2014, 03/2015   Polycystic kidney disease 06/11/2012   Past Surgical History:  Procedure Laterality Date   nephrectomy  05/29/2005   bilateral native   s/p kidney transplantation  08/14/2005   S/P livning donor kidney transplantation   History reviewed. No pertinent family history. Social History:   reports that he has never smoked. He has never used smokeless tobacco. He reports that he does not drink alcohol and does not use drugs.  ROS: As per HPI otherwise negative.  Physical Exam: Vitals:   07/23/21 1230 07/23/21 1245 07/23/21 1300 07/23/21 1315  BP: 131/60 122/60 125/61 131/65  Pulse: 94 96 95 89  Resp: 12 12 12 12   Temp:      TempSrc:      SpO2: 100% 97% 98% 98%  Weight:      Height:         General: Well developed, well nourished, in no acute distress. Room air. Head: Normocephalic, atraumatic, sclera non-icteric, mucus membranes are moist. Neck: Supple without lymphadenopathy/masses. JVD not elevated. Lungs: Clear bilaterally to auscultation without wheezes, rales, or rhonchi. Heart: RRR with normal S1, S2. No murmurs, rubs, or gallops appreciated. Abdomen: Soft, non-tender, non-distended with normoactive bowel sounds. Well healed remote mid line scar, failed kid transplant in LLQ, non-tender Musculoskeletal:  Strength and tone appear normal for age. Lower extremities: No edema or ischemic changes, no open wounds. Neuro: Alert and oriented X 3. Moves all extremities spontaneously. Psych:  Responds to questions appropriately with a normal affect. Dialysis Access: TDC in R chest, LUE AVF + bruit/thrill  Allergies  Allergen Reactions   Bee Venom Swelling   Vancomycin Other (See Comments)    Flushing - resolves when infusion is slow   Prior to Admission medications   Medication Sig Start Date End Date Taking? Authorizing Provider  acetaminophen (TYLENOL) 500 MG tablet Take 1,000 mg by mouth every 6 (six) hours as  needed for mild pain.   Yes [provider]  apixaban (ELIQUIS) 5 MG TABS tablet Take 5 mg by mouth 2 (two) times daily.   Yes [provider]  calcium acetate (PHOSLO) 667 MG capsule Take 667 mg by mouth 3 (three) times daily with meals.   Yes [provider]  doxercalciferol (HECTOROL) 0.5 MCG capsule Take 0.5 mcg by mouth daily.    Yes [provider]  insulin aspart (NOVOLOG) 100 UNIT/ML injection Inject 0-4 Units into the skin See admin instructions. Per sliding scale as needed for blood sugar   Yes [provider]  insulin aspart protamine- aspart (NOVOLOG MIX 70/30) (70-30) 100 UNIT/ML injection Inject 8-12 Units into the skin in the morning and at bedtime. 12 units in the morning 8 units with dinner   Yes [provider]  lidocaine-prilocaine (EMLA) cream Apply 1 application topically See admin instructions. Monday,tuesday,thursday,friday with dialysis 06/20/21  Yes [provider]  mupirocin ointment (BACTROBAN) 2 % Apply 1 application topically See admin instructions. After dialysis 06/20/21  Yes [provider]  predniSONE (DELTASONE) 5 MG tablet Take 5 mg by mouth daily with breakfast.   Yes [provider]  rosuvastatin (CRESTOR) 40 MG tablet Take 20 mg by mouth at bedtime.   Yes [provider]  tacrolimus (PROGRAF) 1 MG capsule Take 1 mg by mouth 2 (two) times daily.   Yes [provider]  tamsulosin (FLOMAX) 0.4 MG CAPS capsule Take 0.4 mg by mouth daily. 05/30/21  Yes [provider]  torsemide (DEMADEX) 20 MG tablet Take 20 mg by mouth daily.   Yes [provider]  carvedilol (COREG) 25 MG tablet Take 25 mg by mouth in the morning and at bedtime.    [provider]  sacubitril-valsartan (ENTRESTO) 49-51 MG 0.5 tablets in the morning and at bedtime. 04/01/21 04/02/22  [provider]   Current Facility-Administered Medications  Medication Dose Route Frequency Provider Last Rate Last Admin   acetaminophen (TYLENOL) tablet 650 mg  650 mg Oral Q6H PRN Norval Morton, MD       Or   acetaminophen (TYLENOL) suppository 650 mg  650 mg Rectal Q6H PRN Fuller Plan A, MD       albuterol (PROVENTIL) (2.5 MG/3ML) 0.083% nebulizer solution 2.5 mg  2.5 mg Nebulization Q6H PRN Norval Morton, MD       Chlorhexidine  Gluconate Cloth 2 % PADS 6 each  6 each Topical Daily Delo, Douglas, MD       insulin aspart (novoLOG) injection 0-6 Units  0-6 Units Subcutaneous Q6H Smith, Rondell A, MD       ondansetron (ZOFRAN) tablet 4 mg  4 mg Oral Q6H PRN Fuller Plan A, MD       Or   ondansetron (ZOFRAN) injection 4 mg  4 mg Intravenous Q6H PRN Norval Morton, MD       [START ON 07/26/2021] pantoprazole (PROTONIX) injection 40 mg  40 mg Intravenous Q12H Milus Banister, MD       pantoprozole (PROTONIX) 80 mg /NS 100 mL infusion  8 mg/hr Intravenous Continuous Milus Banister, MD 10 mL/hr at 07/23/21 1320 8 mg/hr at 07/23/21 1320   rosuvastatin (CRESTOR) tablet 20 mg  20 mg Oral QHS Smith, Rondell A, MD       sodium chloride flush (NS) 0.9 % injection 3 mL  3 mL Intravenous Q12H Smith, Rondell A, MD       tamsulosin (FLOMAX) capsule 0.4  mg  0.4 mg Oral Daily Norval Morton, MD       Current Outpatient Medications  Medication Sig Dispense Refill   acetaminophen (TYLENOL) 500 MG tablet Take 1,000 mg by mouth every 6 (six) hours as needed for mild pain.     apixaban (ELIQUIS) 5 MG TABS tablet Take 5 mg by mouth 2 (two) times daily.     calcium acetate (PHOSLO) 667 MG capsule Take 667 mg by mouth 3 (three) times daily with meals.     doxercalciferol (HECTOROL) 0.5 MCG capsule Take 0.5 mcg by mouth daily.     insulin aspart (NOVOLOG) 100 UNIT/ML injection Inject 0-4 Units into the skin See admin instructions. Per sliding scale as needed for blood sugar     insulin aspart protamine- aspart (NOVOLOG MIX 70/30) (70-30) 100 UNIT/ML injection Inject 8-12 Units into the skin in the morning and at bedtime. 12 units in the morning 8 units with dinner     lidocaine-prilocaine (EMLA) cream Apply 1 application topically See admin instructions. Monday,tuesday,thursday,friday with dialysis     mupirocin ointment (BACTROBAN) 2 % Apply 1 application topically See admin instructions. After dialysis     predniSONE (DELTASONE) 5 MG  tablet Take 5 mg by mouth daily with breakfast.     rosuvastatin (CRESTOR) 40 MG tablet Take 20 mg by mouth at bedtime.     tacrolimus (PROGRAF) 1 MG capsule Take 1 mg by mouth 2 (two) times daily.     tamsulosin (FLOMAX) 0.4 MG CAPS capsule Take 0.4 mg by mouth daily.     torsemide (DEMADEX) 20 MG tablet Take 20 mg by mouth daily.     carvedilol (COREG) 25 MG tablet Take 25 mg by mouth in the morning and at bedtime.     sacubitril-valsartan (ENTRESTO) 49-51 MG 0.5 tablets in the morning and at bedtime.     Labs: Basic Metabolic Panel: Recent Labs  Lab 07/23/21 0458  NA 134*  K 3.9  CL 91*  CO2 30  GLUCOSE 199*  BUN 56*  CREATININE 5.54*  CALCIUM 8.5*   Liver Function Tests: Recent Labs  Lab 07/23/21 0458  AST 12*  ALT 33  ALKPHOS 97  BILITOT 0.6  PROT 5.4*  ALBUMIN 2.9*   CBC: Recent Labs  Lab 07/23/21 0458 07/23/21 0745  WBC 8.6 10.9*  NEUTROABS 6.9 8.9*  HGB 5.5* 4.4*  HCT 16.5* 13.4*  MCV 90.2 89.3  PLT SPECIMEN CLOTTED 135*   Studies/Results: CT ANGIO GI BLEED  Result Date: 07/23/2021 CLINICAL DATA:  GI bleed with maroon and read bloody stools. Anticoagulation with Eliquis. Liver biopsy at [facility] 4 weeks ago requiring subsequent ERCP and clot removal from the biliary tree for hemobilia. Sphincterotomy and ampullary biopsy at that time. History of end-stage renal disease. EXAM: CTA ABDOMEN AND PELVIS WITHOUT AND WITH CONTRAST TECHNIQUE: Multidetector CT imaging of the abdomen and pelvis was performed using the standard protocol during bolus administration of intravenous contrast. Multiplanar reconstructed images and MIPs were obtained and reviewed to evaluate the vascular anatomy. CONTRAST:  145mL OMNIPAQUE IOHEXOL 350 MG/ML SOLN COMPARISON:  Reports from prior CT studies at [facility]. Imaging is not immediately available for comparison. FINDINGS: VASCULAR Aorta: Mild scattered calcified plaque. No evidence of aneurysm, dissection or stenosis. Celiac: Normally  patent. Normally patent branch vessels and branch vessel anatomy. SMA: Normally patent. Renals: Surgically ligated native renal arteries. IMA: Normally patent. Inflow: Atherosclerosis of bilateral iliac arteries without evidence of aneurysm or stenosis. Proximal Outflow: Normally patent bilateral  common femoral arteries and femoral bifurcations. Veins: Venous phase imaging demonstrates normally patent venous structures throughout the abdomen and pelvis. Review of the MIP images confirms the above findings. NON-VASCULAR Lower chest: No acute abnormality. Hepatobiliary: Innumerable small cystic lesions are seen scattered throughout the entire liver which were described by prior outside CT reports at [facility]. Some of these are clearly simple cysts. Some of the smaller areas may represent biliary hamartomas. No hemorrhagic lesions or obvious solid enhancing lesions are seen in the liver by CT. No evidence intraparenchymal or subcapsular hemorrhage. There is evidence of biliary dilatation primarily involving the extrahepatic bile ducts. Dilated bile ducts contain diffusely high density material measuring up to 58 Hounsfield units and consistent with blood. Density of blood in the extrahepatic bile ducts does not change on the various phases of imaging to implicate rapid bleeding. The proximal common bile duct measures up to 17-18 mm in maximum caliber and the duct tapers down to approximately 8-9 mm distally at the level of the pancreatic head. No obvious obstructing mass, stricture or calcified calculi within the bile ducts. Pancreas: Unremarkable. No pancreatic ductal dilatation, mass or surrounding inflammatory changes. Spleen: Normal in size without focal abnormality. Adrenals/Urinary Tract: Normal adrenal glands. Status post bilateral nephrectomy with a transplant kidney present in the left iliac fossa. The bladder is unremarkable. No evidence of transplant hydronephrosis, mass or surrounding fluid collection.  Stomach/Bowel: Bowel shows no evidence of obstruction, ileus or inflammation. On arterial and venous phases of imaging, there is no evidence of contrast extravasation into the gastrointestinal tract. Prior colonic anastomosis in the left lower quadrant. No evidence of free intraperitoneal air. Mild nonspecific hyperemic appearance of the gastric wall with potential mild gastric wall thickening. No evidence of ulceration or perforation. No gastrointestinal masses or varices identified. Lymphatic: No enlarged abdominal or pelvic lymph nodes identified. Reproductive: Mild prostatic enlargement. Both seminal vesicles are symmetrically enlarged. Other: No abdominal wall hernia or abnormality. Small amount of free fluid around the liver. Musculoskeletal: Degenerative disc disease at L5-S1. No fractures or bony lesions. IMPRESSION: 1. Dilated extrahepatic bile ducts with evidence high density material throughout the ducts consistent with blood. The proximal common bile duct measures up to 17-18 mm in tapers down to 8-9 mm distally. No evidence of active contrast extravasation into the biliary tree by CTA. 2. No evidence of active gastrointestinal tract bleeding by CTA. 3. Small amount of free fluid around the liver without evidence intraparenchymal or subcapsular hemorrhage. 4. Nonspecific mild, diffuse gastric wall thickening with suggestion of hyperemia. This may be consistent with gastritis. No overt gastric ulceration, mass or active hemorrhage identified. No varices identified. Electronically Signed   By: Aletta Edouard M.D.   On: 07/23/2021 12:05    Dialysis Orders:  [facility], Dr. Posey Pronto is his OP nephrologist. MTuThF schedule, EDW 74.5kg - Heparin 2000 unit q HD - Mircera 262mcg IV q 2 weeks - s/p 1st dose on 8/29. - Hgb 13.1 (06/20/21), 8.6 (07/12/21)  Assessment/Plan:  Acute GIB: Transfusing 3rd of 3U PRBCs now, Kcentra given, PPI infusion started. GI consulted. CT angio with dilated extrahepatic  ducts with blood within. Per GI note - likely bled from original liver puncture or sphincterotomy site. Has been accepted as transfer to [facility] once bed available. Post-transfusion Hgb pending. S/p ESA earlier this week. Recent biliary bleed s/p liver biopsy ESRD:  On HHD [facility] - usual MTuThF schedule, s/p 3 of 4 HD this week. Labs stable for now, but suspect K will start to  climb with repeated transfusions and has been given a lot of volume with blood products. Will plan on short HD tonight.  Hypotension/volume: BP low on admit, volume resuscitated with blood products and IVF.  Metabolic bone disease: Ca ok, Phos pending.  Atrial flutter: Home Eliquis on hold, Kcentra given.  Veneta Penton, PA-C 07/23/2021, 1:35 PM  [employer]

## 2021-07-23 NOTE — Progress Notes (Signed)
Called report to [place], Therapist, sports on accepting unit [id number] at [facility]. [facility] in route.

## 2021-07-23 NOTE — Progress Notes (Signed)
FFP started and 15 min check complete. VSS. [facility] here to transport patient.

## 2021-07-23 NOTE — Progress Notes (Signed)
Pt admitted to 5W34 at the same time that [facility] called stating they have an available bed for patient. [facility] called and coming within the hour for transfer. 3rd unit of PRBCs is almost complete. If time allows, will start unit of FFP per order. [facility] states they can transfer with the FFP. Patient a&ox4. VSS. Denies pain at this time. Wife at bedside.

## 2021-07-23 NOTE — Discharge Summary (Addendum)
Physician Discharge Summary  Nicholas Glenn NWG:[id number] DOB: 1956/06/07 DOA: 07/23/2021  PCP: Jory Sims, MD  Admit date: 07/23/2021 Discharge date: 07/23/2021   Discharge Condition: Stable Disposition: Transfer to [facility]  Diet recommendation: Clear liquid  Filed Weights   07/23/21 0840  Weight: 74.5 kg    History of present illness:   Nicholas Glenn is a 65 y.o. 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  Patient had just recently been hospitalized at [facility] 3 weeks ago where he underwent a liver biopsy while in the process of being evaluated for possible transplant which led to bleeding into his gallbladder for which GI had to perform an ERCP to remove clots from his bile duct.  Anticoagulation of Eliquis was held for 72 hours prior to being restarted at his discharge.  Since getting discharged from hospital patient reported that his stools were initially dark, but had seemed to return to its normal color last week.  This week he had an iron infusion with hemodialysis on Monday.  Normally dialyzes at home on Monday, Tuesday, Thursday, and Fridays.  4 days ago he he missed dialysis due to feeling sick and nauseous with reports of low blood pressures.  2 days ago he also missed his hemodialysis session after checking his orthostatic vital signs and sitting down to start hemodialysis because he passed out.  Wife estimates he lost consciousness for approximately 10 seconds.  EMS was called and patient blood pressures improved after getting him in the Trendelenburg position.  He followed up with his cardiologist later that day where blood work was done including blood cultures and he was recommended to  hold Coreg and Restoril.  He had continued to take Eliquis and his last dose was last night.  His wife notes that they did eventually find out that his hemoglobin was down to 7 g/dL today.  Patient did report that stools had become dark again, but he attributes that to the iron infusion that he received earlier in the week.  He was able to have a full hemodialysis session yesterday.  This morning he had gotten up around 1 AM and use the bathroom and had a loose stool, but did not look at the stool at that time.  He woke again at around 4 AM and pulled off his CPAP Pap mask and complained of feeling dizzy before passing out.  After coming back to patient trying to get up and take a step but passed out again for which his wife caught him and laid him down to the floor.  Patient reported having urgency to have a bowel movement, but was unable to get up and had a maroon-colored stool.  He did admit to intermittently having some upper right quadrant discomfort similar to what he experienced when he had the bleed earlier this month, but less severe.  Currently denies any complaints of fever, shortness of breath, leg swelling, chest pain, nausea, or vomiting.   ED Course: Upon admission into the emergency department patient was noted to be afebrile, blood pressure 91/48-115/67, and O2 saturations maintained on room air.  Labs significant for hemoglobin 5.5, platelets 35, sodium 134, potassium 3.9, BUN 56, and BUN 5.4.  Stool guaiac was noted to be positive.  Influenza and COVID-19 screening was negative.  Gastroenterology was consultedPatient was typed and screened and ordered 2 units of packed red blood cells to be transfused and Kcentra.  Given recent hospitalization at [facility] transfer to [facility] was requested and patient was accepted by Dessie Coma, MD hospitalist, but no beds were currently available.  PCCM was asked to evaluate patient, but recommended stepdown bed.  GI recommended obtaining a CT angiogram to see if a  possible source of bleeding could be found and to start him on a Protonix drip.  Hospital Course:   Acute blood loss anemia secondary to GI bleed history of recent GI bleeding: Patient presents after having 2 loose stools this morning.  The second which was noted to be maroon in color.  Hemoglobin reported to have dropped from 7 g/dL on Thursday down to as low as 4.4 g/dL here today.  Stool guaiacs were noted to be positive.  He was typed and screened and given Kcentra to reverse Eliquis that he last taken yesterday evening.  GI had been formally consulted and started the patient on a Protonix drip.  Previous bleeding was related to liver biopsy resulting in hemobilia requiring ERCP to remove clots from bile ducts.  Patient had been started on a Protonix drip, transfused at least 3 units of packed red blood blood cells, and transfusion started of 1 unit of FFP prior to transport.  CT angiogram which was obtained noted dilated extrahepatic bile ducts with evidence of high density material throughout the ducts consistent with blood with the proximal common bile duct measuring up to 17-18 mm with no active contrast extravasation into the biliary tree appreciated.  GI plan to monitor patient with plans for possible need of repeat ERCP if hemoglobin continues to trend down or patient started developing symptoms. -Patient will need hemoglobin rechecked posttransfusion   Hypotension: Resolved initially blood pressures as low as 91/48.  Blood pressure seem to improve after transfusion of blood was started.  Patient had already been holding Coreg and Entresto.  Blood pressures improved with transfusion of blood products and  MAP remained greater than 65.  Hypotension thought more likely secondary to acute blood loss and less likely concern for adrenal insufficiency.   ESRD on HD history of  kidney transplant: Patient with polycystic kidney disease status post living donor kidney transplant complicated by graft  graft failure on chronic immunosuppressive therapy.  Normally dialyzes Monday, Tuesday, Thursday, and Friday.  He missed hemodialysis on Tuesday and Thursday of this week and last dialyzed yesterday.  Labs from yesterday revealed sodium 134, potassium 3.9, BUN 36, creatinine 5.54.  Patient has a matured left upper extremity fistula, and there are plans to remove his right upper chest wall hemodialysis catheter on 9/8. Continued prednisone and and Prograf.  Nephrology have been consulted and had plan to take the patient for hemodialysis tomorrow morning.  Paroxysmal atrial flutter on anticoagulation: Patient had last taken Eliquis yesterday evening and currently appears to be in sinus rhythm.  He had completed had Kcentra reversal.  Continue to hold   Diabetes mellitus type 2: On admission glucose mildly elevated up to 199.  Last hemoglobin A1c 6.3 on 01/07/2021.  Hypoglycemic protocols were started and patient was placed on a very sensitive sliding scale insulin every 6 hours.    Heart failure with reduced EF: Chronic.  Patient appears to be hypovolemic on initial admission.  Last echocardiogram revealed EF of 45% with with elevated LA pressures and diastolic dysfunction and small pericardial  effusion from 9/1. -Strict intake and output -Daily weights -Fluid management with HD   Hyperlipidemia -Continue Crestor   Thrombocytopenia: Acute.  Platelet count noted to be 135  on repeat check. -Continue care platelet counts   OSA on CPAP -CPAP nightly per RT.  Patient's wife to bring home machine   Discharge Instructions   Allergies as of 07/23/2021       Reactions   Bee Venom Swelling   Vancomycin Other (See Comments)   Flushing - resolves when infusion is slow        Medication List     STOP taking these medications    carvedilol 25 MG tablet Commonly known as: COREG   Eliquis 5 MG Tabs tablet Generic drug: apixaban   Entresto 49-51 MG Generic drug: sacubitril-valsartan        TAKE these medications    acetaminophen 500 MG tablet Commonly known as: TYLENOL Take 1,000 mg by mouth every 6 (six) hours as needed for mild pain.   calcium acetate 667 MG capsule Commonly known as: PHOSLO Take 667 mg by mouth 3 (three) times daily with meals.   doxercalciferol 0.5 MCG capsule Commonly known as: HECTOROL Take 0.5 mcg by mouth daily.   insulin aspart 100 UNIT/ML injection Commonly known as: novoLOG Inject 0-4 Units into the skin See admin instructions. Per sliding scale as needed for blood sugar   insulin aspart protamine- aspart (70-30) 100 UNIT/ML injection Commonly known as: NOVOLOG MIX 70/30 Inject 8-12 Units into the skin in the morning and at bedtime. 12 units in the morning 8 units with dinner   lidocaine-prilocaine cream Commonly known as: EMLA Apply 1 application topically See admin instructions. Monday,tuesday,thursday,friday with dialysis   mupirocin ointment 2 % Commonly known as: BACTROBAN Apply 1 application topically See admin instructions. After dialysis   pantoprazole 80 mg/100 mL (0.8 mg/mL) Soln Commonly known as: PROTONIX Inject 8 mg/hr into the vein continuous.   predniSONE 5 MG tablet Commonly known as: DELTASONE Take 5 mg by mouth daily with breakfast.   rosuvastatin 40 MG tablet Commonly known as: CRESTOR Take 20 mg by mouth at bedtime.   tacrolimus 1 MG capsule Commonly known as: PROGRAF Take 1 mg by mouth 2 (two) times daily.   tamsulosin 0.4 MG Caps capsule Commonly known as: FLOMAX Take 0.4 mg by mouth daily.   torsemide 20 MG tablet Commonly known as: DEMADEX Take 20 mg by mouth daily.       Allergies  Allergen Reactions   Bee Venom Swelling   Vancomycin Other (See Comments)    Flushing - resolves when infusion is slow    The results of significant diagnostics from this hospitalization (including imaging, microbiology, ancillary and laboratory) are listed below for reference.    Significant  Diagnostic Studies: CT ANGIO GI BLEED  Result Date: 07/23/2021 CLINICAL DATA:  GI bleed with maroon and read bloody stools. Anticoagulation with Eliquis. Liver biopsy at [facility] 4 weeks ago requiring subsequent ERCP and clot removal from the biliary tree for hemobilia. Sphincterotomy and ampullary biopsy at that time. History of end-stage renal disease. EXAM: CTA ABDOMEN AND PELVIS WITHOUT AND WITH CONTRAST TECHNIQUE: Multidetector CT imaging of the abdomen and pelvis was performed using the standard protocol during bolus administration of intravenous contrast. Multiplanar reconstructed images and MIPs were obtained and reviewed to evaluate the vascular anatomy. CONTRAST:  129mL OMNIPAQUE IOHEXOL 350 MG/ML SOLN COMPARISON:  Reports from prior CT studies at [facility]. Imaging is not immediately available for comparison.  FINDINGS: VASCULAR Aorta: Mild scattered calcified plaque. No evidence of aneurysm, dissection or stenosis. Celiac: Normally patent. Normally patent branch vessels and branch vessel anatomy. SMA: Normally patent. Renals: Surgically ligated native renal arteries. IMA: Normally patent. Inflow: Atherosclerosis of bilateral iliac arteries without evidence of aneurysm or stenosis. Proximal Outflow: Normally patent bilateral common femoral arteries and femoral bifurcations. Veins: Venous phase imaging demonstrates normally patent venous structures throughout the abdomen and pelvis. Review of the MIP images confirms the above findings. NON-VASCULAR Lower chest: No acute abnormality. Hepatobiliary: Innumerable small cystic lesions are seen scattered throughout the entire liver which were described by prior outside CT reports at [facility]. Some of these are clearly simple cysts. Some of the smaller areas may represent biliary hamartomas. No hemorrhagic lesions or obvious solid enhancing lesions are seen in the liver by CT. No evidence intraparenchymal or subcapsular hemorrhage. There is evidence of biliary  dilatation primarily involving the extrahepatic bile ducts. Dilated bile ducts contain diffusely high density material measuring up to 58 Hounsfield units and consistent with blood. Density of blood in the extrahepatic bile ducts does not change on the various phases of imaging to implicate rapid bleeding. The proximal common bile duct measures up to 17-18 mm in maximum caliber and the duct tapers down to approximately 8-9 mm distally at the level of the pancreatic head. No obvious obstructing mass, stricture or calcified calculi within the bile ducts. Pancreas: Unremarkable. No pancreatic ductal dilatation, mass or surrounding inflammatory changes. Spleen: Normal in size without focal abnormality. Adrenals/Urinary Tract: Normal adrenal glands. Status post bilateral nephrectomy with a transplant kidney present in the left iliac fossa. The bladder is unremarkable. No evidence of transplant hydronephrosis, mass or surrounding fluid collection. Stomach/Bowel: Bowel shows no evidence of obstruction, ileus or inflammation. On arterial and venous phases of imaging, there is no evidence of contrast extravasation into the gastrointestinal tract. Prior colonic anastomosis in the left lower quadrant. No evidence of free intraperitoneal air. Mild nonspecific hyperemic appearance of the gastric wall with potential mild gastric wall thickening. No evidence of ulceration or perforation. No gastrointestinal masses or varices identified. Lymphatic: No enlarged abdominal or pelvic lymph nodes identified. Reproductive: Mild prostatic enlargement. Both seminal vesicles are symmetrically enlarged. Other: No abdominal wall hernia or abnormality. Small amount of free fluid around the liver. Musculoskeletal: Degenerative disc disease at L5-S1. No fractures or bony lesions. IMPRESSION: 1. Dilated extrahepatic bile ducts with evidence high density material throughout the ducts consistent with blood. The proximal common bile duct measures up  to 17-18 mm in tapers down to 8-9 mm distally. No evidence of active contrast extravasation into the biliary tree by CTA. 2. No evidence of active gastrointestinal tract bleeding by CTA. 3. Small amount of free fluid around the liver without evidence intraparenchymal or subcapsular hemorrhage. 4. Nonspecific mild, diffuse gastric wall thickening with suggestion of hyperemia. This may be consistent with gastritis. No overt gastric ulceration, mass or active hemorrhage identified. No varices identified. Electronically Signed   By: Aletta Edouard M.D.   On: 07/23/2021 12:05    Microbiology: Recent Results (from the past 240 hour(s))  Resp Panel by RT-PCR (Flu A&B, Covid) Nasopharyngeal Swab     Status: None   Collection Time: 07/23/21  6:36 AM   Specimen: Nasopharyngeal Swab; Nasopharyngeal(NP) swabs in vial transport medium  Result Value Ref Range Status   SARS Coronavirus 2 by RT PCR NEGATIVE NEGATIVE Final    Comment: (NOTE) SARS-CoV-2 target nucleic acids are NOT DETECTED.  The SARS-CoV-2 RNA  is generally detectable in upper respiratory specimens during the acute phase of infection. The lowest concentration of SARS-CoV-2 viral copies this assay can detect is 138 copies/mL. A negative result does not preclude SARS-Cov-2 infection and should not be used as the sole basis for treatment or other patient management decisions. A negative result may occur with  improper specimen collection/handling, submission of specimen other than nasopharyngeal swab, presence of viral mutation(s) within the areas targeted by this assay, and inadequate number of viral copies(<138 copies/mL). A negative result must be combined with clinical observations, patient history, and epidemiological information. The expected result is Negative.  Fact Sheet for Patients:  EntrepreneurPulse.com.au  Fact Sheet for Healthcare Providers:  IncredibleEmployment.be  This test is no t yet  approved or cleared by the [place] FDA and  has been authorized for detection and/or diagnosis of SARS-CoV-2 by FDA under an Emergency Use Authorization (EUA). This EUA will remain  in effect (meaning this test can be used) for the duration of the COVID-19 declaration under Section 564(b)(1) of the Act, 21 U.S.C.section 360bbb-3(b)(1), unless the authorization is terminated  or revoked sooner.       Influenza A by PCR NEGATIVE NEGATIVE Final   Influenza B by PCR NEGATIVE NEGATIVE Final    Comment: (NOTE) The Xpert Xpress SARS-CoV-2/FLU/RSV plus assay is intended as an aid in the diagnosis of influenza from Nasopharyngeal swab specimens and should not be used as a sole basis for treatment. Nasal washings and aspirates are unacceptable for Xpert Xpress SARS-CoV-2/FLU/RSV testing.  Fact Sheet for Patients: EntrepreneurPulse.com.au  Fact Sheet for Healthcare Providers: IncredibleEmployment.be  This test is not yet approved or cleared by the [place] FDA and has been authorized for detection and/or diagnosis of SARS-CoV-2 by FDA under an Emergency Use Authorization (EUA). This EUA will remain in effect (meaning this test can be used) for the duration of the COVID-19 declaration under Section 564(b)(1) of the Act, 21 U.S.C. section 360bbb-3(b)(1), unless the authorization is terminated or revoked.  Performed at [facility], [place] [address], [place], [place] [address]      Labs: Basic Metabolic Panel: Recent Labs  Lab 07/23/21 0458  NA 134*  K 3.9  CL 91*  CO2 30  GLUCOSE 199*  BUN 56*  CREATININE 5.54*  CALCIUM 8.5*   Liver Function Tests: Recent Labs  Lab 07/23/21 0458  AST 12*  ALT 33  ALKPHOS 97  BILITOT 0.6  PROT 5.4*  ALBUMIN 2.9*   No results for input(s): LIPASE, AMYLASE in the last 168 hours. No results for input(s): AMMONIA in the last 168 hours. CBC: Recent Labs  Lab 07/23/21 0458  07/23/21 0745  WBC 8.6 10.9*  NEUTROABS 6.9 8.9*  HGB 5.5* 4.4*  HCT 16.5* 13.4*  MCV 90.2 89.3  PLT SPECIMEN CLOTTED 135*   Cardiac Enzymes: No results for input(s): CKTOTAL, CKMB, CKMBINDEX, TROPONINI in the last 168 hours. BNP: BNP (last 3 results) No results for input(s): BNP in the last 8760 hours.  ProBNP (last 3 results) No results for input(s): PROBNP in the last 8760 hours.  CBG: No results for input(s): GLUCAP in the last 168 hours.  Principal Problem:   GI bleed Active Problems:   Hypotension   ESRD on hemodialysis (HCC)   PAF (paroxysmal atrial fibrillation) (HCC)   Chronic anticoagulation   Diabetes mellitus type 2, controlled ([place])   Heart failure with reduced ejection fraction (HCC)   OSA on CPAP   Time coordinating discharge: 30 minutes  Signed:  Fuller Plan MD [facility] 07/23/2021, 4:10 PM

## 2021-07-23 NOTE — Consult Note (Addendum)
Referring Provider:  EDP Primary Care Physician:  Jory Sims, MD Primary Gastroenterologist:  [facility]  Reason for Consultation:  GI bleed  HPI: Nicholas Glenn is a 65 y.o. 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  Nicholas Glenn underwent trans-jugular liver biopsy with IR at [facility] on 06/22/21 for evaluation of ascites and unknown origin (remained on Apixaban during biopsy). He then presented to the [facility] ED on 06/26/21 with rapid-onset RUQ abdominal pain, nausea, and dizziness. Labs at the time notable for WBC 12.5, Hgb 13.2, Plt 106, Cr 8.0, BUN 86, AST 152, ALT 105, Tbili 1.6, ALP 101. CT GI bleed protocol suggestive of hemobilia but without evidence of active hemorrhage. He was observed in the ED and discharged home after repeat H&H was stable. He experienced recurrence of severe pain and nausea at home, prompting return to the ED on 06/28/21, where CT without contrast showed moderate common bile duct dilation (1.3 cm), no intrahepatic bile duct dilation, high density material in the gallbladder.Labs suggestive of ongoing biliary obstruction (total bilirubin peak 3.1 on 8/10). ERCP done 48h after his last apixaban dose on 8/11 with sphincterotomy; findings of moderate bile duct dilation and filling defects thought to be clots. Clots were swept from the duct and a biopsy of the ampulla was performed. He was free of pain after his procedure, and tolerated progressive advancement of his diet. Labs showed a downtrend of bilirubin, AST and ALT. Alk phose slightly elevated likely secondary to the procedure.  Plan was to hold Apixaban 72 hours after ERCP, restart 07/03/2021 in the evening.  The above bolded information is from  discharge summary at [facility].  He is here at [facility] now with GI bleeding.  Was having brown stools then starting passing painless red rectal bleeding followed by maroon stools.  Hypotensive.  Hgb 5.5 grams, 4.4 grams on repeat.  LFTs normal.  Has only received one unit of PRBCs so far.  CTA as follows:  IMPRESSION: 1. Dilated extrahepatic bile ducts with evidence high density material throughout the ducts consistent with blood. The proximal common bile duct measures up to 17-18 mm in tapers down to 8-9 mm distally. No evidence of active contrast extravasation into the biliary tree by CTA. 2. No evidence of active gastrointestinal tract bleeding by CTA. 3. Small amount of free fluid around the liver without evidence intraparenchymal or subcapsular hemorrhage. 4. Nonspecific mild, diffuse gastric wall thickening with suggestion of hyperemia. This may be consistent with gastritis. No overt gastric ulceration, mass or active hemorrhage identified. No varices identified.  Hgb was 7.0 grams on 9/1.  He has been accepted for transfer at [facility] but they are awaiting a bed.  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 Authorizing Provider  acetaminophen (  TYLENOL) 500 MG tablet Take 1,000 mg by mouth every 6 (six) hours as needed for mild pain.   Yes [provider]  apixaban (ELIQUIS) 5 MG TABS tablet Take 5 mg by mouth 2 (two) times daily.   Yes [provider]  calcium  acetate (PHOSLO) 667 MG capsule Take 667 mg by mouth 3 (three) times daily with meals.   Yes [provider]  doxercalciferol (HECTOROL) 0.5 MCG capsule Take 0.5 mcg by mouth daily.   Yes [provider]  insulin aspart (NOVOLOG) 100 UNIT/ML injection Inject 0-4 Units into the skin See admin instructions. Per sliding scale as needed for blood sugar   Yes [provider]  insulin aspart protamine- aspart (NOVOLOG MIX 70/30) (70-30) 100 UNIT/ML injection Inject 8-12 Units into the skin in the morning and at bedtime. 12 units in the morning 8 units with dinner   Yes [provider]  lidocaine-prilocaine (EMLA) cream Apply 1 application topically See admin instructions. Monday,tuesday,thursday,friday with dialysis 06/20/21  Yes [provider]  mupirocin ointment (BACTROBAN) 2 % Apply 1 application topically See admin instructions. After dialysis 06/20/21  Yes [provider]  predniSONE (DELTASONE) 5 MG tablet Take 5 mg by mouth daily with breakfast.   Yes [provider]  rosuvastatin (CRESTOR) 40 MG tablet Take 20 mg by mouth at bedtime.   Yes [provider]  tacrolimus (PROGRAF) 1 MG capsule Take 1 mg by mouth 2 (two) times daily.   Yes [provider]  tamsulosin (FLOMAX) 0.4 MG CAPS capsule Take 0.4 mg by mouth daily. 05/30/21  Yes [provider]  torsemide (DEMADEX) 20 MG tablet Take 20 mg by mouth daily.   Yes [provider]  carvedilol (COREG) 25 MG tablet Take 25 mg by mouth in the morning and at bedtime.    [provider]  sacubitril-valsartan (ENTRESTO) 49-51 MG 0.5 tablets in the morning and at bedtime. 04/01/21 04/02/22  [provider]    Current Facility-Administered Medications  Medication Dose Route Frequency Provider Last Rate Last Admin   acetaminophen (TYLENOL) tablet 650 mg  650 mg Oral Q6H PRN Clydie Braun, MD       Or   acetaminophen (TYLENOL) suppository 650  mg  650 mg Rectal Q6H PRN Madelyn Flavors A, MD       albuterol (PROVENTIL) (2.5 MG/3ML) 0.083% nebulizer solution 2.5 mg  2.5 mg Nebulization Q6H PRN Clydie Braun, MD       Chlorhexidine Gluconate Cloth 2 % PADS 6 each  6 each Topical Daily Delo, Douglas, MD       insulin aspart (novoLOG) injection 0-6 Units  0-6 Units Subcutaneous Q6H Smith, Rondell A, MD       ondansetron (ZOFRAN) tablet 4 mg  4 mg Oral Q6H PRN Madelyn Flavors A, MD       Or   ondansetron (ZOFRAN) injection 4 mg  4 mg Intravenous Q6H PRN Clydie Braun, MD       [START ON 07/26/2021] pantoprazole (PROTONIX) injection 40 mg  40 mg Intravenous Q12H Rachael Fee, MD       pantoprozole (PROTONIX) 80 mg /NS 100 mL infusion  8 mg/hr Intravenous Continuous Rachael Fee, MD       rosuvastatin (CRESTOR) tablet 20 mg  20 mg Oral QHS Smith, Rondell A, MD       sodium chloride flush (NS) 0.9 % injection 3 mL  3 mL Intravenous Q12H Clydie Braun, MD  tamsulosin (FLOMAX) capsule 0.4 mg  0.4 mg Oral Daily Norval Morton, MD       Current Outpatient Medications  Medication Sig Dispense Refill   acetaminophen (TYLENOL) 500 MG tablet Take 1,000 mg by mouth every 6 (six) hours as needed for mild pain.     apixaban (ELIQUIS) 5 MG TABS tablet Take 5 mg by mouth 2 (two) times daily.     calcium acetate (PHOSLO) 667 MG capsule Take 667 mg by mouth 3 (three) times daily with meals.     doxercalciferol (HECTOROL) 0.5 MCG capsule Take 0.5 mcg by mouth daily.     insulin aspart (NOVOLOG) 100 UNIT/ML injection Inject 0-4 Units into the skin See admin instructions. Per sliding scale as needed for blood sugar     insulin aspart protamine- aspart (NOVOLOG MIX 70/30) (70-30) 100 UNIT/ML injection Inject 8-12 Units into the skin in the morning and at bedtime. 12 units in the morning 8 units with dinner     lidocaine-prilocaine (EMLA) cream Apply 1 application topically See admin instructions. Monday,tuesday,thursday,friday with dialysis      mupirocin ointment (BACTROBAN) 2 % Apply 1 application topically See admin instructions. After dialysis     predniSONE (DELTASONE) 5 MG tablet Take 5 mg by mouth daily with breakfast.     rosuvastatin (CRESTOR) 40 MG tablet Take 20 mg by mouth at bedtime.     tacrolimus (PROGRAF) 1 MG capsule Take 1 mg by mouth 2 (two) times daily.     tamsulosin (FLOMAX) 0.4 MG CAPS capsule Take 0.4 mg by mouth daily.     torsemide (DEMADEX) 20 MG tablet Take 20 mg by mouth daily.     carvedilol (COREG) 25 MG tablet Take 25 mg by mouth in the morning and at bedtime.     sacubitril-valsartan (ENTRESTO) 49-51 MG 0.5 tablets in the morning and at bedtime.      Allergies as of 07/23/2021 - Review Complete 07/23/2021  Allergen Reaction Noted   Bee venom Swelling 01/01/2019   Vancomycin Other (See Comments) 01/01/2019    History reviewed. No pertinent family history.  Social History   Socioeconomic History   Marital status: Married    Spouse name: Webb Silversmith   Number of children: Not on file   Years of education: Not on file   Highest education level: Doctorate  Occupational History   Occupation: Retired  Tobacco Use   Smoking status: Never   Smokeless tobacco: Never  Vaping Use   Vaping Use: Never used  Substance and Sexual Activity   Alcohol use: Never   Drug use: Never   Sexual activity: Not on file  Other Topics Concern   Not on file  Social History Narrative   Not on file   Social Determinants of Health   Financial Resource Strain: Not on file  Food Insecurity: Not on file  Transportation Needs: Not on file  Physical Activity: Not on file  Stress: Not on file  Social Connections: Not on file  Intimate Partner Violence: Not on file    Review of Systems: ROS is O/W negative except as mentioned in HPI.  Physical Exam: Vital signs in last 24 hours: Temp:  [97.1 F (36.2 C)-99 F (37.2 C)] 99 F (37.2 C) (09/03 1140) Pulse Rate:  [85-99] 90 (09/03 1140) Resp:  [2-19] 18 (09/03  1140) BP: (91-152)/(45-67) 146/45 (09/03 1140) SpO2:  [96 %-100 %] 100 % (09/03 1140) Weight:  [74.5 kg] 74.5 kg (09/03 0840)   General:  Alert, Well-developed,  well-nourished, pleasant and cooperative in NAD Head:  Normocephalic and atraumatic. Eyes:  Sclera clear, no icterus.  Conjunctiva pink. Ears:  Normal auditory acuity. Mouth:  No deformity or lesions.   Lungs:  Clear throughout to auscultation.  No wheezes, crackles, or rhonchi.  Heart:  Regular rate and rhythm; no murmurs, clicks, rubs, or gallops. Abdomen:  Soft, non-distended.  BS present.  Non-tender. Rectal:  Deferred  Msk:  Symmetrical without gross deformities. Pulses:  Normal pulses noted. Extremities:  Without clubbing or edema. Neurologic:  Alert and oriented x 4;  grossly normal neurologically. Skin:  Intact without significant lesions or rashes. Psych:  Alert and cooperative. Normal mood and affect.  Intake/Output from previous day: 09/02 0701 - 09/03 0700 In: 500 [IV Piggyback:500] Out: -  Intake/Output this shift: Total I/O In: 314 [Blood:314] Out: -   Lab Results: Recent Labs    07/23/21 0458 07/23/21 0745  WBC 8.6 10.9*  HGB 5.5* 4.4*  HCT 16.5* 13.4*  PLT SPECIMEN CLOTTED 135*   BMET Recent Labs    07/23/21 0458  NA 134*  K 3.9  CL 91*  CO2 30  GLUCOSE 199*  BUN 56*  CREATININE 5.54*  CALCIUM 8.5*   LFT Recent Labs    07/23/21 0458  PROT 5.4*  ALBUMIN 2.9*  AST 12*  ALT 33  ALKPHOS 97  BILITOT 0.6   PT/INR Recent Labs    07/23/21 0458  LABPROT 16.1*  INR 1.3*   IMPRESSION:  *GI bleeding s/p recent hematobilia that resulted from liver biopsy at [facility] s/p ERCP with biliary sphincterotomy, ampullary biopsy, and clot removal from the biliary tree 3 weeks ago.  Was having brown stools then starting passing painless red rectal bleeding followed by maroon stools.  LFTs are normal, seems quite a bit less likely that he is having recurrent hematobilia. Could be bleeding from  sphincterotomy site but it is a bit outside of usual window for that (generally 2 weeks).  Diverticular bleeding?  PUD bleeding?  Perhaps sarcoma is involving GI tract now causing bleeding? *Profound blood loss anemia:  Hgb 7.0 grams on 9/1, now 5.5 grams and then 4.4 grams.  Has only received one unit of PRBCs. *Chronic anticoagulation with Eliquis for atrial flutter  PLAN: *CT angio of the abdomen and pelvis. *PPI gtt. *He has been accepted for transfer to [place] since all of his care has been there, pending bed availability. *Transfuse further.  **Addendum:  CT angio read and shows high density material throughout the bile ducts c/w blood but NO active extravasation into the biliary tree and NO evidence of active GI bleeding.  Looks like he has stopped bleeding for now.  With normal LFTs he does not need any procedure currently.  Nicholas Glenn. Zehr  07/23/2021, 12:42 PM   ________________________________________________________________________  Nicholas Glenn GI MD note:  I personally examined the patient, reviewed the data and agree with the assessment and plan described above.  See my previous note from today. Since then CT angio confirms that the bleeding was coming from his biliary tree. No active bleeding noted but the findings show that he either re-bled from the original liver biopsy puncture site that caused hematobilia 3 weeks ago OR he bled from the ERCP sphincterotomy site that was needed to treat the bleeding from the liver biopsy.  Hopefully he will not need another ERCP to remove blood clots again, will have to watch his LFTs the next few days.  He is HD stable now after two units  blood and reversal of his eliquis.  He is getting a 3rd unit of blood and 1 FFP.  I think clear liquids is a safe diet for today. We will follow along.   Owens Loffler, MD [facility] Gastroenterology Pager [phone]

## 2021-07-23 NOTE — ED Provider Notes (Addendum)
Patient with history of failed kidney transplant, paroxysmal A. fib on Eliquis, hemodialysis dependency at home, history of hepatic bleeding approximately 6 weeks ago while at [facility] after a liver biopsy requiring ERCP, presents to ER with a syncopal episode and GI bleeding since last night.  Patient was signed out to me as pending possible to transfer.  ROS: No fever no cough no vomiting no diarrhea no headache no chest pain abdominal pain no flank pain no rash.  Positive rectal bleeding.  Social: Patient denies smoking drinking or drug use.  On my exam: Vital signs are soft with blood pressures ranging from 90 systolic to 294 systolic, heart rate ranging from 90 to 100 bpm.  General: Patient is awake and alert in no pain answering all questions. HEENT: Normocephalic atraumatic Heart: Regular rate and rhythm mildly tachycardic Lungs: Clear to auscultation bilaterally no wheezes or crackles Abdomen: Soft nontender no guarding rebound Muscle skeletal: Moving all extremities appropriately Neuro: Awake alert and appropriate mentation.   Patient accepted at [facility] for transfer.  Addendum: However [facility] states that they do not have a bed available now and cannot give me a definitive time when they may have a bed.  Patient will be admitted to the hospitalist team pending transfer to [facility].  Patient required multiple reevaluations during his ER stay.  Blood pressure soft but remains approximately 76-546 systolic.  Heart rate maintaining around 95 to 100 bpm.  Patient still has no complaints of pain or discomfort at this time.  Patient was ordered 2 units of blood to be transfused as well as Kcentra to reverse his Eliquis.  Discussed with critical care medicine, recommending hospitalist admission.   .Critical Care  Date/Time: 07/23/2021 10:26 AM Performed by: Luna Fuse, MD Authorized by: Luna Fuse, MD   Critical care provider statement:    Critical care time (minutes):  30    Critical care time was exclusive of:  Separately billable procedures and treating other patients and teaching time   Critical care was necessary to treat or prevent imminent or life-threatening deterioration of the following conditions:  Circulatory failure Comments:     Low blood pressure, severe anemia, GI bleed, anticoagulated requiring reversal    Luna Fuse, MD 07/23/21 0757    Luna Fuse, MD 07/23/21 1021    Luna Fuse, MD 07/23/21 1027

## 2021-07-23 NOTE — ED Notes (Signed)
Patient denies pain and is resting comfortably.  

## 2021-07-23 NOTE — Progress Notes (Signed)
Full consult note to follow;  Briefly, 65 yo man with overt GI bleeding, maroon and red rectal bleeding, in setting of eliquis, ESRF on home dialysis, recent hematobilia that resulted from liver biopsy at [facility], s/p [facility] ERCP with biliary sphincterotomy, ampullary biopsy (no sign of cancer), clot removal from biliary tree 3 weeks ago. He has known diverticular disease as well, does not take NSAIDs.  Hb was 8 early last week, 7 two days ago and 5.5 in ER down to 4.4 (after fluids, before any blood products).  Stools were black for a few days after ERCP and then brown this past week until relatively painless red rectal bleeding, then maroon stools during syncopal event early this AM.  LFTs are normal, seems quite a bit less likely that he is having recurrent hematobilia. Could be bleeding from sphincterotomy site but it is a bit outside of usual window for that (generally 2 weeks).  Diverticular bleeding?  PUD bleeding?  Perhaps sarcoma is involving GI tract now causing bleeding?  He has gotten 1 unit blood so far, will probably need 2-3 more. Kaycentra infused should reverse eliquis pretty quickly. I ordered PPI infusion and drip. I ordered CT angio (GI bleed protocol) for further evaluation. He understand that he may need further testing, treatment including EGD +/- colonoscopy pending his clinical course. Already accepted to [facility] since all of his care has been there but no beds are available yet.  Will follow along

## 2021-07-23 NOTE — ED Triage Notes (Signed)
Pt bib [facility]. Pt has had two large bowel movements with dark red stool. Pt was incontinent of stool at home. Pt had a fainting episode where wife caught him and layed him on the floor. Pt denies any injury from fall. Pt is A&Ox3 on arrival, NAD noted. Pt states his hgb was 7.6 earlier this week. Pt has had liver biopsy that caused bleeding that went to gallbladder that clogged up bile duct which then had to be cleaned out approximately three weeks ago per pt.

## 2021-07-23 NOTE — ED Provider Notes (Signed)
[facility] EMERGENCY DEPARTMENT Provider Note   CSN: [id number] Arrival date & time: 07/23/21  0424     History Chief Complaint  Patient presents with   Rectal Bleeding    Nicholas Glenn is a 65 y.o. male.  To bePatient is a 65 year old male with past medical history of, congestive heart failure, hypertension, and end-stage renal disease on hemodialysis.  Patient woke up this evening she did have a bowel movement, but went back to bed.  Shortly afterward, he became weak and dizzy, then had the urge to have a second bowel movement.  He returned to the bathroom, then had a syncopal episode after having a second bowel movement.  I am told that these bowel movements were maroon in color and obviously blood.  He denies any fevers or chills.  Patient denies any history of GI bleeds or ulcers.   Rectal Bleeding Quality:  Maroon Amount:  Moderate Chronicity:  New Relieved by:  Nothing Worsened by:  Nothing Ineffective treatments:  None tried Associated symptoms: no abdominal pain and no fever       Past Medical History:  Diagnosis Date   CHF (congestive heart failure) ([place]) 10/18/2018   Hospitalized at the [facility]   Diabetes mellitus without complication [facility])    Hypertension    Melanoma ([place]) 1997, [phone], 03/2015   Polycystic kidney disease 06/11/2012    There are no problems to display for this patient.   Past Surgical History:  Procedure Laterality Date   nephrectomy  05/29/2005   bilateral native   s/p kidney transplantation  08/14/2005   S/P livning donor kidney transplantation       History reviewed. No pertinent family history.  Social History   Tobacco Use   Smoking status: Never   Smokeless tobacco: Never  Vaping Use   Vaping Use: Never used  Substance Use Topics   Alcohol use: Never   Drug use: Never    Home Medications Prior to Admission medications   Medication Sig Start Date End Date Taking? Authorizing  Provider  acetaminophen (TYLENOL) 500 MG tablet Take 1,000 mg by mouth every 6 (six) hours as needed for mild pain.    [provider]  amLODipine (NORVASC) 5 MG tablet Take 2.5 mg by mouth at bedtime.    [provider]  apixaban (ELIQUIS) 5 MG TABS tablet Take 5 mg by mouth 2 (two) times daily.    [provider]  calcium acetate (PHOSLO) 667 MG capsule Take 667 mg by mouth 3 (three) times daily with meals.    [provider]  carvedilol (COREG) 25 MG tablet Take 25 mg by mouth in the morning and at bedtime.    [provider]  doxercalciferol (HECTOROL) 0.5 MCG capsule Take 1 mcg by mouth daily.    [provider]  insulin aspart (NOVOLOG) 100 UNIT/ML injection Inject 10 Units into the skin See admin instructions. Per sliding scale as needed for blood sugar    [provider]  insulin aspart protamine- aspart (NOVOLOG MIX 70/30) (70-30) 100 UNIT/ML injection Inject 8 Units into the skin in the morning and at bedtime.    [provider]  predniSONE (DELTASONE) 5 MG tablet Take 5 mg by mouth daily with breakfast.    [provider]  rosuvastatin (CRESTOR) 40 MG tablet Take 20 mg by mouth at bedtime.    [provider]  tacrolimus (PROGRAF) 0.5 MG capsule Take 1 mg by mouth 2 (two) times daily.  [provider]  torsemide (DEMADEX) 20 MG tablet Take 20 mg by mouth daily.    [provider]    Allergies    Bee venom and Vancomycin  Review of Systems   Review of Systems  Constitutional:  Negative for fever.  Gastrointestinal:  Positive for hematochezia. Negative for abdominal pain.  All other systems reviewed and are negative.  Physical Exam Updated Vital Signs BP (!) 108/59 (BP Location: Right Arm)   Pulse 88   Temp (!) 97.1 F (36.2 C) (Temporal)   Resp 18   SpO2 100%   Physical Exam Vitals and nursing note reviewed.  Constitutional:      General: He is not in acute  distress.    Appearance: He is well-developed. He is not diaphoretic.  HENT:     Head: Normocephalic and atraumatic.  Cardiovascular:     Rate and Rhythm: Normal rate and regular rhythm.     Heart sounds: No murmur heard.   No friction rub.  Pulmonary:     Effort: Pulmonary effort is normal. No respiratory distress.     Breath sounds: Normal breath sounds. No wheezing or rales.  Abdominal:     General: Bowel sounds are normal. There is no distension.     Palpations: Abdomen is soft.     Tenderness: There is no abdominal tenderness.  Genitourinary:    Comments: Rectal examination shows maroon-colored stool. Musculoskeletal:        General: Normal range of motion.     Cervical back: Normal range of motion and neck supple.  Skin:    General: Skin is warm and dry.  Neurological:     Mental Status: He is alert and oriented to person, place, and time.     Coordination: Coordination normal.    ED Results / Procedures / Treatments   Labs (all labs ordered are listed, but only abnormal results are displayed) Labs Reviewed - No data to display  EKG EKG Interpretation  Date/Time:  Saturday July 23 2021 04:30:17 EDT Ventricular Rate:  92 PR Interval:  167 QRS Duration: 100 QT Interval:  381 QTC Calculation: 472 R Axis:   29 Text Interpretation: Sinus rhythm Borderline repolarization abnormality Confirmed by Veryl Speak [id number] on 07/23/2021 6:20:34 AM  Radiology No results found.  Procedures Procedures   Medications Ordered in ED Medications  sodium chloride 0.9 % bolus 500 mL (has no administration in time range)    ED Course  I have reviewed the triage vital signs and the nursing notes.  Pertinent labs & imaging results that were available during my care of the patient were reviewed by me and considered in my medical decision making (see chart for details).    MDM Rules/Calculators/A&P  Patient is a 65 year old male with extensive past medical history as per  HPI.  Patient presents here after a syncopal episode preceded by 2 maroon colored bowel movements.  Patient arrives here pale appearing, but hemodynamically stable.  Work-up initiated and laboratory studies reveal a hemoglobin of 5.5.  Patient has been written for blood transfusion and will be admitted to the hospitalist service.  I spoke with Dr. Alcario Drought who agrees to admit.  CRITICAL CARE Performed by: Veryl Speak Total critical care time: 40 minutes Critical care time was exclusive of separately billable procedures and treating other patients. Critical care was necessary to treat or prevent imminent or life-threatening deterioration. Critical care was time spent personally by me on the following activities: development of treatment plan with patient  and/or surrogate as well as nursing, discussions with consultants, evaluation of patient's response to treatment, examination of patient, obtaining history from patient or surrogate, ordering and performing treatments and interventions, ordering and review of laboratory studies, ordering and review of radiographic studies, pulse oximetry and re-evaluation of patient's condition.   Final Clinical Impression(s) / ED Diagnoses Final diagnoses:  None    Rx / DC Orders ED Discharge Orders     None        Veryl Speak, MD 07/23/21 [phone]

## 2021-07-23 NOTE — Consult Note (Addendum)
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  He presented to the ED 07/23/2021 with history of syncopal episodes x 2 and new onset bloody maroon stools/ GI Bleed  in setting of chronic anticoagulation. He had also noted some painless scan rectal bleeding a few days prior to admission  . He had liver biopsy 8/3 at [facility] with bleeding post procedure that had been controlled with IR procedure ( See below) He had been seen by his cardiologist at [place], and at that time his HGB was noted  to be 7.0. Previous HGB had been 8.6, and prior to that 10.6.   In the ED HGB on arrival was 5.7( from 7.0 on 9/2) . BP was soft with systolic BP in the 97'W. Upon repeat CBC two hours later, HGB had dropped to 4.4 . 2 Units of PRBC's were ordered and orders were placed for K Centra to reverse Eliquis . Pt states he restarted his Eliquis 3 days after discharge from [facility] on 8/12 .  PCCM were asked to evaluate patient for need for ICU admission. Upon exam patient remained alert and oriented x 3. BP was 263 systolic. The first of 2 units PRBC was almost complete. Patient had not had yet had his K centra. He had not had a bloody BM since the initial 2 he had at home. Sats were 100 % on Room Air.  Per patient he had had normal brown Bowel movements last week.   GI have also been consulted to see the patient .   Labs  Reviewed 9/3 HGB 0458 Initially 5.5, then 4.4 at 0745, platelets 135 K HCT 16.5/13.4 RBC 1.83/1.50,  WBC 8.6 / 10.9 Temp 97.1 Na 134. K 3.9, Creatinine 5.54, Alk Phos 97, Albumin 2.9, AST 12, ALT 33, Total Bili 0.6  ED MD had notified [facility], who have accepted the patient for transfer, but do not have an available bed. Plan is for admission to [facility]. PCCM have been asked to evaluate to determine need for Critical Care admission vs Medicine admission to Progressive bed. After evaluation , patient is hemodynamically stable, no further bleeding , transfusion has been initiated, and K Jaclyn Prime has been ordered, Patient will be admitted to Progressive bed through [facility] with low threshold for transfer to ICU with any decompensation.     Please note, Nicholas Glenn underwent trans-jugular liver biopsy with IR at [facility] on 06/22/21 for evaluation of ascites and unknown origin (remained on Apixaban during biopsy). He then presented to [facility] ED on 06/26/21 with rapid-onset RUQ abdominal pain, nausea, and dizziness. Labs at the time notable for WBC 12.5, Hgb 13.2, Plt 106, Cr 8.0, BUN 86, AST 152, ALT 105, Tbili 1.6, ALP 101. CT GI bleed protocol suggestive of hemobilia but without evidence of active hemorrhage. He was observed in the ED and discharged home after repeat H&H was stable. He experienced recurrence of severe pain and nausea at home, prompting return to  the ED on 06/28/21, where CT without contrast showed moderate common bile duct dilation (1.3 cm), no intrahepatic bile duct dilation, high density material in the gallbladder.  Labs were suggestive of ongoing biliary obstruction (total bilirubin peak 3.1 on 8/10). ERCP done 48h after his last apixaban dose on 8/11 with sphincterotomy; findings of moderate bile duct dilation and filling defects thought to be clots. Clots were swept from the duct and a biopsy of the ampulla was performed. He was Glenn of pain after his procedure, and tolerated progressive advancement of  his diet. Labs showed a downtrend of bilirubin, AST and ALT. Alk phose slightly elevated likely secondary to the procedure.He was discharged home 8/12.  Pertinent  Medical History   Past Medical History:  Diagnosis Date   CHF (congestive heart failure) ([place]) 10/18/2018   Hospitalized at the [place]   Diabetes mellitus without complication ([place])    Hypertension    Melanoma ([place]) 1997, 2013,2014, 03/2015   Polycystic kidney disease 06/11/2012  Failed kidney Transplant, Home HD 4 days a week Metastatic Sarcoma Known diverticular disease ESRD on Home HD  Significant Hospital Events: Including procedures, antibiotic start and stop dates in addition to other pertinent events   8/3 Trans Jugular Liver Biopsy 8/11 ERCP with  biliary sphincterotomy ampullary biopsy (no sign of cancer), clot removal from biliary tree  9/3 Admission to [facility] with HGB 5.5>> 4.4   Interim History / Subjective:  Pale , in NAD. No further bleeding since arriving at the ED. First unit of Blood transfusing States he does have some abdominal cramping   Objective   Blood pressure (!) 105/50, pulse 90, temperature 98.5 F (36.9 C), temperature source Temporal, resp. rate 16, height $RemoveBe'5\' 9"'UjyzUxjrY$  (1.753 m), weight 74.5 kg, SpO2 99 %.        Intake/Output Summary (Last 24 hours) at 07/23/2021 1031 Last data filed at 07/23/2021 9528 Gross per 24 hour  Intake 814 ml  Output --  Net 814 ml   Filed Weights   07/23/21 0840  Weight: 74.5 kg    Examination: General: Pale male in NAD , supine on stretcher in ED, denies pain  HENT: NCAT, No LAD, No JVD Lungs: Bilateral chest excursion, clear throughout , slightly diminished per bases Cardiovascular: S1, S2, RRR, No MRG, mild tachycardia Abdomen: Soft, NT, ND no guarding or rebound , BS +, Body mass index is 24.25 kg/m. Extremities: Warm and dry, brisk capillary refill, No obvious deformities, no edema, LUE fistulla with + Bruit and thrill Neuro: Awake and  alert, following commands, answering all questions  GU: NA  Right Upper Chest wall HD cath , plans to remove 9/8.   Resolved Hospital Problem list     Assessment & Plan:  GI Bleed in setting of chronic anticoagulation  Complicated by recent hematobilia, Post Liver biopsy/ ERCP with biliary sphincterotomy, ampullary biopsy at [facility] 06/2021 Plan Transfuse Blood as ordered Transfuse FFP as ordered Post Transfusion CBC Trend CBC and platelets Trend LFT's  K Centra as ordered  PPI infusion per GI CT Angio ( GI Bleed protocol) per GI May need additional testing to include EGD +/- colonoscopy pending his clinical course. Transfer to [facility] ( has been accepted to admission by   Dessie Coma, MD )  once bed is available  Hypotension 2/2 Blood loss Improved after transfusion initiated Plan Hold home HTN meds for now  MAP Goal > 65  Chronic Renal Failure on Home HD 4 treatments weekly Treatment due 9/3,but held  2/2 GI Bleed Plan  Hold on HD until BP stable Consider renal consult Trend BMET and replete electrolytes as needed  Atrial Fibrillation  Currently not in fib Received K Centra in setting of GI bleeding  Plan Hold Eliquis for now in setting of GI Bleed Continue tele  monitoring  Heart Failure with reduced EF 45%, Elevated  LA pressures and diastolic dysfunction Small Pericardial Effusion Plan Strict I&O Daily weights  Fluid management with HD     OSA on CPAP Plan Nightly CPAP therapy Use home machine if possible     Best Practice (right click and "Reselect all SmartList Selections" daily)   Diet/type: NPO DVT prophylaxis: other>> PAS Hose ( GI Bleed) GI prophylaxis: PPI Lines: N/A Foley:  Yes, and it is still needed Code Status:  full code Last date of multidisciplinary goals of care discussion 9/3>> Patient and wife updated on plan of care 9/3  Labs   CBC: Recent Labs  Lab 07/23/21 0458 07/23/21 0745  WBC 8.6 10.9*  NEUTROABS 6.9 8.9*  HGB 5.5* 4.4*   HCT 16.5* 13.4*  MCV 90.2 89.3  PLT SPECIMEN CLOTTED 135*    Basic Metabolic Panel: Recent Labs  Lab 07/23/21 0458  NA 134*  K 3.9  CL 91*  CO2 30  GLUCOSE 199*  BUN 56*  CREATININE 5.54*  CALCIUM 8.5*   GFR: Estimated Creatinine Clearance: 13.8 mL/min (A) (by C-G formula based on SCr of 5.54 mg/dL (H)). Recent Labs  Lab 07/23/21 0458 07/23/21 0745  WBC 8.6 10.9*    Liver Function Tests: Recent Labs  Lab 07/23/21 0458  AST 12*  ALT 33  ALKPHOS 97  BILITOT 0.6  PROT 5.4*  ALBUMIN 2.9*   No results for input(s): LIPASE, AMYLASE in the last 168 hours. No results for input(s): AMMONIA in the last 168 hours.  ABG No results found for: PHART, PCO2ART, PO2ART, HCO3, TCO2, ACIDBASEDEF, O2SAT   Coagulation Profile: Recent Labs  Lab 07/23/21 0458  INR 1.3*    Cardiac Enzymes: No results for input(s): CKTOTAL, CKMB, CKMBINDEX, TROPONINI in the last 168 hours.  HbA1C: No results found for: HGBA1C  CBG: No results for input(s): GLUCAP in the last 168 hours.  Review of Systems:   + for syncope, bright red blood from rectum, minor stomach cramping  Past Medical History:  He,  has a past medical history of CHF (congestive heart failure) ([place]) (10/18/2018), Diabetes mellitus without complication ([place]), Hypertension, Melanoma ([place]) (1997, [phone], 03/2015), and Polycystic kidney disease (06/11/2012).   Surgical History:   Past Surgical History:  Procedure Laterality Date   nephrectomy  05/29/2005   bilateral native   s/p kidney transplantation  08/14/2005   S/P livning donor kidney transplantation     Social History:   reports that he has never smoked. He has never used smokeless tobacco. He reports that he does not drink alcohol and does not use drugs.   Family History:  His family history is not on file.   Allergies Allergies  Allergen Reactions   Bee Venom Swelling   Vancomycin Other (See Comments)    Flushing - resolves when infusion is slow      Home Medications  Prior to Admission medications   Medication Sig Start Date End Date Taking? Authorizing Provider  acetaminophen (TYLENOL) 500 MG tablet Take 1,000 mg by mouth every 6 (six) hours as needed for mild pain.   Yes [provider]  apixaban (ELIQUIS) 5 MG TABS tablet Take 5 mg by  mouth 2 (two) times daily.   Yes [provider]  calcium acetate (PHOSLO) 667 MG capsule Take 667 mg by mouth 3 (three) times daily with meals.   Yes [provider]  doxercalciferol (HECTOROL) 0.5 MCG capsule Take 0.5 mcg by mouth daily.   Yes [provider]  insulin aspart (NOVOLOG) 100 UNIT/ML injection Inject 0-4 Units into the skin See admin instructions. Per sliding scale as needed for blood sugar   Yes [provider]  insulin aspart protamine- aspart (NOVOLOG MIX 70/30) (70-30) 100 UNIT/ML injection Inject 8-12 Units into the skin in the morning and at bedtime. 12 units in the morning 8 units with dinner   Yes [provider]  lidocaine-prilocaine (EMLA) cream Apply 1 application topically See admin instructions. Monday,tuesday,thursday,friday with dialysis 06/20/21  Yes [provider]  mupirocin ointment (BACTROBAN) 2 % Apply 1 application topically See admin instructions. After dialysis 06/20/21  Yes [provider]  predniSONE (DELTASONE) 5 MG tablet Take 5 mg by mouth daily with breakfast.   Yes [provider]  rosuvastatin (CRESTOR) 40 MG tablet Take 20 mg by mouth at bedtime.   Yes [provider]  tacrolimus (PROGRAF) 1 MG capsule Take 1 mg by mouth 2 (two) times daily.   Yes [provider]  tamsulosin (FLOMAX) 0.4 MG CAPS capsule Take 0.4 mg by mouth daily. 05/30/21  Yes [provider]  torsemide (DEMADEX) 20 MG tablet Take 20 mg by mouth daily.   Yes [provider]  carvedilol (COREG) 25 MG tablet Take 25 mg by mouth in the morning and at bedtime.    [provider]  sacubitril-valsartan (ENTRESTO) 49-51 MG 0.5 tablets in the morning and at bedtime. 04/01/21 04/02/22  [provider]     Critical care time: 45 minutes    Magdalen Spatz, MSN, AGACNP-BC [place] for personal pager PCCM on call pager [phone]   PCCM available if patient re-bleeds, or becomes hemodynamically compromised and requires ICU admission  07/23/2021 12:31 PM

## 2021-07-23 NOTE — ED Notes (Signed)
Second blood bank sample obtained and sent to lab.

## 2021-07-24 LAB — TYPE AND SCREEN
ABO/RH(D): O NEG
Antibody Screen: NEGATIVE
Unit division: 0
Unit division: 0
Unit division: 0

## 2021-07-24 LAB — PREPARE FRESH FROZEN PLASMA: Unit division: 0

## 2021-07-24 LAB — BPAM RBC
Blood Product Expiration Date: 202209082359
Blood Product Expiration Date: 202209082359
Blood Product Expiration Date: 202209142359
ISSUE DATE / TIME: 202209030824
ISSUE DATE / TIME: 202209031106
ISSUE DATE / TIME: 202209031345
Unit Type and Rh: 9500
Unit Type and Rh: 9500
Unit Type and Rh: 9500

## 2021-07-24 LAB — BPAM FFP
Blood Product Expiration Date: 202209032359
ISSUE DATE / TIME: 202209031618
Unit Type and Rh: 7300

## 2022-03-30 ENCOUNTER — Observation Stay (HOSPITAL_COMMUNITY)
Admission: EM | Admit: 2022-03-30 | Discharge: 2022-04-01 | Disposition: A | Discharge status: Home / Self Care | Payer: Medicare Other | Attending: Internal Medicine | Admitting: Internal Medicine

## 2022-03-30 DIAGNOSIS — Z940 Kidney transplant status: Secondary | ICD-10-CM | POA: Diagnosis not present

## 2022-03-30 DIAGNOSIS — I5022 Chronic systolic (congestive) heart failure: Secondary | ICD-10-CM | POA: Diagnosis not present

## 2022-03-30 DIAGNOSIS — Z7982 Long term (current) use of aspirin: Secondary | ICD-10-CM | POA: Insufficient documentation

## 2022-03-30 DIAGNOSIS — Z7901 Long term (current) use of anticoagulants: Secondary | ICD-10-CM | POA: Diagnosis not present

## 2022-03-30 DIAGNOSIS — Z794 Long term (current) use of insulin: Secondary | ICD-10-CM | POA: Insufficient documentation

## 2022-03-30 DIAGNOSIS — J9600 Acute respiratory failure, unspecified whether with hypoxia or hypercapnia: Principal | ICD-10-CM

## 2022-03-30 DIAGNOSIS — I132 Hypertensive heart and chronic kidney disease with heart failure and with stage 5 chronic kidney disease, or end stage renal disease: Secondary | ICD-10-CM | POA: Diagnosis not present

## 2022-03-30 DIAGNOSIS — E1122 Type 2 diabetes mellitus with diabetic chronic kidney disease: Secondary | ICD-10-CM | POA: Diagnosis not present

## 2022-03-30 DIAGNOSIS — Z79899 Other long term (current) drug therapy: Secondary | ICD-10-CM | POA: Diagnosis not present

## 2022-03-30 DIAGNOSIS — D849 Immunodeficiency, unspecified: Secondary | ICD-10-CM | POA: Diagnosis not present

## 2022-03-30 DIAGNOSIS — T8612 Kidney transplant failure: Secondary | ICD-10-CM | POA: Diagnosis present

## 2022-03-30 DIAGNOSIS — I480 Paroxysmal atrial fibrillation: Secondary | ICD-10-CM | POA: Diagnosis present

## 2022-03-30 DIAGNOSIS — J9601 Acute respiratory failure with hypoxia: Secondary | ICD-10-CM | POA: Diagnosis not present

## 2022-03-30 DIAGNOSIS — Z992 Dependence on renal dialysis: Secondary | ICD-10-CM | POA: Diagnosis not present

## 2022-03-30 DIAGNOSIS — N400 Enlarged prostate without lower urinary tract symptoms: Secondary | ICD-10-CM | POA: Diagnosis not present

## 2022-03-30 DIAGNOSIS — N186 End stage renal disease: Secondary | ICD-10-CM | POA: Diagnosis not present

## 2022-03-30 DIAGNOSIS — U071 COVID-19: Secondary | ICD-10-CM | POA: Diagnosis present

## 2022-03-30 DIAGNOSIS — I5020 Unspecified systolic (congestive) heart failure: Secondary | ICD-10-CM | POA: Diagnosis present

## 2022-03-30 DIAGNOSIS — E119 Type 2 diabetes mellitus without complications: Secondary | ICD-10-CM

## 2022-03-30 DIAGNOSIS — R0602 Shortness of breath: Secondary | ICD-10-CM | POA: Diagnosis present

## 2022-03-30 DIAGNOSIS — G4733 Obstructive sleep apnea (adult) (pediatric): Secondary | ICD-10-CM

## 2022-03-31 ENCOUNTER — Emergency Department (HOSPITAL_COMMUNITY): Payer: Medicare Other

## 2022-03-31 ENCOUNTER — Inpatient Hospital Stay (HOSPITAL_COMMUNITY)
Admit: 2022-03-31 | Discharge: 2022-03-31 | Disposition: A | Discharge status: Home / Self Care | Payer: Medicare Other | Attending: Internal Medicine | Admitting: Internal Medicine

## 2022-03-31 ENCOUNTER — Encounter (HOSPITAL_COMMUNITY): Payer: Self-pay | Admitting: Emergency Medicine

## 2022-03-31 ENCOUNTER — Other Ambulatory Visit: Payer: Self-pay

## 2022-03-31 DIAGNOSIS — I5020 Unspecified systolic (congestive) heart failure: Secondary | ICD-10-CM

## 2022-03-31 DIAGNOSIS — E118 Type 2 diabetes mellitus with unspecified complications: Secondary | ICD-10-CM | POA: Diagnosis not present

## 2022-03-31 DIAGNOSIS — N186 End stage renal disease: Secondary | ICD-10-CM | POA: Diagnosis not present

## 2022-03-31 DIAGNOSIS — T8612 Kidney transplant failure: Secondary | ICD-10-CM | POA: Diagnosis present

## 2022-03-31 DIAGNOSIS — J9601 Acute respiratory failure with hypoxia: Secondary | ICD-10-CM

## 2022-03-31 DIAGNOSIS — D849 Immunodeficiency, unspecified: Secondary | ICD-10-CM

## 2022-03-31 DIAGNOSIS — Z7901 Long term (current) use of anticoagulants: Secondary | ICD-10-CM

## 2022-03-31 DIAGNOSIS — M7989 Other specified soft tissue disorders: Secondary | ICD-10-CM

## 2022-03-31 DIAGNOSIS — U071 COVID-19: Secondary | ICD-10-CM | POA: Diagnosis not present

## 2022-03-31 DIAGNOSIS — Z992 Dependence on renal dialysis: Secondary | ICD-10-CM

## 2022-03-31 LAB — GLUCOSE, CAPILLARY
Glucose-Capillary: 289 mg/dL — ABNORMAL HIGH (ref 70–99)
Glucose-Capillary: 310 mg/dL — ABNORMAL HIGH (ref 70–99)
Glucose-Capillary: 323 mg/dL — ABNORMAL HIGH (ref 70–99)

## 2022-03-31 LAB — COMPREHENSIVE METABOLIC PANEL
ALT: 21 U/L (ref 0–44)
AST: 22 U/L (ref 15–41)
Albumin: 3.6 g/dL (ref 3.5–5.0)
Alkaline Phosphatase: 73 U/L (ref 38–126)
Anion gap: 15 (ref 5–15)
BUN: 61 mg/dL — ABNORMAL HIGH (ref 8–23)
CO2: 29 mmol/L (ref 22–32)
Calcium: 9.1 mg/dL (ref 8.9–10.3)
Chloride: 91 mmol/L — ABNORMAL LOW (ref 98–111)
Creatinine, Ser: 5.97 mg/dL — ABNORMAL HIGH (ref 0.61–1.24)
GFR, Estimated: 10 mL/min — ABNORMAL LOW (ref >60)
Glucose, Bld: 211 mg/dL — ABNORMAL HIGH (ref 70–99)
Potassium: 3.6 mmol/L (ref 3.5–5.1)
Sodium: 135 mmol/L (ref 135–145)
Total Bilirubin: 1.4 mg/dL — ABNORMAL HIGH (ref 0.3–1.2)
Total Protein: 6.9 g/dL (ref 6.5–8.1)

## 2022-03-31 LAB — CBC WITH DIFFERENTIAL/PLATELET
Abs Immature Granulocytes: 0.18 10*3/uL — ABNORMAL HIGH (ref 0.00–0.07)
Basophils Absolute: 0 10*3/uL (ref 0.0–0.1)
Basophils Relative: 0 %
Eosinophils Absolute: 0 10*3/uL (ref 0.0–0.5)
Eosinophils Relative: 0 %
HCT: 32.3 % — ABNORMAL LOW (ref 39.0–52.0)
Hemoglobin: 11 g/dL — ABNORMAL LOW (ref 13.0–17.0)
Immature Granulocytes: 1 %
Lymphocytes Relative: 6 %
Lymphs Abs: 0.9 10*3/uL (ref 0.7–4.0)
MCH: 32.5 pg (ref 26.0–34.0)
MCHC: 34.1 g/dL (ref 30.0–36.0)
MCV: 95.6 fL (ref 80.0–100.0)
Monocytes Absolute: 0.6 10*3/uL (ref 0.1–1.0)
Monocytes Relative: 4 %
Neutro Abs: 13.3 10*3/uL — ABNORMAL HIGH (ref 1.7–7.7)
Neutrophils Relative %: 89 %
Platelets: 133 10*3/uL — ABNORMAL LOW (ref 150–400)
RBC: 3.38 MIL/uL — ABNORMAL LOW (ref 4.22–5.81)
RDW: 20.2 % — ABNORMAL HIGH (ref 11.5–15.5)
WBC: 14.9 10*3/uL — ABNORMAL HIGH (ref 4.0–10.5)
nRBC: 0.3 % — ABNORMAL HIGH (ref 0.0–0.2)

## 2022-03-31 LAB — BRAIN NATRIURETIC PEPTIDE: B Natriuretic Peptide: 2491.4 pg/mL — ABNORMAL HIGH (ref 0.0–100.0)

## 2022-03-31 LAB — CBG MONITORING, ED: Glucose-Capillary: 215 mg/dL — ABNORMAL HIGH (ref 70–99)

## 2022-03-31 LAB — TROPONIN I (HIGH SENSITIVITY)
Troponin I (High Sensitivity): 45 ng/L — ABNORMAL HIGH (ref <18)
Troponin I (High Sensitivity): 43 ng/L — ABNORMAL HIGH (ref <18)

## 2022-03-31 LAB — LACTIC ACID, PLASMA
Lactic Acid, Venous: 3.4 mmol/L (ref 0.5–1.9)
Lactic Acid, Venous: 2.8 mmol/L (ref 0.5–1.9)

## 2022-03-31 LAB — HEMOGLOBIN A1C
Hgb A1c MFr Bld: 7.1 % — ABNORMAL HIGH (ref 4.8–5.6)
Mean Plasma Glucose: 157 mg/dL

## 2022-03-31 LAB — HIV ANTIBODY (ROUTINE TESTING W REFLEX): HIV Screen 4th Generation wRfx: NONREACTIVE

## 2022-03-31 MED ORDER — TACROLIMUS 1 MG PO CAPS
1.0000 mg | ORAL_CAPSULE | Freq: Two times a day (BID) | ORAL | Status: DC
Start: 2022-03-31 — End: 2022-04-01
  Administered 2022-03-31 – 2022-04-01 (×3): 1 mg via ORAL
  Filled 2022-03-31 (×3): qty 1

## 2022-03-31 MED ORDER — CHLORHEXIDINE GLUCONATE CLOTH 2 % EX PADS: 6.0000 | MEDICATED_PAD | Freq: Every day | CUTANEOUS | Status: DC

## 2022-03-31 MED ORDER — INSULIN ASPART 100 UNIT/ML IJ SOLN
0.0000 [IU] | Freq: Three times a day (TID) | INTRAMUSCULAR | Status: DC
  Administered 2022-03-31: 11 [IU] via SUBCUTANEOUS
  Administered 2022-03-31: 8 [IU] via SUBCUTANEOUS
  Administered 2022-03-31: 5 [IU] via SUBCUTANEOUS
  Administered 2022-04-01: 3 [IU] via SUBCUTANEOUS
  Administered 2022-04-01: 2 [IU] via SUBCUTANEOUS

## 2022-03-31 MED ORDER — INSULIN GLARGINE-YFGN 100 UNIT/ML ~~LOC~~ SOLN
14.0000 [IU] | Freq: Every day | SUBCUTANEOUS | Status: DC
Start: 2022-03-31 — End: 2022-04-01
  Administered 2022-03-31: 14 [IU] via SUBCUTANEOUS
  Filled 2022-03-31 (×2): qty 0.14

## 2022-03-31 MED ORDER — CALCIUM ACETATE (PHOS BINDER) 667 MG PO CAPS
1334.0000 mg | ORAL_CAPSULE | Freq: Three times a day (TID) | ORAL | Status: DC
  Administered 2022-03-31 – 2022-04-01 (×4): 1334 mg via ORAL
  Filled 2022-03-31 (×4): qty 2

## 2022-03-31 MED ORDER — TAMSULOSIN HCL 0.4 MG PO CAPS
0.4000 mg | ORAL_CAPSULE | Freq: Every day | ORAL | Status: DC
  Administered 2022-03-31 – 2022-04-01 (×2): 0.4 mg via ORAL
  Filled 2022-03-31 (×2): qty 1

## 2022-03-31 MED ORDER — FUROSEMIDE 10 MG/ML IJ SOLN: 40.0000 mg | Freq: Once | INTRAMUSCULAR | Status: DC

## 2022-03-31 MED ORDER — ACETAMINOPHEN 650 MG RE SUPP: 650.0000 mg | Freq: Four times a day (QID) | RECTAL | Status: DC | PRN

## 2022-03-31 MED ORDER — ROSUVASTATIN CALCIUM 20 MG PO TABS
20.0000 mg | ORAL_TABLET | Freq: Every day | ORAL | Status: DC
  Administered 2022-03-31: 20 mg via ORAL
  Filled 2022-03-31: qty 1

## 2022-03-31 MED ORDER — DEXAMETHASONE 6 MG PO TABS
6.0000 mg | ORAL_TABLET | Freq: Every day | ORAL | Status: DC
  Administered 2022-04-01: 6 mg via ORAL
  Filled 2022-03-31 (×2): qty 1

## 2022-03-31 MED ORDER — CARVEDILOL 12.5 MG PO TABS
12.5000 mg | ORAL_TABLET | Freq: Two times a day (BID) | ORAL | Status: DC
Start: 2022-03-31 — End: 2022-04-01
  Administered 2022-03-31 – 2022-04-01 (×2): 12.5 mg via ORAL
  Filled 2022-03-31 (×2): qty 1

## 2022-03-31 MED ORDER — ALBUTEROL SULFATE (2.5 MG/3ML) 0.083% IN NEBU: 2.5000 mg | INHALATION_SOLUTION | RESPIRATORY_TRACT | Status: DC | PRN

## 2022-03-31 MED ORDER — APIXABAN 5 MG PO TABS
5.0000 mg | ORAL_TABLET | Freq: Two times a day (BID) | ORAL | Status: DC
  Administered 2022-03-31 – 2022-04-01 (×3): 5 mg via ORAL
  Filled 2022-03-31 (×3): qty 1

## 2022-03-31 MED ORDER — SACUBITRIL-VALSARTAN 49-51 MG PO TABS
0.5000 | ORAL_TABLET | Freq: Two times a day (BID) | ORAL | Status: DC
  Administered 2022-03-31 (×2): 0.5 via ORAL
  Filled 2022-03-31 (×6): qty 0.5

## 2022-03-31 MED ORDER — IPRATROPIUM-ALBUTEROL 0.5-2.5 (3) MG/3ML IN SOLN
3.0000 mL | Freq: Once | RESPIRATORY_TRACT | Status: AC
  Administered 2022-03-31: 3 mL via RESPIRATORY_TRACT
  Filled 2022-03-31: qty 3

## 2022-03-31 MED ORDER — ACETAMINOPHEN 325 MG PO TABS: 650.0000 mg | ORAL_TABLET | Freq: Four times a day (QID) | ORAL | Status: DC | PRN

## 2022-03-31 MED ORDER — DEXAMETHASONE SODIUM PHOSPHATE 10 MG/ML IJ SOLN
10.0000 mg | Freq: Once | INTRAMUSCULAR | Status: AC
  Administered 2022-03-31: 10 mg via INTRAVENOUS
  Filled 2022-03-31: qty 1

## 2022-03-31 MED ORDER — TORSEMIDE 20 MG PO TABS
20.0000 mg | ORAL_TABLET | Freq: Every day | ORAL | Status: DC
Start: 2022-03-31 — End: 2022-04-01
  Administered 2022-03-31 – 2022-04-01 (×2): 20 mg via ORAL
  Filled 2022-03-31 (×2): qty 1

## 2022-03-31 MED ORDER — SODIUM CHLORIDE 0.9 % IV BOLUS
500.0000 mL | Freq: Once | INTRAVENOUS | Status: AC
  Administered 2022-03-31: 500 mL via INTRAVENOUS

## 2022-03-31 MED ORDER — INSULIN ASPART 100 UNIT/ML IJ SOLN
0.0000 [IU] | Freq: Every day | INTRAMUSCULAR | Status: DC
  Administered 2022-03-31: 4 [IU] via SUBCUTANEOUS

## 2022-03-31 NOTE — H&P (Addendum)
?History and Physical  ? ? ?Nicholas Glenn DGU:[id number] DOB: 02-23-1956 DOA: 03/30/2022 ? ?DOS: the patient was seen and examined on 03/30/2022 ? ?PCP: Jory Sims, MD  ? ?Patient coming from: Home ? ?I have personally briefly reviewed patient's old medical records in [place] ? ?CC: SOB, cough ?HPI: ?66 year old male with a history of polycystic kidney disease, status post renal transplant (now failed), type 2 diabetes, end-stage renal disease on hemodialysis via a home HD unit, paroxysmal atrial fibrillation on Eliquis, chronic systolic heart failure EF of 45%, OSA presents to the ER today with shortness of breath. ? ?Patient was out in [place] visiting his daughter last week.  He contracted COVID on his trip there.  This was 7 days ago.  He was admitted to a local hospital and spent 48 hours there.  He was discharged home.  He waited 5 days in quarantine lives in [place] and flew back to [place] on the evening of May 10. ? ?Patient dialyzes at home with a home dialysis unit.  His wife performs his hemodialysis at home.  She dialyzed him on 03/30/2022 and removed 2.6 L of fluid.  Patient started coughing violently during his dialysis treatment. ? ?Despite being on full dialysis, the patient remains on antirejection medications for his failed transplant.  He also remains on diuretics.  This is at the management of the transplant physician and nephrologist at [facility].  Patient states that he urinates about 300 cc a day. ? ?In the ER, he was noted to be hypoxic.  Room air sats were 88%.  Improved to 95% on 2 L. ? ?Patient remains on p.o. Decadron that was started while he was in [place].  He is currently on 6 mg a day. ? ?Labs White count 14.9 (on Decadron), hemoglobin 11, platelets 133 ? ?BNP of 2491 ? ?BUN of 61, creatinine 5.9 ? ?Initial lactic acid of 3.4, repeat 2.8 ? ?Chest x-ray was negative for acute cardiopulmonary disease. ? ?Due to continued hypoxia, [facility] hospitalist contacted for  admission. ?  ? ?ED Course: RA sats 88% CXR negative. ? ?Review of Systems:  ?Review of Systems  ?Constitutional:  Positive for malaise/fatigue.  ?HENT: Negative.    ?Eyes: Negative.   ?Respiratory:  Positive for cough and shortness of breath.   ?Cardiovascular: Negative.   ?Gastrointestinal: Negative.   ?Genitourinary: Negative.   ?Musculoskeletal:  Positive for myalgias.  ?Skin: Negative.   ?Neurological:  Positive for dizziness.  ?Endo/Heme/Allergies: Negative.   ?Psychiatric/Behavioral: Negative.    ?All other systems reviewed and are negative. ? ?Past Medical History:  ?Diagnosis Date  ? CHF (congestive heart failure) ([place]) 10/18/2018  ? Hospitalized at the [facility]  ? Diabetes mellitus without complication ([place])   ? GI bleed 07/23/2021  ? Hypertension   ? Melanoma ([place]) 1997, [id number], 03/2015  ? Polycystic kidney disease 06/11/2012  ? ? ?Past Surgical History:  ?Procedure Laterality Date  ? nephrectomy  05/29/2005  ? bilateral native  ? s/p kidney transplantation  08/14/2005  ? S/P livning donor kidney transplantation  ? ? ? reports that he has never smoked. He has never used smokeless tobacco. He reports that he does not drink alcohol and does not use drugs. ? ?Allergies  ?Allergen Reactions  ? Bee Venom Swelling  ? Vancomycin Other (See Comments)  ?  Flushing - resolves when infusion is slow  ? ? ?No family history on file. ? ?Prior to Admission medications   ?Medication Sig Start Date End  Date Taking? Authorizing Provider  ?HUMALOG KWIKPEN 100 UNIT/ML KwikPen Inject 0-4 Units into the skin See admin instructions. 4 units with each meals plus additional units per carb ratio 02/16/22  Yes [provider]  ?LANTUS SOLOSTAR 100 UNIT/ML Solostar Pen Inject 14 Units into the skin at bedtime. 02/15/22  Yes [provider]  ?rosuvastatin (CRESTOR) 40 MG tablet Take 20 mg by mouth at bedtime.   Yes [provider]  ?tacrolimus (PROGRAF) 1 MG capsule Take 1 mg by mouth 2 (two)  times daily.   Yes [provider]  ?acetaminophen (TYLENOL) 500 MG tablet Take 1,000 mg by mouth every 6 (six) hours as needed for mild pain.    [provider]  ?calcium acetate (PHOSLO) 667 MG capsule Take 667 mg by mouth 3 (three) times daily with meals.    [provider]  ?doxercalciferol (HECTOROL) 0.5 MCG capsule Take 0.5 mcg by mouth daily.    [provider]  ?lidocaine-prilocaine (EMLA) cream Apply 1 application topically See admin instructions. Monday,tuesday,thursday,friday with dialysis 06/20/21   [provider]  ?mupirocin ointment (BACTROBAN) 2 % Apply 1 application topically See admin instructions. After dialysis 06/20/21   [provider]  ?pantoprazole (PROTONIX) 80 mg/100 mL (0.8 mg/mL) SOLN Inject 8 mg/hr into the vein continuous. 07/23/21   Norval Morton, MD  ?predniSONE (DELTASONE) 5 MG tablet Take 5 mg by mouth daily with breakfast. Continuously    [provider]  ?tamsulosin (FLOMAX) 0.4 MG CAPS capsule Take 0.4 mg by mouth daily. 05/30/21   [provider]  ?torsemide (DEMADEX) 20 MG tablet Take 20 mg by mouth daily.    [provider]  ? ? ?Physical Exam: ?Vitals:  ? 03/31/22 0045 03/31/22 0130 03/31/22 0245 03/31/22 0315  ?BP: 106/67 101/64 112/69 120/64  ?Pulse: 85 85 73 77  ?Resp: '18 18 19 14  '$ ?Temp:      ?TempSrc:      ?SpO2: 96% 96% 96% 97%  ?Weight:      ? ? ?Physical Exam ?Vitals and nursing note reviewed.  ?Constitutional:   ?   General: He is not in acute distress. ?   Appearance: He is obese. He is not ill-appearing or toxic-appearing.  ?HENT:  ?   Head: Normocephalic and atraumatic.  ?Eyes:  ?   General: No scleral icterus. ?Cardiovascular:  ?   Rate and Rhythm: Normal rate and regular rhythm.  ?   Pulses: Normal pulses.  ?Pulmonary:  ?   Effort: Pulmonary effort is normal. No respiratory distress.  ?   Breath sounds: Normal breath sounds.  ?Abdominal:  ?   General: Bowel sounds are normal. There is  no distension.  ?   Tenderness: There is no abdominal tenderness. There is no guarding or rebound.  ?Skin: ?   General: Skin is warm and dry.  ?   Capillary Refill: Capillary refill takes less than 2 seconds.  ?Neurological:  ?   General: No focal deficit present.  ?   Mental Status: He is alert and oriented to person, place, and time.  ?  ? ?Labs on Admission: I have personally reviewed following labs and imaging studies ? ?CBC: ?Recent Labs  ?Lab 03/31/22 ?3151  ?WBC 14.9*  ?NEUTROABS 13.3*  ?HGB 11.0*  ?HCT 32.3*  ?MCV 95.6  ?PLT 133*  ? ?Basic Metabolic Panel: ?Recent Labs  ?Lab 03/31/22 ?7616  ?NA 135  ?K 3.6  ?CL 91*  ?CO2 29  ?GLUCOSE 211*  ?BUN 61*  ?  CREATININE 5.97*  ?CALCIUM 9.1  ? ?GFR: ?CrCl cannot be calculated (Unknown ideal weight.). ?Liver Function Tests: ?Recent Labs  ?Lab 03/31/22 ?7408  ?AST 22  ?ALT 21  ?ALKPHOS 73  ?BILITOT 1.4*  ?PROT 6.9  ?ALBUMIN 3.6  ? ?No results for input(s): LIPASE, AMYLASE in the last 168 hours. ?No results for input(s): AMMONIA in the last 168 hours. ?Coagulation Profile: ?No results for input(s): INR, PROTIME in the last 168 hours. ?Cardiac Enzymes: ?Recent Labs  ?Lab 03/31/22 ?1448 03/31/22 ?0205  ?TROPONINIHS 45* 43*  ? ?BNP (last 3 results) ?No results for input(s): PROBNP in the last 8760 hours. ?HbA1C: ?No results for input(s): HGBA1C in the last 72 hours. ?CBG: ?No results for input(s): GLUCAP in the last 168 hours. ?Lipid Profile: ?No results for input(s): CHOL, HDL, LDLCALC, TRIG, CHOLHDL, LDLDIRECT in the last 72 hours. ?Thyroid Function Tests: ?No results for input(s): TSH, T4TOTAL, FREET4, T3FREE, THYROIDAB in the last 72 hours. ?Anemia Panel: ?No results for input(s): VITAMINB12, FOLATE, FERRITIN, TIBC, IRON, RETICCTPCT in the last 72 hours. ?Urine analysis: ?No results found for: COLORURINE, APPEARANCEUR, [place], [place], [place], [place], [place], KETONESUR, PROTEINUR, [place], NITRITE, LEUKOCYTESUR ? ?Radiological Exams on Admission: I have  personally reviewed images ?DG Chest 1 View ? ?Result Date: 03/31/2022 ?CLINICAL DATA:  Shortness of breath and cough. EXAM: CHEST  1 VIEW COMPARISON:  March 09, 2021 FINDINGS: The heart size and mediastinal

## 2022-03-31 NOTE — Assessment & Plan Note (Addendum)
Admit to isolation medical bed.  Patient may benefit from seeing pulmonology to determine whether or not he would qualify for any COVID-19 treatments.  He is 1 week out from his initial infection but has had a relapse.  Likely due to his antirejection medications. Continue decadron. ?

## 2022-03-31 NOTE — Assessment & Plan Note (Signed)
Currently in sinus rhythm.  Remains on Eliquis. ?

## 2022-03-31 NOTE — Assessment & Plan Note (Signed)
Patient dialyzes with a home hemodialysis unit.  He dialyzes through an AV fistula.  His wife is trained to use the home dialysis unit and cannulates his fistula on 4-times a week schedule.  Last dialysis was on 03/30/2022.  2.6 L of fluid removed.  Will need dialysis during his hospitalization.  Please consult nephrology. ?

## 2022-03-31 NOTE — Assessment & Plan Note (Signed)
Continue supplemental oxygen.  Hypoxia due to COVID. ?

## 2022-03-31 NOTE — Assessment & Plan Note (Signed)
Uses 70/30 insulin along with sliding scale. ?

## 2022-03-31 NOTE — TOC Progression Note (Signed)
Transition of Care (TOC) - Initial/Assessment Note  ? ? ?Patient Details  ?Name: Nicholas Glenn ?MRN: [id number] ?Date of Birth: 1955-12-10 ? ?Transition of Care (TOC) CM/SW Contact:    ?Paulene Floor Khadeeja Elden, LCSWA ?Phone Number: ?03/31/2022, 1:58 PM ? ?Clinical Narrative:                 ?TOC following patient for any d/c planning needs once medically stable. ? ? ? ?Patient Goals and CMS Choice ?  ?  ?  ? ?Expected Discharge Plan and Services ?  ?  ?  ?  ?  ?                ?  ?  ?  ?  ?  ?  ?  ?  ?  ?  ? ?Prior Living Arrangements/Services ?  ?  ?  ?       ?  ?  ?  ?  ? ?Activities of Daily Living ?Home Assistive Devices/Equipment: CPAP ?ADL Screening (condition at time of admission) ?Patient's cognitive ability adequate to safely complete daily activities?: Yes ?Is the patient deaf or have difficulty hearing?: No ?Does the patient have difficulty seeing, even when wearing glasses/contacts?: No ?Does the patient have difficulty concentrating, remembering, or making decisions?: No ?Patient able to express need for assistance with ADLs?: Yes ?Does the patient have difficulty dressing or bathing?: No ?Independently performs ADLs?: Yes (appropriate for developmental age) ?Does the patient have difficulty walking or climbing stairs?: No ?Weakness of Legs: None ?Weakness of Arms/Hands: None ? ?Permission Sought/Granted ?  ?  ?   ?   ?   ?   ? ?Emotional Assessment ?  ?  ?  ?  ?  ?  ? ?Admission diagnosis:  Acute respiratory failure due to COVID-19 ([place]) [U07.1, J96.00] ?COVID-19 virus infection [U07.1] ?Patient Active Problem List  ? Diagnosis Date Noted  ? COVID-19 virus infection 03/31/2022  ? Acute hypoxemic respiratory failure due to COVID-19 [facility]) 03/31/2022  ? Kidney transplant failure 03/31/2022  ? ESRD on hemodialysis [facility]) - uses home HD unit 07/23/2021  ? PAF (paroxysmal atrial fibrillation) ([place]) 07/23/2021  ? Chronic anticoagulation 07/23/2021  ? Diabetes mellitus type 2, controlled ([place]) 07/23/2021  ? Heart failure  with reduced ejection fraction (H[facility]) - LVEF 45% at [facility] in 9/22 07/23/2021  ? OSA on CPAP 07/23/2021  ? Anemia in chronic kidney disease 03/09/2021  ? Immunosuppressed status ([place]) 05/22/2013  ? 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 History of renal transplant 07/26/2006  ? ?PCP:  Jory Sims, MD ?Pharmacy:   ?[facility], [place] ?[address] ?[place] [address] ?Phone: [phone] Fax: [phone] ? ?[id number] [address], [address] AT [facility] [address] ?[place] ?[place] [address] ?Phone: [phone] Fax: [phone] ? ? ? ? ?Social Determinants of Health (SDOH) Interventions ?  ? ?Readmission Risk Interventions ?   ? View : No data to display.  ?  ?  ?  ? ? ? ?

## 2022-03-31 NOTE — Progress Notes (Signed)
Left lower extremity venous duplex has been completed. ?Preliminary results can be found in CV Proc through chart review.  ? ?03/31/22 3:27 PM ?Carlos Levering RVT   ?

## 2022-03-31 NOTE — Assessment & Plan Note (Signed)
Remains on steroids and Prograf. ?

## 2022-03-31 NOTE — Assessment & Plan Note (Signed)
-   Continue Eliquis 

## 2022-03-31 NOTE — ED Notes (Signed)
Called inpatient floor to request initiation of care handoff procedure ?

## 2022-03-31 NOTE — Assessment & Plan Note (Signed)
Despite a failed transplant, patient still urinates some what.  Continue his Demadex 20 mg a day. ?

## 2022-03-31 NOTE — Consult Note (Signed)
[facility] ?Renal Consultation Note  ?  ?Indication for Consultation:  Management of ESRD/hemodialysis, anemia, hypertension/volume, and secondary hyperparathyroidism. ?PCP: ? ?HPI: Nicholas Glenn is a 65 y.o. 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 He is being admitted with worsened dyspnea/COVID. ? ?Diagnosed with COVID on 5/5 while visiting his daughter in [place]. Initial symptoms were weakness, cough, headache, mild dyspnea. Went to ED, ASA was added to his home Eliquis and steroids started. He flew home on 5/10 after 5d quarantine. Initially, he felt ok. On 5/11, developed worsened dyspnea and productive cough which prompted ED evaluation. There, found to be hypoxic with O2 sat 88%. Labs showed Na 135, K 3.6, CO2 29, BNP 2491, LA 3.4 -> 2.8, WBC 14.9, Hgb 11, Plts 133. CXR clear. ? ?This morning, he is comfortable with O2. Denies N/V/D. Has noted increased bruising v. small petechial spots on B legs since starting ASA last week. Has bandage on L thigh d/t where he had nodule excised at [facility] - currently being packed at home. ? ?Dialyzes at home with NxStage machine on MTuThF schedule. This week, he dialyzed on Monday and Thursday due to travel schedule. Wife cannulates his LUE AVF with buttonholes, no recent issues. Met EDW with last HD, but she has noticed increased edema in LLE. ? ? ?Past Medical History:  ?Diagnosis Date  ? CHF (congestive heart failure) (HCC) 10/18/2018  ? Hospitalized at the [facility] [place]  ? Diabetes mellitus without complication (H[facility])   ? GI bleed 07/23/2021  ? Hypertension   ? Melanoma ([facility]) 1997, 2013,2014, 03/2015  ? Polycystic kidney disease 06/11/2012  ? ?Past Surgical History:  ?Procedure Laterality Date  ? nephrectomy   05/29/2005  ? bilateral native  ? s/p kidney transplantation  08/14/2005  ? S/P livning donor kidney transplantation  ? ?History reviewed. No pertinent family history. ?Social History: ? reports that he has never smoked. He has never used smokeless tobacco. He reports that he does not drink alcohol and does not use drugs. ? ?ROS: As per HPI otherwise negative. ? ?Physical Exam: ?Vitals:  ? 03/31/22 0815 03/31/22 0830 03/31/22 0841 03/31/22 1125  ?BP: 128/79 113/70  127/66  ?Pulse: 80 75  78  ?Resp:  18  18  ?Temp:   98.8 ?F (37.1 ?C) 98.3 ?F (36.8 ?C)  ?TempSrc:   Oral Oral  ?SpO2: 95% 97%  95%  ?Weight:    84.2 kg  ?Height:    5' 9" (1.753 m)  ?   ?General: Well developed, well nourished, in no acute distress. Nasal O2 in place. ?Head: Normocephalic, atraumatic, sclera non-icteric, mucus membranes are moist. ?Neck: Supple without lymphadenopathy/masses. JVD not elevated. ?Lungs: Clear bilaterally to auscultation without wheezes, rales, or rhonchi.  ?Heart: RRR with normal S1, S2. No murmurs, rubs, or gallops appreciated. ?Abdomen: Soft, non-tender, non-distended with normoactive bowel sounds. Musculoskeletal:  Strength and tone appear normal for age. ?Lower extremities: No RLE edema, LLE with 1+ edema. Scattered small petechiae and bruises. ?Neuro: Alert and oriented X 3. Moves all extremities spontaneously. ?Psych:  Responds to questions appropriately with a normal affect. ?Dialysis Access: L AVF + bruit ? ?Allergies  ?Allergen Reactions  ? Bee Venom Swelling  ? Vancomycin Other (See Comments)  ?  Flushing - resolves when infusion is slow  ? ?  Prior to Admission medications   ?Medication Sig Start Date End Date Taking? Authorizing Provider  ?acetaminophen (TYLENOL) 500 MG tablet Take 1,000 mg by mouth every 6 (six) hours as needed for mild pain.   Yes [provider]  ?apixaban (ELIQUIS) 5 MG TABS tablet Take 5 mg by mouth 2 (two) times daily.   Yes [provider]  ?aspirin EC 81 MG tablet Take  81 mg by mouth daily. Swallow whole.   Yes [provider]  ?calcium acetate (PHOSLO) 667 MG capsule Take 1,334 mg by mouth 3 (three) times daily with meals.   Yes [provider]  ?carvedilol (COREG) 12.5 MG tablet Take 12.5 mg by mouth 2 (two) times daily with a meal.   Yes [provider]  ?dexamethasone (DECADRON) 6 MG tablet Take 6 mg by mouth See admin instructions. Qd x 10 days   Yes [provider]  ?doxercalciferol (HECTOROL) 0.5 MCG capsule Take 0.5 mcg by mouth every Monday, Wednesday, and Friday.   Yes [provider]  ?ENTRESTO 49-51 MG Take 0.5 tablets by mouth 2 (two) times daily. 10/28/21  Yes [provider]  ?HUMALOG KWIKPEN 100 UNIT/ML KwikPen Inject 0-4 Units into the skin See admin instructions. 4 units with each meals plus additional units per carb ratio 02/16/22  Yes [provider]  ?LANTUS SOLOSTAR 100 UNIT/ML Solostar Pen Inject 14 Units into the skin at bedtime. 02/15/22  Yes [provider]  ?lidocaine-prilocaine (EMLA) cream Apply 1 application topically See admin instructions. Monday,tuesday,thursday,friday with dialysis 06/20/21  Yes [provider]  ?mupirocin ointment (BACTROBAN) 2 % Apply 1 application. topically See admin instructions. After dialysis Monday, Tuesday,Thursday,friday 06/20/21  Yes [provider]  ?NON FORMULARY CPAP at bedtime   Yes [provider]  ?rosuvastatin (CRESTOR) 40 MG tablet Take 20 mg by mouth at bedtime.   Yes [provider]  ?tacrolimus (PROGRAF) 1 MG capsule Take 1 mg by mouth 2 (two) times daily.   Yes [provider]  ?tamsulosin (FLOMAX) 0.4 MG CAPS capsule Take 0.4 mg by mouth daily. 05/30/21  Yes [provider]  ?torsemide (DEMADEX) 20 MG tablet Take 20 mg by mouth daily.   Yes [provider]  ?pantoprazole (PROTONIX) 80 mg/100 mL (0.8 mg/mL) SOLN Inject 8 mg/hr into the vein continuous. ?Patient not taking: Reported  on 03/31/2022 07/23/21   Norval Morton, MD  ?predniSONE (DELTASONE) 5 MG tablet Take 5 mg by mouth daily with breakfast. Continuously    [provider]  ? ?Current Facility-Administered Medications  ?Medication Dose Route Frequency Provider Last Rate Last Admin  ? acetaminophen (TYLENOL) tablet 650 mg  650 mg Oral Q6H PRN Kristopher Oppenheim, DO      ? Or  ? acetaminophen (TYLENOL) suppository 650 mg  650 mg Rectal Q6H PRN Kristopher Oppenheim, DO      ? albuterol (PROVENTIL) (2.5 MG/3ML) 0.083% nebulizer solution 2.5 mg  2.5 mg Nebulization Q2H PRN Kristopher Oppenheim, DO      ? apixaban Arne [place]) tablet 5 mg  5 mg Oral BID Vann, Jessica U, DO      ? calcium acetate (PHOSLO) capsule 1,334 mg  1,334 mg Oral TID WC Vann, Jessica U, DO      ? carvedilol (COREG) tablet 12.5 mg  12.5 mg Oral BID WC Vann, Jessica U, DO      ? dexamethasone (DECADRON) tablet 6 mg  6 mg Oral Daily Kristopher Oppenheim, DO      ? insulin  aspart (novoLOG) injection 0-15 Units  0-15 Units Subcutaneous TID WC Kristopher Oppenheim, DO   5 Units at 03/31/22 7915  ? insulin aspart (novoLOG) injection 0-5 Units  0-5 Units Subcutaneous QHS Kristopher Oppenheim, DO      ? insulin glargine-yfgn [facility]) injection 14 Units  14 Units Subcutaneous QHS Vann, Jessica U, DO      ? rosuvastatin (CRESTOR) tablet 20 mg  20 mg Oral QHS Vann, Jessica U, DO      ? sacubitril-valsartan (ENTRESTO) 49-51 mg per tablet  0.5 tablet Oral BID Vann, Jessica U, DO      ? tacrolimus (PROGRAF) capsule 1 mg  1 mg Oral BID Vann, Jessica U, DO      ? tamsulosin (FLOMAX) capsule 0.4 mg  0.4 mg Oral Daily Vann, Jessica U, DO      ? torsemide (DEMADEX) tablet 20 mg  20 mg Oral Daily Eulogio Bear U, DO      ? ?Labs: ?Basic Metabolic Panel: ?Recent Labs  ?Lab 03/31/22 ?0569  ?NA 135  ?K 3.6  ?CL 91*  ?CO2 29  ?GLUCOSE 211*  ?BUN 61*  ?CREATININE 5.97*  ?CALCIUM 9.1  ? ?Liver Function Tests: ?Recent Labs  ?Lab 03/31/22 ?7948  ?AST 22  ?ALT 21  ?ALKPHOS 73  ?BILITOT 1.4*  ?PROT 6.9  ?ALBUMIN 3.6  ? ?CBC: ?Recent Labs  ?Lab  03/31/22 ?0165  ?WBC 14.9*  ?NEUTROABS 13.3*  ?HGB 11.0*  ?HCT 32.3*  ?MCV 95.6  ?PLT 133*  ? ?CBG: ?Recent Labs  ?Lab 03/31/22 ?0731 03/31/22 ?1122  ?GLUCAP 215* 289*  ? ?Studies/Results: ?DG Chest 1 View ? ?Resu

## 2022-03-31 NOTE — ED Notes (Signed)
ED TO INPATIENT HANDOFF REPORT ? ?ED Nurse Name and Phone #: Raquel Sarna RN [phone] ? ?S ?Name/Age/Gender ?Nicholas Glenn ?66 y.o. ?male ?Room/Bed: 005C/005C ? ?Code Status ?  Code Status: Full Code ? ?Home/SNF/Other ?Home ?Patient oriented to: self, place, time, and situation ?Is this baseline? Yes  ? ?Triage Complete: Triage complete  ?Chief Complaint ?COVID-19 virus infection [U07.1] ? ?Triage Note ?Pt presents for SOB since last Thursday while in [place]. Was treated there in ER and tested positive for covid at that time. Received decadron and has started taking ASA.  ? ?SOB has since worsened and cough worsened after dialysis. When he presented to ER, sats 88% on RA, improved to 95% on 2L. ? ? ?H/o failing kidney transplant, CHF, DM, HD at home M T TH F (last tr this afternoon).  ? ?Allergies ?Allergies  ?Allergen Reactions  ? Bee Venom Swelling  ? Vancomycin Other (See Comments)  ?  Flushing - resolves when infusion is slow  ? ? ?Level of Care/Admitting Diagnosis ?ED Disposition   ? ? ED Disposition  ?Admit  ? Condition  ?--  ? Comment  ?Hospital Area: [facility] [[id number]] ? Level of Care: Med-Surg [16] ? May admit patient to [facility] or [facility] if equivalent level of care is available:: No ? Covid Evaluation: Confirmed COVID Positive ? Diagnosis: COVID-19 virus infection [[id number]] ? Admitting Physician: Bridgett Larsson, [place] ? Attending Physician: Bridgett Larsson, [place] ? Estimated length of stay: 3 - 4 days ? Certification:: I certify this patient will need inpatient services for at least 2 midnights ?  ?  ? ?  ? ? ?B ?Medical/Surgery History ?Past Medical History:  ?Diagnosis Date  ? CHF (congestive heart failure) ([place]) 10/18/2018  ? Hospitalized at the [place]  ? Diabetes mellitus without complication ([place])   ? GI bleed 07/23/2021  ? Hypertension   ? Melanoma ([place]) 1997, [id number], 03/2015  ? Polycystic kidney disease 06/11/2012  ? ?Past Surgical History:  ?Procedure Laterality Date   ? nephrectomy  05/29/2005  ? bilateral native  ? s/p kidney transplantation  08/14/2005  ? S/P livning donor kidney transplantation  ?  ? ?A ?IV Location/Drains/Wounds ?Patient Lines/Drains/Airways Status   ? ? Active Line/Drains/Airways   ? ? Name Placement date Placement time Site Days  ? Peripheral IV 03/31/22 20 G Right Antecubital 03/31/22  0200  Antecubital  less than 1  ? Fistula / Graft Left Upper arm Arteriovenous fistula 07/23/21  0437  Upper arm  251  ? Hemodialysis Catheter Right Subclavian Double lumen Temporary (Non-Tunneled) --  --  Subclavian  --  ? ?  ?  ? ?  ? ? ?Intake/Output Last 24 hours ?No intake or output data in the 24 hours ending 03/31/22 0841 ? ?Labs/Imaging ?Results for orders placed or performed during the hospital encounter of 03/30/22 (from the past 48 hour(s))  ?Culture, blood (routine x 2)     Status: None (Preliminary result)  ? Collection Time: 03/31/22 12:15 AM  ? Specimen: BLOOD RIGHT ARM  ?Result Value Ref Range  ? Specimen Description BLOOD RIGHT ARM   ? Special Requests    ?  BOTTLES DRAWN AEROBIC AND ANAEROBIC Blood Culture adequate volume  ? Culture    ?  NO GROWTH < 12 HOURS ?Performed at [facility], [place] [address] [place], [place] [address] ?  ? Report Status PENDING   ?Comprehensive metabolic panel     Status: Abnormal  ? Collection Time:  03/31/22 12:33 AM  ?Result Value Ref Range  ? Sodium 135 135 - 145 mmol/L  ? Potassium 3.6 3.5 - 5.1 mmol/L  ? Chloride 91 (L) 98 - 111 mmol/L  ? CO2 29 22 - 32 mmol/L  ? Glucose, Bld 211 (H) 70 - 99 mg/dL  ?  Comment: Glucose reference range applies only to samples taken after fasting for at least 8 hours.  ? BUN 61 (H) 8 - 23 mg/dL  ? Creatinine, Ser 5.97 (H) 0.61 - 1.24 mg/dL  ? Calcium 9.1 8.9 - 10.3 mg/dL  ? Total Protein 6.9 6.5 - 8.1 g/dL  ? Albumin 3.6 3.5 - 5.0 g/dL  ? AST 22 15 - 41 U/L  ? ALT 21 0 - 44 U/L  ? Alkaline Phosphatase 73 38 - 126 U/L  ? Total Bilirubin 1.4 (H) 0.3 - 1.2 mg/dL  ? GFR, Estimated 10 (L) >60  mL/min  ?  Comment: (NOTE) ?Calculated using the CKD-EPI Creatinine Equation (2021) ?  ? Anion gap 15 5 - 15  ?  Comment: Performed at [facility], [place] [address] [place], [place] [address]  ?CBC with Differential     Status: Abnormal  ? Collection Time: 03/31/22 12:33 AM  ?Result Value Ref Range  ? WBC 14.9 (H) 4.0 - 10.5 K/uL  ? RBC 3.38 (L) 4.22 - 5.81 MIL/uL  ? Hemoglobin 11.0 (L) 13.0 - 17.0 g/dL  ? HCT 32.3 (L) 39.0 - 52.0 %  ? MCV 95.6 80.0 - 100.0 fL  ? MCH 32.5 26.0 - 34.0 pg  ? MCHC 34.1 30.0 - 36.0 g/dL  ? RDW 20.2 (H) 11.5 - 15.5 %  ? Platelets 133 (L) 150 - 400 K/uL  ? nRBC 0.3 (H) 0.0 - 0.2 %  ? Neutrophils Relative % 89 %  ? Neutro Abs 13.3 (H) 1.7 - 7.7 K/uL  ? Lymphocytes Relative 6 %  ? Lymphs Abs 0.9 0.7 - 4.0 K/uL  ? Monocytes Relative 4 %  ? Monocytes Absolute 0.6 0.1 - 1.0 K/uL  ? Eosinophils Relative 0 %  ? Eosinophils Absolute 0.0 0.0 - 0.5 K/uL  ? Basophils Relative 0 %  ? Basophils Absolute 0.0 0.0 - 0.1 K/uL  ? Immature Granulocytes 1 %  ? Abs Immature Granulocytes 0.18 (H) 0.00 - 0.07 K/uL  ?  Comment: Performed at [facility], [place] [address], [place], [place] [address]  ?Troponin I (High Sensitivity)     Status: Abnormal  ? Collection Time: 03/31/22 12:33 AM  ?Result Value Ref Range  ? Troponin I (High Sensitivity) 45 (H) <18 ng/L  ?  Comment: (NOTE) ?Elevated high sensitivity troponin I (hsTnI) values and significant  ?changes across serial measurements may suggest ACS but many other  ?chronic and acute conditions are known to elevate hsTnI results.  ?Refer to the "Links" section for chest pain algorithms and additional  ?guidance. ?Performed at [facility], [place] [address] [place], [place] ?[address] ?  ?Brain natriuretic peptide     Status: Abnormal  ? Collection Time: 03/31/22 12:33 AM  ?Result Value Ref Range  ? B Natriuretic Peptide 2,491.4 (H) 0.0 - 100.0 pg/mL  ?  Comment: Performed at [facility], [place] [address], [place],  [address]  ?Culture,  blood (routine x 2)     Status: None (Preliminary result)  ? Collection Time: 03/31/22 12:33 AM  ? Specimen: BLOOD RIGHT HAND  ?Result Value Ref Range  ? Specimen Description  BLOOD RIGHT HAND   ? Special Requests    ?  BOTTLES DRAWN AEROBIC ONLY Blood Culture results may not be optimal due to an excessive volume of blood received in culture bottles  ? Culture    ?  NO GROWTH < 12 HOURS ?Performed at [facility], [place] [address] [place], [place] [address] ?  ? Report Status PENDING   ?Lactic acid, plasma     Status: Abnormal  ? Collection Time: 03/31/22 12:33 AM  ?Result Value Ref Range  ? Lactic Acid, Venous 3.4 (HH) 0.5 - 1.9 mmol/L  ?  Comment: CRITICAL RESULT CALLED TO, READ BACK BY AND VERIFIED WITH: ?GRACE TATE RN 03/31/22 [place] ?Performed at [facility], [place] [address], [place], [place] [address] ?  ?Lactic acid, plasma     Status: Abnormal  ? Collection Time: 03/31/22  2:05 AM  ?Result Value Ref Range  ? Lactic Acid, Venous 2.8 (HH) 0.5 - 1.9 mmol/L  ?  Comment: CRITICAL VALUE NOTED.  VALUE IS CONSISTENT WITH PREVIOUSLY REPORTED AND CALLED VALUE. ?Performed at [facility], [place] [address], [place], [place] [address] ?  ?Troponin I (High Sensitivity)     Status: Abnormal  ? Collection Time: 03/31/22  2:05 AM  ?Result Value Ref Range  ? Troponin I (High Sensitivity) 43 (H) <18 ng/L  ?  Comment: (NOTE) ?Elevated high sensitivity troponin I (hsTnI) values and significant  ?changes across serial measurements may suggest ACS but many other  ?chronic and acute conditions are known to elevate hsTnI results.  ?Refer to the "Links" section for chest pain algorithms and additional  ?guidance. ?Performed at [facility], [place] [address] [place], [place] ?[address] ?  ?CBG monitoring, ED     Status: Abnormal  ? Collection Time: 03/31/22  7:31 AM  ?Result Value Ref Range  ? Glucose-Capillary 215 (H) 70 - 99 mg/dL  ?  Comment: Glucose reference range applies only to samples taken after  fasting for at least 8 hours.  ? ?DG Chest 1 View ? ?Result Date: 03/31/2022 ?CLINICAL DATA:  Shortness of breath and cough. EXAM: CHEST  1 VIEW COMPARISON:  March 09, 2021 FINDINGS: The heart size a

## 2022-03-31 NOTE — Assessment & Plan Note (Signed)
Patient still makes a small amount of urine.  He remains on antirejection medications per his [facility] transplant physician. ?

## 2022-03-31 NOTE — Plan of Care (Signed)
  Problem: Health Behavior/Discharge Planning: Goal: Ability to manage health-related needs will improve Outcome: Progressing   Problem: Clinical Measurements: Goal: Ability to maintain clinical measurements within normal limits will improve Outcome: Progressing   Problem: Nutrition: Goal: Adequate nutrition will be maintained Outcome: Progressing   

## 2022-03-31 NOTE — Subjective & Objective (Signed)
CC: SOB, cough ?HPI: ?66 year old male with a history of polycystic kidney disease, status post renal transplant (now failed), type 2 diabetes, end-stage renal disease on hemodialysis via a home HD unit, paroxysmal atrial fibrillation on Eliquis, chronic systolic heart failure EF of 45%, OSA presents to the ER today with shortness of breath. ? ?Patient was out in [place] visiting his daughter last week.  He contracted COVID on his trip there.  This was 7 days ago.  He was admitted to a local hospital and spent 48 hours there.  He was discharged home.  He waited 5 days in quarantine lives in [place] and flew back to [place] on the evening of May 10. ? ?Patient dialyzes at home with a home dialysis unit.  His wife performs his hemodialysis at home.  She dialyzed him on 03/30/2022 and removed 2.6 L of fluid.  Patient started coughing violently during his dialysis treatment. ? ?Despite being on full dialysis, the patient remains on antirejection medications for his failed transplant.  He also remains on diuretics.  This is at the management of the transplant physician and nephrologist at [facility].  Patient states that he urinates about 300 cc a day. ? ?In the ER, he was noted to be hypoxic.  Room air sats were 88%.  Improved to 95% on 2 L. ? ?Patient remains on p.o. Decadron that was started while he was in [place].  He is currently on 6 mg a day. ? ?Labs White count 14.9 (on Decadron), hemoglobin 11, platelets 133 ? ?BNP of 2491 ? ?BUN of 61, creatinine 5.9 ? ?Initial lactic acid of 3.4, repeat 2.8 ? ?Chest x-ray was negative for acute cardiopulmonary disease. ? ?Due to continued hypoxia, [facility] hospitalist contacted for admission. ? ?

## 2022-03-31 NOTE — Progress Notes (Signed)
Patient admitted after midnight, please see H&P.  Here with SOB.  Recent COVID infection on 5/5 in [place].  Flew back to [place] on 5/10.   ? ?Diminished lung sounds on right. Left leg swelling (has happened in the past) ?Differential includes: worsening HF (echo ordered- last echo at [facility] was 9/22; BNP elevated but also ESRD-- does have orthopnea), PE (r/o DVT with LE duplex- less likely as on eliquis at home), vs continued COVID infection- lungs unremarkable on x ray. ? ?-renal Dr. Johnney Ou consulted ? ?Eulogio Bear DO ? ?

## 2022-03-31 NOTE — ED Provider Triage Note (Signed)
Emergency Medicine Provider Triage Evaluation Note ? ?Nicholas Glenn , a 66 y.o. male  was evaluated in triage.  Pt complains of shortness of breath and cough.  Hypoxic to 88% on room air on arrival, improved on 2 L nasal cannula.  Was recently in [place] and evaluated in the ED and found to have COVID was admitted for 1 night but then improved, has been on Decadron at home and was doing okay but today during home dialysis treatment had significant worsening in shortness of breath and cough.  Took off about 2.7 L during home dialysis and patient is at his dry weight.  He denies associated chest pain.  Is on Eliquis and reports compliance. ? ?Review of Systems  ?Positive: Shortness of breath, cough ?Negative: Fever, chest pain, abdominal pain ? ?Physical Exam  ?BP 116/66   Pulse 85   Temp 98.5 ?F (36.9 ?C) (Oral)   Resp (!) 22   Wt 84 kg   SpO2 95%   BMI 27.35 kg/m?  ?Gen:   Awake, ill-appearing ?Resp:  On 2 L nasal cannula, tachypneic with increased respiratory effort, rales and rhonchi noted bilaterally and frequent coughing during exam ?MSK:   Moves extremities without difficulty  ?Other:   ? ?Medical Decision Making  ?Medically screening exam initiated at 12:18 AM.  Appropriate orders placed.  Dennard Vezina was informed that the remainder of the evaluation will be completed by another provider, this initial triage assessment does not replace that evaluation, and the importance of remaining in the ED until their evaluation is complete. ? ? ?  ?Jacqlyn Larsen, PA-C ?03/31/22 0023 ? ?

## 2022-03-31 NOTE — Assessment & Plan Note (Signed)
Continue CPAP.  

## 2022-03-31 NOTE — ED Triage Notes (Signed)
Pt presents for SOB since last Thursday while in [place]. Was treated there in ER and tested positive for covid at that time. Received decadron and has started taking ASA.  ? ?SOB has since worsened and cough worsened after dialysis. When he presented to ER, sats 88% on RA, improved to 95% on 2L. ? ? ?H/o failing kidney transplant, CHF, DM, HD at home M T TH F (last tr this afternoon). ?

## 2022-04-01 ENCOUNTER — Other Ambulatory Visit (HOSPITAL_COMMUNITY): Payer: TRICARE For Life (TFL)

## 2022-04-01 DIAGNOSIS — U071 COVID-19: Secondary | ICD-10-CM | POA: Diagnosis not present

## 2022-04-01 DIAGNOSIS — J9601 Acute respiratory failure with hypoxia: Secondary | ICD-10-CM | POA: Diagnosis not present

## 2022-04-01 LAB — RENAL FUNCTION PANEL
Albumin: 3.2 g/dL — ABNORMAL LOW (ref 3.5–5.0)
Anion gap: 12 (ref 5–15)
BUN: 40 mg/dL — ABNORMAL HIGH (ref 8–23)
CO2: 27 mmol/L (ref 22–32)
Calcium: 8.2 mg/dL — ABNORMAL LOW (ref 8.9–10.3)
Chloride: 96 mmol/L — ABNORMAL LOW (ref 98–111)
Creatinine, Ser: 4.59 mg/dL — ABNORMAL HIGH (ref 0.61–1.24)
GFR, Estimated: 14 mL/min — ABNORMAL LOW (ref >60)
Glucose, Bld: 200 mg/dL — ABNORMAL HIGH (ref 70–99)
Phosphorus: 2.5 mg/dL (ref 2.5–4.6)
Potassium: 3.7 mmol/L (ref 3.5–5.1)
Sodium: 135 mmol/L (ref 135–145)

## 2022-04-01 LAB — CBC
HCT: 30.9 % — ABNORMAL LOW (ref 39.0–52.0)
Hemoglobin: 10.6 g/dL — ABNORMAL LOW (ref 13.0–17.0)
MCH: 31.9 pg (ref 26.0–34.0)
MCHC: 34.3 g/dL (ref 30.0–36.0)
MCV: 93.1 fL (ref 80.0–100.0)
Platelets: 120 10*3/uL — ABNORMAL LOW (ref 150–400)
RBC: 3.32 MIL/uL — ABNORMAL LOW (ref 4.22–5.81)
RDW: 19.8 % — ABNORMAL HIGH (ref 11.5–15.5)
WBC: 12.7 10*3/uL — ABNORMAL HIGH (ref 4.0–10.5)
nRBC: 0 % (ref 0.0–0.2)

## 2022-04-01 LAB — GLUCOSE, CAPILLARY
Glucose-Capillary: 148 mg/dL — ABNORMAL HIGH (ref 70–99)
Glucose-Capillary: 177 mg/dL — ABNORMAL HIGH (ref 70–99)

## 2022-04-01 MED ORDER — ALTEPLASE 2 MG IJ SOLR: 2.0000 mg | Freq: Once | INTRAMUSCULAR | Status: DC | PRN

## 2022-04-01 MED ORDER — SODIUM CHLORIDE 0.9 % IV SOLN: 100.0000 mL | INTRAVENOUS | Status: DC | PRN

## 2022-04-01 MED ORDER — LIDOCAINE-PRILOCAINE 2.5-2.5 % EX CREA: 1.0000 "application " | TOPICAL_CREAM | CUTANEOUS | Status: DC | PRN

## 2022-04-01 MED ORDER — HEPARIN SODIUM (PORCINE) 1000 UNIT/ML DIALYSIS: 1000.0000 [IU] | INTRAMUSCULAR | Status: DC | PRN

## 2022-04-01 MED ORDER — LIDOCAINE HCL (PF) 1 % IJ SOLN: 5.0000 mL | INTRAMUSCULAR | Status: DC | PRN

## 2022-04-01 MED ORDER — PENTAFLUOROPROP-TETRAFLUOROETH EX AERO: 1.0000 "application " | INHALATION_SPRAY | CUTANEOUS | Status: DC | PRN

## 2022-04-01 NOTE — Care Management CC44 (Signed)
Condition Code 44 Documentation Completed ? ?Patient Details  ?Name: Nicholas Glenn ?MRN: [id number] ?Date of Birth: February 26, 1956 ? ? ?Condition Code 44 given:  Yes ?Patient signature on Condition Code 44 notice:  Yes ?Documentation of 2 MD's agreement:  Yes ?Code 44 added to claim:  Yes ? ? ? ?Bartholomew Crews, RN ?04/01/2022, 1:19 PM ? ?

## 2022-04-01 NOTE — TOC Transition Note (Signed)
Transition of Care (TOC) - CM/SW Discharge Note ? ? ?Patient Details  ?Name: Nicholas Glenn ?MRN: [id number] ?Date of Birth: [birth year] ? ?Transition of Care (TOC) CM/SW Contact:  ?Bartholomew Crews, RN ?Phone Number: [phone] ?04/01/2022, 1:23 PM ? ? ?Clinical Narrative:    ? ?Spoke with patient and spouse at the bedside to discuss post acute transition. Patient not meeting Medicare guidelines for home oxygen. Spouse concerned about previous low oxygen saturation and is wanting to discuss paying out of pocket cost for home oxygen. Agreeable to referral to [employer] to discuss out of pocket costs. No further TOC needs identified at this time.  ? ?Final next level of care: Home/Self Care ?Barriers to Discharge: No Barriers Identified ? ? ?Patient Goals and CMS Choice ?Patient states their goals for this hospitalization and ongoing recovery are:: return home with wife ?CMS Medicare.gov Compare Post Acute Care list provided to:: Patient ?Choice offered to / list presented to : Patient, Spouse ? ?Discharge Placement ?  ?           ?  ?  ?  ?  ? ?Discharge Plan and Services ?  ?  ?           ?DME Arranged: Oxygen ?DME Agency: [facility] ?Date DME Agency Contacted: 04/01/22 ?Time DME Agency Contacted: [phone] ?Representative spoke with at DME Agency: Delana Meyer ?HH Arranged: NA ?[place] Agency: NA ?  ?  ?  ? ?Social Determinants of Health (SDOH) Interventions ?  ? ? ?Readmission Risk Interventions ?   ? View : No data to display.  ?  ?  ?  ? ? ? ? ? ?

## 2022-04-01 NOTE — Progress Notes (Signed)
SATURATION QUALIFICATIONS: (This note is used to comply with regulatory documentation for home oxygen) ? ?Patient Saturations on Room Air at Rest = 94% ? ?Patient Saturations on Room Air while Ambulating = 96% ? ?Patient Saturations on N/A Liters of oxygen while Ambulating = n/a% ? ?Please briefly explain why patient needs home oxygen: ?

## 2022-04-01 NOTE — Progress Notes (Signed)
DISCHARGE NOTE HOME ?Javoni Lucken to be discharged Home per MD order. Discussed prescriptions and follow up appointments with the patient. Prescriptions given to patient; medication list explained in detail. Patient verbalized understanding. ? ?Skin clean, dry and intact without evidence of skin break down, no evidence of skin tears noted. IV catheter discontinued intact. Site without signs and symptoms of complications. Dressing and pressure applied. Pt denies pain at the site currently. No complaints noted. ? ?Patient free of lines, drains, and wounds.  ? ?An After Visit Summary (AVS) was printed and given to the patient. ?Patient escorted via wheelchair, and discharged home via private auto. ? ?Rogelia Mire, RN  ?

## 2022-04-01 NOTE — Progress Notes (Signed)
Successfully wean off from oxygen ,able to maintain 88-90 r.a. saturation while sitting down,no complaint.Incentive spirometer given ,nurse explained the purpose and how to use.Will do ambulatory saturation later on. ?

## 2022-04-01 NOTE — Discharge Summary (Signed)
?Physician Discharge Summary  ?Nicholas Glenn SJG:[id number] DOB: 1955/11/27 DOA: 03/30/2022 ? ?PCP: Jory [place], MD ? ?Admit date: 03/30/2022 ?Discharge date: 04/01/2022 ? ?Admitted From: Home ?Disposition: Home ? ?Recommendations for Outpatient Follow-up:  ?Follow up with PCP in 1-2 weeks ?Follow-up with nephrology as scheduled ? ?Home Health: None ?Equipment/Devices: None ? ?Discharge Condition: Stable ?CODE STATUS: Full code ?Diet recommendation: Renal diet ? ?History of present illness: ? ?Nicholas Glenn is a 66 year old male with past medical history significant for history of polycystic kidney disease/ESRD s/p failed renal transplant on home HD, type 2 diabetes mellitus, chronic systolic congestive heart failure with LVEF 45%, OSA on nocturnal CPAP who presented to [facility] ED on 5/12 with shortness of breath.  Recently in [place] visiting his daughter week prior in which he contracted COVID-19 viral infection on his trip roughly 7 days prior.  He was admitted to a local hospital and spent 48 hours there and was discharged home.  He quarantined for 5 days in [place] followed by flight back to [place] on the evening of May 10.  Patient continues with treatment with Decadron that was prescribed in [place]. ? ?Patient dialyzes at home with a home dialysis unit, which his wife performs the dialysis for him.  We will last dialyzed on 03/30/2022 with removal of 2.6 L of fluid; with previous dialysis performed on Mar 27, 2022.  Patient usual the schedule of home dialysis is Monday, Tuesday, Thursday, Friday 4 days weekly but this was altered due to his recent trip.  Patient remains on antirejection medications for his failed transplant and diuretics.  Patient follows with transplant physician and nephrologist at [facility]. ? ?In the ED, patient was notably hypoxic with SPO2 88% and improved to 95% on 2 L nasal count.  WBC count 14.9; although remains on Decadron.  Hemoglobin 11, platelets 133, BNP 2000 491, BUN  61, creatinine 5.9.  Initial lactic acid 3.4 and repeated 2.8.  Chest x-ray negative for acute cardiopulmonary disease process.  Due to hypoxia, hospital service consulted for further evaluation and management. ? ?Hospital course: ? ?Acute hypoxic respiratory failure, POA ?Patient presenting to ED with shortness of breath, found to be hypoxic with SPO2 88% on room air.  Elevated BNP greater than 2000 with altered recent hemodialysis schedule due to trip to [place].  Left lower extremity duplex ultrasound negative for DVT.  Also consideration of recent COVID-19 viral infection as contributing factor.  Patient was hemodialyzed during his hospitalization with improvement of his symptoms and resolution of hypoxia.  May need updated dry weight per nephrology outpatient. ? ?COVID-19 viral infection ?Patient recently diagnosed with COVID-19 viral infection on trip to [place].  He was seen and hospitalized for 48 hours and discharged home on Decadron.  Chest x-ray with no acute cardiopulmonary disease process.  Continue Decadron to complete 10-day course. ? ?ESRD on home hemodialysis ?Patient dialysis with a home hemodialysis unit.  Usually dialyzes on a Monday, Tuesday, Thursday, Friday schedule.  Dialyzes through an AV fistula left upper extremity.  Nephrology was consulted and underwent hemodialysis on 04/01/2022 with improvement of his hypoxia.  Continue to follow-up with nephrology outpatient and home dialysis as scheduled.  Continue prednisone 5 mg p.o. daily, Prograf 1 mg p.o. twice daily.  May need updated dry weight. ? ?Chronic systolic congestive heart failure ?Last TTE 07/2021 with LVEF 45% at [facility].  Continues with volume management with home HD unit and continues to utilize Demadex 20 mg p.o. daily.  Continue carvedilol  12.5 mg p.o. twice daily, Entresto 49-51 mg p.o. twice daily.  Continue Crestor 20 mg p.o. daily and aspirin 81 mg p.o. daily.  Outpatient follow-up with  cardiology. ? ?Type 2 diabetes mellitus ?Continue Lantus 14 units [place] daily, Humalog sliding scale. ? ?Hyperlipidemia: Crestor 20 mg p.o. daily ? ?BPH: Tamsulosin 0.4 mg p.o. daily ? ?Paroxysmal atrial fibrillation ?Currently in normal sinus rhythm, continue home carvedilol 12.5 mg p.o. twice daily.  Eliquis. ? ? ? ? ?Discharge Diagnoses:  ?Principal Problem: ?  COVID-19 virus infection ?Active Problems: ?  Acute hypoxemic respiratory failure due to COVID-19 [facility]) ?  ESRD on hemodialysis ([place]) - uses home HD unit ?  PAF (paroxysmal atrial fibrillation) ([place]) ?  Chronic anticoagulation ?  Diabetes mellitus type 2, controlled ([place]) ?  Heart failure with reduced ejection fraction (H[facility]) - LVEF 45% at [facility] in 9/22 ?  OSA on CPAP ?  Immunosuppressed status ([place]) ?  Kidney transplant failure ? ? ? ?Discharge Instructions ? ?Discharge Instructions   ? ? (HEART FAILURE PATIENTS) Call MD:  Anytime you have any of the following symptoms: 1) 3 pound weight gain in 24 hours or 5 pounds in 1 week 2) shortness of breath, with or without a dry hacking cough 3) swelling in the hands, feet or stomach 4) if you have to sleep on extra pillows at night in order to breathe.   Complete by: As directed ?  ? Call MD for:  difficulty breathing, headache or visual disturbances   Complete by: As directed ?  ? Call MD for:  extreme fatigue   Complete by: As directed ?  ? Call MD for:  persistant dizziness or light-headedness   Complete by: As directed ?  ? Call MD for:  persistant nausea and vomiting   Complete by: As directed ?  ? Call MD for:  severe uncontrolled pain   Complete by: As directed ?  ? Call MD for:  temperature >100.4   Complete by: As directed ?  ? Diet - low sodium heart healthy   Complete by: As directed ?  ? Increase activity slowly   Complete by: As directed ?  ? No wound care   Complete by: As directed ?  ? ?  ? ?Allergies as of 04/01/2022   ? ?   Reactions  ? Bee Venom Swelling  ? Vancomycin Other (See Comments)  ? Flushing  - resolves when infusion is slow  ? ?  ? ?  ?Medication List  ?  ? ?TAKE these medications   ? ?acetaminophen 500 MG tablet ?Commonly known as: TYLENOL ?Take 1,000 mg by mouth every 6 (six) hours as needed for mild pain. ?  ?aspirin EC 81 MG tablet ?Take 81 mg by mouth daily. Swallow whole. ?  ?calcium acetate 667 MG capsule ?Commonly known as: PHOSLO ?Take 1,334 mg by mouth 3 (three) times daily with meals. ?  ?carvedilol 12.5 MG tablet ?Commonly known as: COREG ?Take 12.5 mg by mouth 2 (two) times daily with a meal. ?  ?dexamethasone 6 MG tablet ?Commonly known as: DECADRON ?Take 6 mg by mouth See admin instructions. Qd x 10 days ?  ?doxercalciferol 0.5 MCG capsule ?Commonly known as: HECTOROL ?Take 0.5 mcg by mouth every Monday, Wednesday, and Friday. ?  ?Eliquis 5 MG Tabs tablet ?Generic drug: apixaban ?Take 5 mg by mouth 2 (two) times daily. ?  ?Entresto 49-51 MG ?Generic drug: sacubitril-valsartan ?Take 0.5 tablets by mouth 2 (two) times daily. ?  ?  HumaLOG KwikPen 100 UNIT/ML KwikPen ?Generic drug: insulin lispro ?Inject 0-4 Units into the skin See admin instructions. 4 units with each meals plus additional units per carb ratio ?  ?Lantus SoloStar 100 UNIT/ML Solostar Pen ?Generic drug: insulin glargine ?Inject 14 Units into the skin at bedtime. ?  ?lidocaine-prilocaine cream ?Commonly known as: EMLA ?Apply 1 application topically See admin instructions. Monday,tuesday,thursday,friday with dialysis ?  ?mupirocin ointment 2 % ?Commonly known as: BACTROBAN ?Apply 1 application. topically See admin instructions. After dialysis Monday, Tuesday,Thursday,friday ?  ?NON FORMULARY ?CPAP at bedtime ?  ?pantoprazole 80 mg/100 mL (0.8 mg/mL) Soln ?Commonly known as: PROTONIX ?Inject 8 mg/hr into the vein continuous. ?  ?predniSONE 5 MG tablet ?Commonly known as: DELTASONE ?Take 5 mg by mouth daily with breakfast. Continuously ?  ?rosuvastatin 40 MG tablet ?Commonly known as: CRESTOR ?Take 20 mg by mouth at bedtime. ?   ?tacrolimus 1 MG capsule ?Commonly known as: PROGRAF ?Take 1 mg by mouth 2 (two) times daily. ?  ?tamsulosin 0.4 MG Caps capsule ?Commonly known as: FLOMAX ?Take 0.4 mg by mouth daily. ?  ?torsemide 20 MG tablet ?Co

## 2022-04-01 NOTE — Care Management Obs Status (Signed)
MEDICARE OBSERVATION STATUS NOTIFICATION ? ? ?Patient Details  ?Name: Nicholas Glenn ?MRN: [id number] ?Date of Birth: 1956-01-24 ? ? ?Medicare Observation Status Notification Given:  Yes ? ? ? ?Bartholomew Crews, RN ?04/01/2022, 1:19 PM ?

## 2022-04-01 NOTE — Plan of Care (Signed)
?  Problem: Education: ?Goal: Knowledge of disease and its progression will improve ?Outcome: Completed/Met ?  ?

## 2022-04-01 NOTE — Progress Notes (Signed)
?[facility] ?Progress Note  ? ?Subjective:   Had dialysis overnight - went at 2am which is not ideal.  Tolerated the treatment.   UF 1.7L to EDW 84kg.  On 1L [place] this AM after post HD O2 sat 88%.  Continued coughing, no other new symptoms ? ?Objective ?Vitals:  ? 04/01/22 1027 04/01/22 2536 04/01/22 0844 04/01/22 0851  ?BP:      ?Pulse: 92 99 92 92  ?Resp: '16  14 18  '$ ?Temp:      ?TempSrc:      ?SpO2: (!) 87% (!) 85% 91% (!) 88%  ?Weight:      ?Height:      ? ?Physical Exam ?General: tired appearing, lying in bed ?Heart: RRR ?Lungs: clear posteriorly, normal WOB ?Abdomen: soft ?Extremities: trace LLE edema ?Dialysis Access:  LUE AV access +t/b, dressing intact ? ?Additional Objective ?Labs: ?Basic Metabolic Panel: ?Recent Labs  ?Lab 03/31/22 ?6440  ?NA 135  ?K 3.6  ?CL 91*  ?CO2 29  ?GLUCOSE 211*  ?BUN 61*  ?CREATININE 5.97*  ?CALCIUM 9.1  ? ?Liver Function Tests: ?Recent Labs  ?Lab 03/31/22 ?3474  ?AST 22  ?ALT 21  ?ALKPHOS 73  ?BILITOT 1.4*  ?PROT 6.9  ?ALBUMIN 3.6  ? ?No results for input(s): LIPASE, AMYLASE in the last 168 hours. ?CBC: ?Recent Labs  ?Lab 03/31/22 ?2595  ?WBC 14.9*  ?NEUTROABS 13.3*  ?HGB 11.0*  ?HCT 32.3*  ?MCV 95.6  ?PLT 133*  ? ?Blood Culture ?   ?Component Value Date/Time  ? SDES BLOOD RIGHT HAND 03/31/2022 0033  ? SPECREQUEST  03/31/2022 0033  ?  BOTTLES DRAWN AEROBIC ONLY Blood Culture results may not be optimal due to an excessive volume of blood received in culture bottles  ? CULT  03/31/2022 0033  ?  NO GROWTH 1 DAY ?Performed at [facility], [place] [address] [place], [place] [address] ?  ? REPTSTATUS PENDING 03/31/2022 0033  ? ? ?Cardiac Enzymes: ?No results for input(s): CKTOTAL, CKMB, CKMBINDEX, TROPONINI in the last 168 hours. ?CBG: ?Recent Labs  ?Lab 03/31/22 ?0731 03/31/22 ?1122 03/31/22 ?1725 03/31/22 ?2136 04/01/22 ?6433  ?GLUCAP 215* 289* 310* 323* 148*  ? ?Iron Studies: No results for input(s): IRON, TIBC, TRANSFERRIN, FERRITIN in the last 72  hours. ?'@lablastinr3'$ @ ?Studies/Results: ?DG Chest 1 View ? ?Result Date: 03/31/2022 ?CLINICAL DATA:  Shortness of breath and cough. EXAM: CHEST  1 VIEW COMPARISON:  March 09, 2021 FINDINGS: The heart size and mediastinal contours are within normal limits. Ill-defined surgical sutures are seen overlying the medial aspect of the left apex and the mid to lower left lung. Both lungs are clear. The visualized skeletal structures are unremarkable. IMPRESSION: No active cardiopulmonary disease. Electronically Signed   By: Virgina Norfolk M.D.   On: 03/31/2022 01:16  ? ?VAS [place] LOWER EXTREMITY VENOUS (DVT) ? ?Result Date: 03/31/2022 ? Lower Venous DVT Study Patient Name:  Nicholas Glenn  Date of Exam:   03/31/2022 Medical Rec #: [id number]      Accession #:    [id number] Date of Birth: [birth year]     Patient Gender: M Patient Age:   66 years Exam Location:  [facility] Procedure:      VAS [place] LOWER EXTREMITY VENOUS (DVT) Referring Phys: Eulogio Bear --------------------------------------------------------------------------------  Indications: Swelling.  Risk Factors: COVID 19 positive. Limitations: Open wound and bandages. Comparison Study: No prior studies. Performing Technologist: Oliver Hum RVT  Examination Guidelines: A complete evaluation includes B-mode imaging, spectral Doppler, color Doppler,  and power Doppler as needed of all accessible portions of each vessel. Bilateral testing is considered an integral part of a complete examination. Limited examinations for reoccurring indications may be performed as noted. The reflux portion of the exam is performed with the patient in reverse Trendelenburg.  +-----+---------------+---------+-----------+----------+--------------+ RIGHTCompressibilityPhasicitySpontaneityPropertiesThrombus Aging +-----+---------------+---------+-----------+----------+--------------+ CFV  Full           Yes      Yes                                  +-----+---------------+---------+-----------+----------+--------------+   +---------+---------------+---------+-----------+----------+-------------------+ LEFT     CompressibilityPhasicitySpontaneityPropertiesThrombus Aging      +---------+---------------+---------+-----------+----------+-------------------+ CFV      Full           Yes      Yes                                      +---------+---------------+---------+-----------+----------+-------------------+ SFJ      Full                                                             +---------+---------------+---------+-----------+----------+-------------------+ FV Prox  Full                                                             +---------+---------------+---------+-----------+----------+-------------------+ FV Mid   Full                                                             +---------+---------------+---------+-----------+----------+-------------------+ FV Distal                                             Not well visualized +---------+---------------+---------+-----------+----------+-------------------+ PFV      Full                                                             +---------+---------------+---------+-----------+----------+-------------------+ POP      Full           Yes      Yes                                      +---------+---------------+---------+-----------+----------+-------------------+ PTV      Full                                                             +---------+---------------+---------+-----------+----------+-------------------+  PERO     Full                                                             +---------+---------------+---------+-----------+----------+-------------------+    Summary: RIGHT: - No evidence of common femoral vein obstruction.  LEFT: - There is no evidence of deep vein thrombosis in the lower extremity. However, portions of this  examination were limited- see technologist comments above.  - No cystic structure found in the popliteal fossa.  *See table(s) above for measurements and observations. Electronically signed by Monica Martinez MD on 03/31/2022 at 4:41:37 PM.    Final    ?Medications: ? ? apixaban  5 mg Oral BID  ? calcium acetate  1,334 mg Oral TID WC  ? carvedilol  12.5 mg Oral BID WC  ? dexamethasone  6 mg Oral Daily  ? insulin aspart  0-15 Units Subcutaneous TID WC  ? insulin aspart  0-5 Units Subcutaneous QHS  ? insulin glargine-yfgn  14 Units Subcutaneous QHS  ? rosuvastatin  20 mg Oral QHS  ? sacubitril-valsartan  0.5 tablet Oral BID  ? tacrolimus  1 mg Oral BID  ? tamsulosin  0.4 mg Oral Daily  ? torsemide  20 mg Oral Daily  ? ? ?Dialysis Orders:  ?HHD - NxStage MTuThF, EDW 84kg, AVF ?Home BMM meds: Calcitriol 0.65mg PO TIW, Phoslo 2/meals ?  ?Assessment/Plan: ? COVID [place]. No pneumonia on imaging, but WBC high with ^ LA. No edema on CXR.  Per primary. ? ESRD: Traditional HD while here. Had HD overnight - ended ~6:30am 5/13.  Plan next Monday if remains admitted.  ? Hypertension/volume: BP controlled. New LLE edema after flying back from [place]. LE doppler r/o'd DVT.  Edema somewhat improved after HD.   ? Anemia: Hgb 11, no ESA for now. ? Metabolic bone disease: Ca ok, Phos pending. Continue home meds. ? Nutrition: Albumin ok. ?A-fib: On Eliquis as outpatient. ASA just added last week d/t COVID Dx. 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 ?LJannifer HickMD ?04/01/2022, 8:53 AM  ?[facility] ?Pager: [phone]? ? ?

## 2022-04-05 ENCOUNTER — Ambulatory Visit
Admission: RE | Admit: 2022-04-05 | Discharge: 2022-04-05 | Disposition: A | Discharge status: Home / Self Care | Payer: Medicare Other | Source: Ambulatory Visit | Attending: Nephrology | Admitting: Nephrology

## 2022-04-05 ENCOUNTER — Other Ambulatory Visit: Payer: Self-pay | Admitting: Nephrology

## 2022-04-05 DIAGNOSIS — R059 Cough, unspecified: Secondary | ICD-10-CM

## 2022-04-05 LAB — CULTURE, BLOOD (ROUTINE X 2)
Culture: NO GROWTH
Culture: NO GROWTH
Special Requests: ADEQUATE

## 2022-04-18 NOTE — ED Provider Notes (Signed)
[facility] Provider Note   CSN: [id number] Arrival date & time: 03/30/22  2334     History  Chief Complaint  Patient presents with   Shortness of Breath    [name] is a 66 y.o. male.  [name] , a 66 y.o. 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  Hypoxic to 88% on room air on arrival, improved on 2 L nasal cannula.  Was recently in [place] and evaluated in the ED and found to have COVID was admitted for 1 night but then improved, has been on Decadron at home and was doing okay but today during home dialysis treatment had significant worsening in shortness of breath and cough.  Took off about 2.7 L during home dialysis and patient is at his dry weight.  He denies associated chest pain.  Pt is on Eliquis and reports compliance. No baseline oxygen requirement. Pt did not receive remdesivir or paxlovid when hospitalized in [place]. Reports chills and fatigue, but no fevers today.  The history is provided by the patient and the spouse.  Shortness of Breath Associated symptoms: cough   Associated symptoms: no abdominal pain, no chest pain, no fever and no vomiting       Home Medications Prior to Admission medications   Medication Sig Start Date End Date Taking? Authorizing Provider  acetaminophen (TYLENOL) 500 MG tablet Take 1,000 mg by mouth every 6 (six) hours as needed for mild pain.   Yes [provider]  apixaban (ELIQUIS) 5 MG TABS tablet Take 5 mg by mouth 2 (two) times daily.   Yes [provider]  aspirin EC 81 MG tablet Take 81 mg by mouth daily. Swallow whole.   Yes [provider]  calcium acetate (PHOSLO) 667 MG capsule Take 1,334 mg by mouth 3 (three) times daily with meals.   Yes [provider]  carvedilol (COREG) 12.5 MG tablet Take 12.5 mg by mouth 2 (two) times daily with a meal.   Yes [provider]  dexamethasone (DECADRON) 6 MG tablet Take 6 mg by mouth See admin instructions. Qd x 10 days   Yes [provider]  doxercalciferol (HECTOROL) 0.5 MCG capsule Take 0.5 mcg by mouth every Monday, Wednesday, and Friday.   Yes [provider]  ENTRESTO 49-51 MG Take 0.5 tablets by mouth 2 (two) times daily. 10/28/21  Yes [provider]  HUMALOG KWIKPEN 100 UNIT/ML KwikPen Inject 0-4 Units into the skin See admin instructions. 4 units with each meals plus additional units per carb ratio 02/16/22  Yes [provider]  LANTUS SOLOSTAR 100 UNIT/ML Solostar Pen Inject 14 Units into the skin at bedtime. 02/15/22  Yes [provider]  lidocaine-prilocaine (EMLA) cream Apply 1 application topically See admin instructions. Monday,tuesday,thursday,friday with dialysis 06/20/21  Yes [provider]  mupirocin ointment (BACTROBAN) 2 % Apply 1 application. topically See admin instructions. After dialysis Monday, Tuesday,Thursday,friday 06/20/21  Yes [provider]  NON FORMULARY CPAP at bedtime   Yes [provider]  rosuvastatin (CRESTOR) 40 MG tablet Take 20 mg by mouth at bedtime.   Yes [provider]  tacrolimus (PROGRAF) 1 MG capsule Take 1 mg by mouth 2 (two) times daily.   Yes [provider]  tamsulosin (FLOMAX) 0.4 MG CAPS capsule Take 0.4 mg by mouth daily. 05/30/21  Yes [provider]  torsemide ([place])  20 MG tablet Take 20 mg by mouth daily.   Yes [provider]  pantoprazole (PROTONIX) 80 mg/100 mL (0.8 mg/mL) SOLN Inject 8 mg/hr into the vein continuous. Patient not taking: Reported on 03/31/2022 07/23/21   Norval Morton, MD  predniSONE (DELTASONE) 5 MG tablet Take 5 mg by mouth daily with breakfast. Continuously    [provider]      Allergies    Bee venom and Vancomycin    Review of Systems   Review of Systems  Constitutional:  Positive for chills and fatigue. Negative for  fever.  HENT:  Positive for congestion and rhinorrhea.   Respiratory:  Positive for cough and shortness of breath.   Cardiovascular:  Negative for chest pain and leg swelling.  Gastrointestinal:  Negative for abdominal pain, diarrhea and vomiting.  All other systems reviewed and are negative.  Physical Exam Updated Vital Signs BP 137/76   Pulse 98   Temp 98.4 F (36.9 C) (Oral)   Resp 15   Ht '5\' 9"'$  (1.753 m)   Wt 84 kg   SpO2 96%   BMI 27.35 kg/m  Physical Exam Vitals and nursing note reviewed.  Constitutional:      General: He is not in acute distress.    Appearance: Normal appearance. He is well-developed. He is ill-appearing. He is not diaphoretic.     Comments: Pt is alert, ill-appearing   HENT:     Head: Normocephalic and atraumatic.     Nose: Congestion present.     Mouth/Throat:     Mouth: Mucous membranes are moist.     Pharynx: Oropharynx is clear.  Eyes:     General:        Right eye: No discharge.        Left eye: No discharge.  Cardiovascular:     Rate and Rhythm: Normal rate and regular rhythm.     Pulses: Normal pulses.     Heart sounds: Normal heart sounds.  Pulmonary:     Effort: No respiratory distress.     Breath sounds: Rhonchi and rales present.     Comments: Pt with tachypnea and increased work of breaking, speaking in short sentences, sattign 88% on RA, sats and work of breathing improved on 2L [place], lungs with slightly decreased air movement, some rales and rhonchi noted bilaterally, frequent coughing Abdominal:     General: There is no distension.     Palpations: Abdomen is soft.     Tenderness: There is no abdominal tenderness. There is no guarding.  Musculoskeletal:        General: No deformity.     Cervical back: Neck supple.     Right lower leg: No edema.     Left lower leg: No edema.  Skin:    General: Skin is warm and dry.  Neurological:     Mental Status: He is alert and oriented to person, place, and time.     Coordination:  Coordination normal.     Comments: Speech is clear, able to follow commands Moves extremities without ataxia, coordination intact  Psychiatric:        Mood and Affect: Mood normal.        Behavior: Behavior normal.    ED Results / Procedures / Treatments   Labs (all labs ordered are listed, but only abnormal results are displayed) Labs Reviewed  COMPREHENSIVE METABOLIC PANEL - Abnormal; Notable for the following components:      Result Value   Chloride 91 (*)  Glucose, Bld 211 (*)    BUN 61 (*)    Creatinine, Ser 5.97 (*)    Total Bilirubin 1.4 (*)    GFR, Estimated 10 (*)    All other components within normal limits  CBC WITH DIFFERENTIAL/PLATELET - Abnormal; Notable for the following components:   WBC 14.9 (*)    RBC 3.38 (*)    Hemoglobin 11.0 (*)    HCT 32.3 (*)    RDW 20.2 (*)    Platelets 133 (*)    nRBC 0.3 (*)    Neutro Abs 13.3 (*)    Abs Immature Granulocytes 0.18 (*)    All other components within normal limits  BRAIN NATRIURETIC PEPTIDE - Abnormal; Notable for the following components:   B Natriuretic Peptide 2,491.4 (*)    All other components within normal limits  LACTIC ACID, PLASMA - Abnormal; Notable for the following components:   Lactic Acid, Venous 3.4 (*)    All other components within normal limits  LACTIC ACID, PLASMA - Abnormal; Notable for the following components:   Lactic Acid, Venous 2.8 (*)    All other components within normal limits  HEMOGLOBIN A1C - Abnormal; Notable for the following components:   Hgb A1c MFr Bld 7.1 (*)    All other components within normal limits  GLUCOSE, CAPILLARY - Abnormal; Notable for the following components:   Glucose-Capillary 289 (*)    All other components within normal limits  GLUCOSE, CAPILLARY - Abnormal; Notable for the following components:   Glucose-Capillary 310 (*)    All other components within normal limits  RENAL FUNCTION PANEL - Abnormal; Notable for the following components:   Chloride 96  (*)    Glucose, Bld 200 (*)    BUN 40 (*)    Creatinine, Ser 4.59 (*)    Calcium 8.2 (*)    Albumin 3.2 (*)    GFR, Estimated 14 (*)    All other components within normal limits  CBC - Abnormal; Notable for the following components:   WBC 12.7 (*)    RBC 3.32 (*)    Hemoglobin 10.6 (*)    HCT 30.9 (*)    RDW 19.8 (*)    Platelets 120 (*)    All other components within normal limits  GLUCOSE, CAPILLARY - Abnormal; Notable for the following components:   Glucose-Capillary 323 (*)    All other components within normal limits  GLUCOSE, CAPILLARY - Abnormal; Notable for the following components:   Glucose-Capillary 148 (*)    All other components within normal limits  GLUCOSE, CAPILLARY - Abnormal; Notable for the following components:   Glucose-Capillary 177 (*)    All other components within normal limits  CBG MONITORING, ED - Abnormal; Notable for the following components:   Glucose-Capillary 215 (*)    All other components within normal limits  TROPONIN I (HIGH SENSITIVITY) - Abnormal; Notable for the following components:   Troponin I (High Sensitivity) 45 (*)    All other components within normal limits  TROPONIN I (HIGH SENSITIVITY) - Abnormal; Notable for the following components:   Troponin I (High Sensitivity) 43 (*)    All other components within normal limits  CULTURE, BLOOD (ROUTINE X 2)  CULTURE, BLOOD (ROUTINE X 2)  HIV ANTIBODY (ROUTINE TESTING W REFLEX)    EKG EKG Interpretation  Date/Time:  Friday Mar 31 2022 00:08:58 EDT Ventricular Rate:  88 PR Interval:  148 QRS Duration: 92 QT Interval:  414 QTC Calculation: 500 R Axis:  41 Text Interpretation: Sinus rhythm with occasional Premature ventricular complexes Nonspecific ST and T wave abnormality Prolonged QT Abnormal ECG When compared with ECG of 23-Jul-2021 15:27, Rate slower Confirmed by Calvert Cantor [phone]) on 04/02/2022 9:17:46 AM  Radiology DG Chest 1 View  Result Date: 03/31/2022 CLINICAL  DATA:  Shortness of breath and cough. EXAM: CHEST  1 VIEW COMPARISON:  March 09, 2021 FINDINGS: The heart size and mediastinal contours are within normal limits. Ill-defined surgical sutures are seen overlying the medial aspect of the left apex and the mid to lower left lung. Both lungs are clear. The visualized skeletal structures are unremarkable. IMPRESSION: No active cardiopulmonary disease. Electronically Signed   By: Virgina Norfolk M.D.   On: 03/31/2022 01:16   Procedures .Critical Care Performed by: Jacqlyn Larsen, PA-C Authorized by: Jacqlyn Larsen, PA-C   Critical care provider statement:    Critical care time (minutes):  30   Critical care was necessary to treat or prevent imminent or life-threatening deterioration of the following conditions:  Respiratory failure (COVID respiratory failure requiring O2)   Critical care was time spent personally by me on the following activities:  Development of treatment plan with patient or surrogate, discussions with consultants, evaluation of patient's response to treatment, examination of patient, ordering and review of laboratory studies, ordering and review of radiographic studies, ordering and performing treatments and interventions, pulse oximetry, re-evaluation of patient's condition and review of old charts   Care discussed with: admitting provider      Medications Ordered in ED Medications  ipratropium-albuterol (DUONEB) 0.5-2.5 (3) MG/3ML nebulizer solution 3 mL (3 mLs Nebulization Given 03/31/22 0143)  sodium chloride 0.9 % bolus 500 mL (0 mLs Intravenous Stopped 03/31/22 0331)  dexamethasone (DECADRON) injection 10 mg (10 mg Intravenous Given 03/31/22 0202)    ED Course/ Medical Decision Making/ A&P                           Medical Decision Making Amount and/or Complexity of Data Reviewed Labs: ordered. Radiology: ordered.  Risk Prescription drug management. Decision regarding hospitalization.   Herve Haug is a 66 y.o.  male presents to the ED for concern of SOB, this involves an extensive number of treatment options, and is a complaint that carries with it a high risk of complications and morbidity.  The differential diagnosis includes worsening COVID infection, PE, pneumonia, fluid overload, CHF exacerbation, ACS   Additional history obtained:  Additional history obtained from spoue at bedside External records from outside source obtained and reviewed including records from hospital admission for Kaycee in [place]   Lab Tests:  I Ordered, reviewed, and interpreted labs.  The pertinent results include:  leukocytosis of 14.9, likely due to steroids, no significant electrolyte derrangements, initial trop elevated at 45, likely due to ESRD, EKG w/o acute changes, lactic acid 3.4, feel this is likely multifactorial from hypoxia, COVID infection and mild dehydration.   Imaging Studies ordered:  I ordered imaging studies including CXR  I independently visualized and interpreted imaging which showed No pneumonia or other actuve cardiopulmonary disease I agree with the radiologist interpretation considered CTA, but feel PA less likely given pt is on eliquis and compliant   Cardiac Monitoring:  The patient was maintained on a cardiac monitor.  I personally viewed and interpreted the cardiac monitored which showed an underlying rhythm of: NSR   Medicines ordered and prescription drug management:  I ordered medication including decadron, duoneb  for  shortness of breath  Reevaluation of the patient after these medicines showed that the patient improved I have reviewed the patients home medicines and have made adjustments as needed   ED Course:     Consultations Obtained:  I requested consultation with the hospitalist,  and discussed lab and imaging findings as well as pertinent plan - Dr. Bridgett Larsson will see and admit the patient   Reevaluation:  After the interventions noted above, I reevaluated the  patient and found that they have :improved   Dispostion:  After consideration of the diagnostic results and the patients response to treatment feel that the patent would benefit from admission for COVID with oxygen requirement.          Final Clinical Impression(s) / ED Diagnoses Final diagnoses:  Acute respiratory failure due to COVID-19 [facility])    Rx / DC Orders ED Discharge Orders                 Janet Berlin 04/18/22 2249    Mesner, Corene Cornea, MD 04/26/22 [phone]

## 2024-02-06 ENCOUNTER — Other Ambulatory Visit: Payer: Self-pay

## 2024-02-06 DIAGNOSIS — N186 End stage renal disease: Secondary | ICD-10-CM

## 2024-02-18 ENCOUNTER — Ambulatory Visit (HOSPITAL_COMMUNITY)
Admission: RE | Admit: 2024-02-18 | Discharge: 2024-02-18 | Disposition: A | Discharge status: Home / Self Care | Source: Ambulatory Visit | Attending: Surgery | Admitting: Surgery

## 2024-02-18 DIAGNOSIS — Z992 Dependence on renal dialysis: Secondary | ICD-10-CM | POA: Diagnosis present

## 2024-02-18 DIAGNOSIS — N186 End stage renal disease: Secondary | ICD-10-CM | POA: Insufficient documentation

## 2024-02-18 NOTE — Progress Notes (Unsigned)
 [facility] [place]  ASSESSMENT / PLAN: 68 y.o. male with left arm dialysis access dysfunction.   CHIEF COMPLAINT: ***  HISTORY OF PRESENT ILLNESS: Nicholas Glenn is a 68 y.o. male ***  VASCULAR SURGICAL HISTORY: ***  VASCULAR RISK FACTORS: {FINDINGS; POSITIVE NEGATIVE:6163442277} history of stroke / transient ischemic attack. {FINDINGS; POSITIVE NEGATIVE:6163442277} history of coronary artery disease. *** history of PCI. *** history of CABG.  {FINDINGS; POSITIVE NEGATIVE:6163442277} history of diabetes mellitus. Last A1c ***. {FINDINGS; POSITIVE NEGATIVE:6163442277} history of smoking. *** actively smoking. {FINDINGS; POSITIVE NEGATIVE:6163442277} history of hypertension. *** drug regimen with *** control. {FINDINGS; POSITIVE NEGATIVE:6163442277} history of chronic kidney disease.  Last GFR ***. CKD {stage:30421363}. {FINDINGS; POSITIVE NEGATIVE:6163442277} history of chronic obstructive pulmonary disease, treated with ***.  FUNCTIONAL STATUS: ECOG performance status: {findings; ecog performance status:31780} Ambulatory status: {TNHAmbulation:25868}  CAREY 1 AND 3 YEAR INDEX Male (2pts) 75-79 or 80-84 (2pts) >84 (3pts) Dependence in toileting (1pt) Partial or full dependence in dressing (1pt) History of malignant neoplasm (2pts) CHF (3pts) COPD (1pts) CKD (3pts)  0-3 pts 6% 1 year mortality ; 21% 3 year mortality 4-5 pts 12% 1 year mortality ; 36% 3 year mortality >5 pts 21% 1 year mortality; 54% 3 year mortality   Past Medical History:  Diagnosis Date   CHF (congestive heart failure) (HCC) 10/18/2018   Hospitalized at the [place] [place]   Diabetes mellitus without complication (HCC)    GI bleed 07/23/2021   Hypertension    Melanoma (HCC) 1997, [id number], 03/2015   Polycystic kidney disease 06/11/2012    Past Surgical History:  Procedure Laterality Date   nephrectomy  05/29/2005   bilateral native   s/p kidney transplantation   08/14/2005   S/P livning donor kidney transplantation    No family history on file.  Social History   Socioeconomic History   Marital status: Married    Spouse name: Thurston Hole   Number of children: Not on file   Years of education: Not on file   Highest education level: Doctorate  Occupational History   Occupation: Retired  Tobacco Use   Smoking status: Never   Smokeless tobacco: Never  Vaping Use   Vaping status: Never Used  Substance and Sexual Activity   Alcohol use: Never   Drug use: Never   Sexual activity: Not on file  Other Topics Concern   Not on file  Social History Narrative   Not on file   Social Drivers of Health   Financial Resource Strain: Low Risk  (07/13/2023)   Received from [facility]   Overall Financial Resource Strain (CARDIA)    Difficulty of Paying Living Expenses: Not hard at all  Food Insecurity: No Food Insecurity (07/13/2023)   Received [facility]   Hunger Vital Sign    Worried About Running Out of Food in the Last Year: Never true    Ran Out of Food in the Last Year: Never true  Transportation Needs: No Transportation Needs (07/13/2023)   Received from [facility] - Transportation    In the past 12 months, has lack of transportation kept you from medical appointments or from getting medications?: No    Lack of Transportation (Non-Medical): No  Physical Activity: Insufficiently Active (06/04/2020)   Received from [facility]   Exercise Vital Sign    Days of Exercise per Week: 3 days    Minutes of Exercise per Session: 30 min  Stress: No  Stress Concern Present (06/04/2020)   Received from [facility] - Occupational Stress Questionnaire    Feeling of Stress : Not at all  Social Connections: Not on file  Intimate Partner Violence: Not on file    Allergies  Allergen Reactions   Bee Venom Swelling    Vancomycin Other (See Comments)    Flushing - resolves when infusion is slow    Current Outpatient Medications  Medication Sig Dispense Refill   acetaminophen (TYLENOL) 500 MG tablet Take 1,000 mg by mouth every 6 (six) hours as needed for mild pain.     apixaban (ELIQUIS) 5 MG TABS tablet Take 5 mg by mouth 2 (two) times daily.     aspirin EC 81 MG tablet Take 81 mg by mouth daily. Swallow whole.     calcium acetate (PHOSLO) 667 MG capsule Take 1,334 mg by mouth 3 (three) times daily with meals.     carvedilol (COREG) 12.5 MG tablet Take 12.5 mg by mouth 2 (two) times daily with a meal.     dexamethasone (DECADRON) 6 MG tablet Take 6 mg by mouth See admin instructions. Qd x 10 days     doxercalciferol (HECTOROL) 0.5 MCG capsule Take 0.5 mcg by mouth every Monday, Wednesday, and Friday.     ENTRESTO 49-51 MG Take 0.5 tablets by mouth 2 (two) times daily.     HUMALOG KWIKPEN 100 UNIT/ML KwikPen Inject 0-4 Units into the skin See admin instructions. 4 units with each meals plus additional units per carb ratio     LANTUS SOLOSTAR 100 UNIT/ML Solostar Pen Inject 14 Units into the skin at bedtime.     lidocaine-prilocaine (EMLA) cream Apply 1 application topically See admin instructions. Monday,tuesday,thursday,friday with dialysis     mupirocin ointment (BACTROBAN) 2 % Apply 1 application. topically See admin instructions. After dialysis Monday, Tuesday,Thursday,friday     NON FORMULARY CPAP at bedtime     pantoprazole (PROTONIX) 80 mg/100 mL (0.8 mg/mL) SOLN Inject 8 mg/hr into the vein continuous. (Patient not taking: Reported on 03/31/2022)     predniSONE (DELTASONE) 5 MG tablet Take 5 mg by mouth daily with breakfast. Continuously     rosuvastatin (CRESTOR) 40 MG tablet Take 20 mg by mouth at bedtime.     tacrolimus (PROGRAF) 1 MG capsule Take 1 mg by mouth 2 (two) times daily.     tamsulosin (FLOMAX) 0.4 MG CAPS capsule Take 0.4 mg by mouth daily.     torsemide (DEMADEX) 20 MG tablet Take  20 mg by mouth daily.     No current facility-administered medications for this visit.    PHYSICAL EXAM There were no vitals filed for this visit.  Constitutional: *** appearing. *** distress. Appears *** nourished.  Neurologic: CN ***. *** focal findings. *** sensory loss. Psychiatric: *** Mood and affect symmetric and appropriate. Eyes: *** No icterus. No conjunctival pallor. Ears, nose, throat: *** mucous membranes moist. Midline trachea.  Cardiac: *** rate and rhythm.  Respiratory: *** unlabored. Abdominal: *** soft, non-tender, non-distended.  Peripheral vascular: *** Extremity: *** edema. *** cyanosis. *** pallor.  Skin: *** gangrene. *** ulceration.  Lymphatic: *** Stemmer's sign. *** palpable lymphadenopathy.    PERTINENT LABORATORY AND RADIOLOGIC DATA  Most recent CBC    Latest Ref Rng & Units 04/01/2022   10:06 AM 03/31/2022   12:33 AM 07/23/2021    7:45 AM  CBC  WBC 4.0 - 10.5 K/uL 12.7  14.9  10.9  Hemoglobin 13.0 - 17.0 g/dL 16.1  09.6  4.4  C  Hematocrit 39.0 - 52.0 % 30.9  32.3  13.4   Platelets 150 - 400 K/uL 120  133  135     C Corrected result     Most recent CMP    Latest Ref Rng & Units 04/01/2022   10:06 AM 03/31/2022   12:33 AM 07/23/2021    4:58 AM  CMP  Glucose 70 - 99 mg/dL 045  409  811   BUN 8 - 23 mg/dL 40  61  56   Creatinine 0.61 - 1.24 mg/dL 9.14  7.82  9.56   Sodium 135 - 145 mmol/L 135  135  134   Potassium 3.5 - 5.1 mmol/L 3.7  3.6  3.9   Chloride 98 - 111 mmol/L 96  91  91   CO2 22 - 32 mmol/L 27  29  30    Calcium 8.9 - 10.3 mg/dL 8.2  9.1  8.5   Total Protein 6.5 - 8.1 g/dL  6.9  5.4   Total Bilirubin 0.3 - 1.2 mg/dL  1.4  0.6   Alkaline Phos 38 - 126 U/L  73  97   AST 15 - 41 U/L  22  12   ALT 0 - 44 U/L  21  33     Renal function CrCl cannot be calculated (Patient's most recent lab result is older than the maximum 21 days allowed.).  Hgb A1c MFr Bld (%)  Date Value  03/31/2022 7.1 (H)    No results found for:  "LDLCALC", "LDLC", "HIRISKLDL", "POCLDL", "LDLDIRECT", "REALLDLC", "TOTLDLC"   Vascular Imaging: ***  Nicholas Glenn N. Lenell Antu, MD FACS [facility] Phone Number: [phone] 02/18/2024 10:58 AM   Total time spent on preparing this encounter including chart review, data review, collecting history, examining the patient, coordinating care for this {tnhtimebilling:26202}  Portions of this report may have been transcribed using voice recognition software.  Every effort has been made to ensure accuracy; however, inadvertent computerized transcription errors may still be present.

## 2024-02-19 ENCOUNTER — Encounter: Payer: Self-pay | Admitting: Vascular Surgery

## 2024-02-19 ENCOUNTER — Ambulatory Visit (INDEPENDENT_AMBULATORY_CARE_PROVIDER_SITE_OTHER): Admitting: Vascular Surgery

## 2024-02-19 VITALS — BP 147/74 | HR 83 | Temp 97.8°F | Ht 69.0 in | Wt 197.0 lb

## 2024-02-19 DIAGNOSIS — Z992 Dependence on renal dialysis: Secondary | ICD-10-CM | POA: Diagnosis not present

## 2024-02-19 DIAGNOSIS — N186 End stage renal disease: Secondary | ICD-10-CM | POA: Diagnosis not present

## 2024-06-23 ENCOUNTER — Other Ambulatory Visit: Payer: Self-pay | Admitting: *Deleted

## 2024-06-23 DIAGNOSIS — Z992 Dependence on renal dialysis: Secondary | ICD-10-CM

## 2024-07-01 ENCOUNTER — Ambulatory Visit: Admitting: Vascular Surgery

## 2024-07-01 ENCOUNTER — Encounter (HOSPITAL_COMMUNITY)

## 2024-07-22 ENCOUNTER — Encounter (HOSPITAL_COMMUNITY)

## 2024-07-22 ENCOUNTER — Ambulatory Visit: Admitting: Vascular Surgery

## 2024-10-03 NOTE — Consults (Signed)
 Nephrology Consult Note: Transplant  History of Present Illness   CONLEE SLITER is a 68 y.o. 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 Nephrology is consulted for TXP IS recommendations.   Initially presented with SOB, increased WOB, BLE over 7 days. No CP fevers, chills, nausea vomiting diarrhea. Endorsed decreased UOP on torsemide .   History relevant from the available documentation cited below. In summary, he had operative course significant for hematoma from bleeding after some of the renal capsule came off on the superior portion and had a drain. He then came to [facility] where our abdominal TXP team reviewed the drain and recommended continued management as per his [facility] transplant team. The drain was removed and then 4 days ago he developed another fluid collection that required a drain. The wife says that he has put out between 400 and 500 of serous fluid per day. No purulent drainage. Fluid Cr 2.9 and RUS of the TXP with normal indices and no hydro.   Of note, he just had a renal biopsy (wife has full report on phone) that was summarized as per below - no evidence of rejection.   Operative notes: It was a right kidney and appropriate size for the intended recipient with 2 arteries on a common patch, single vein, and single ureter. There was repaired biopsy site at the superior pole. There was a 3 cm simple cyst around the mid pole. The perirenal tissues were removed including the adrenal glands, but leaving just enough tissue to ensure vascularity of the ureter. The renal artery and vein were skeletonized. There were 2 arteries a main renal artery that had a diameter of about 1 cm and in accessory  superior pole cortical branch which was eventually sacrificed. The renal vein was lengthened by applying staple cephalad and caudad to the vena cava adding about 2 cm of length. When the dissection was completed, the kidney was left in the slush-filled metal basin with a cold preservation fluid. We then turned our attention to the recipient...[id number] kidney promptly pinked up, with good flow through the artery and vein. Bleeding points noted were identified and controlled. Of note, the bleeding was a bit profuse from the raw areas with no major vascular bleed. This is attributed to Eliquis . We applied hemostatic agents (Evista and Surgiflo) to control the bleeding. After appropriate hemostasis with then proceeded to perform the ureteroneocystostomy. The bladder was filled up, and an opening through the detrusor muscle was made to expose the intact mucosa. At this point the ureter was trimmed and spatulated. The bladder mucosa was then opened and a mucosa to mucosa anastomosis was performed using 5-0 PDS in continuous manner. A double-J ureteral stent was inserted prior to the completion of the anastomosis. An antireflux tunnel was then created by reapproximating the detrusor muscles in a figure-of-eight manner using 4-0 PDS. When this was completed, we finalized our hemostasis. We checked on the kidney one final time which is this point appeared pink and felt firm. We then proceeded to close the anterior rectus sheath after flushing the whole area with warm antibiotic solution. Prior to the closure, a French 19 JP drain was placed and anchored. Heavy PDS was used to reapproximate the anterior rectus sheath. The subcutaneous  layer was then reapproximated using Vicryl. The skin was then closed subcuticularly.  Renal biopsy: Significant Findings 11/10 allograft biopsy report was called to Dr. Suliman. No rejection, positive for ATN, C4d negative, positive calcium  phosphate crystals, atherosclerotic changes, and 25%  interstitial fibrosis. 11/10 DSA Class I and II absent. 11/10 CXR showed small bilateral pleural effusions with consolidation. 11/10 Noncontrast CT abdomen and pelvis findings: 1. Bilateral pleural effusions. 2. Multiple liver hypodensities, likely cysts. 3. Surgically absent native kidneys. 4. Right to left lower quadrant transplant kidneys with severe atrophy of the left transplant kidney. Right kidney demonstrates cystic structure in the central collecting system region as well as mild perinephric fat stranding. 5. Mild ascites in the upper abdomen region and left lower quadrant region. 6. Prominent scrotal edema and swelling with hydroceles [1] Procedures and Treatment Provided Renal allograft biopsy on 11/10 in Clinic. 11/11 CT guided drain placement per IR team   ROS   The review of systems was negative unless otherwise stated in the HPI.   Past Medical History    Patient Active Problem List   Diagnosis Date Noted  . Acute on chronic diastolic CHF (congestive heart failure) (CMS/HHS-HCC) 08/29/2024  . Pneumonia of left upper lobe due to infectious organism 08/02/2024  . Elevated PSA, less than 10 ng/ml 04/24/2023  . Screening for colon cancer 01/09/2023  . Pre-procedural examination 12/27/2022  . Actinic keratosis 07/18/2022  . Encounter for removal of sutures 07/18/2022  . Kidney transplant failure (HHS-HCC) 07/18/2022  . Neoplasm of uncertain behavior of skin 07/18/2022  . Nevus, non-neoplastic 07/18/2022  . Renal failure 07/18/2022  . 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 COVID-19 03/23/2022  . 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 Anaphylactic shock, unspecified, initial encounter 07/29/2021  . Heart failure with reduced ejection fraction (CMS/HHS-HCC) 07/23/2021  . Chronic anticoagulation 07/23/2021  . ESRD on hemodialysis (CMS/HHS-HCC) 07/23/2021  . Abdominal pain, right upper quadrant   . Other nonthrombocytopenic  purpura 06/15/2021  . Acidosis 04/04/2021  . Hypo-osmolality and hyponatremia 04/04/2021  . Unspecified abnormal findings in urine 03/31/2021  . ESRD (end stage renal disease) on dialysis (CMS/HHS-HCC) 03/19/2021  . Hypertensive chronic kidney disease with stage 1 through stage 4 chronic kidney disease, or unspecified chronic kidney disease 03/16/2021  . Moderate protein-calorie malnutrition (HHS-HCC) 03/16/2021  . Dependence on renal dialysis () 03/14/2021  . Fluid overload, unspecified 03/14/2021  . Allergy, unspecified, initial encounter 03/09/2021  . Anemia in chronic kidney disease 03/09/2021  . Contact with and (suspected) exposure to tuberculosis 03/09/2021  . Hyperkalemia 03/09/2021  . Other disorders resulting from impaired renal tubular function 03/09/2021  . Other specified diseases of liver 03/09/2021  . Secondary hyperparathyroidism of renal origin (HHS-HCC) 03/09/2021  . Chronic systolic CHF (congestive heart failure) (CMS/HHS-HCC) 02/14/2021  . Coronary artery calcification seen on CAT scan 02/14/2021  . Pre-transplant evaluation for kidney transplant 01/10/2021  . Iron  deficiency anemia due to chronic blood loss 12/02/2020  . Skin wound from surgical incision 09/13/2020  . Clostridium difficile infection 09/04/2020  . Atrial fibrillation (CMS/HHS-HCC) 08/05/2020  . Coronary artery disease involving native coronary artery of native heart without angina pectoris 04/08/2020  . Mixed hyperlipidemia 04/08/2020  . Atrial flutter, paroxysmal (CMS/HHS-HCC) 04/08/2020  . CKD (chronic kidney disease) stage 5, GFR less than 15 ml/min (CMS/HHS-HCC) 12/04/2019  . Diverticulitis of large intestine with abscess without bleeding 10/13/2019  . Diverticulitis 10/06/2019  . OSA (obstructive sleep apnea) 01/04/2019  . Hypercalcemia  10/30/2018  . ADPKD (autosomal dominant polycystic kidney disease) 10/21/2018  . Bradycardia 10/21/2018  . Hypertensive heart and kidney disease w/ CKD  (chronic kidney disease) 10/21/2018  . Hypokalemia 10/21/2018  . Renal transplant disorder (HHS-HCC) 10/21/2018  . Transient alteration of awareness 10/21/2018  . Congestive heart failure of unknown etiology (CMS/HHS-HCC) 10/18/2018  . Personal history of other malignant neoplasm of skin 05/07/2015  . Diverticulitis of large intestine with perforation without bleeding 04/19/2015  . 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 Secondary sarcoma of left lung (CMS/HHS-HCC)   . Lung nodules 12/24/2013  . Immunosuppressed status (HHS-HCC) 05/22/2013  . Type 2 diabetes mellitus without complication, with long-term current use of insulin  (HCC) 03/20/2013  . Sarcoma of lower extremity 02/27/2013  . PKD (polycystic kidney disease) 06/11/2012  . Essential hypertension 06/11/2012  . History of melanoma, 1997 06/11/2012  . Squamous cell carcinoma of forehead 11/20/2005  . 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 S/p nephrectomy, bilateral native 05/29/2005    Social History   Socioeconomic History  . Marital status: Married    Spouse name: Majid Mccravy  Occupational History  . Occupation: [employer]  . Occupation: Retired CDR in the [employer]  Tobacco Use  . Smoking status: Never    Passive exposure: Never  . Smokeless tobacco: Never  Vaping Use  . Vaping status: Never Used  Substance and Sexual Activity  . Alcohol use: Never  . Drug use: Never  . Sexual activity: Defer    Partners: Female    Birth control/protection: Post-menopausal, None    Comment: Married  Social History Narrative   Patient lives with his wife and 1 cat. Patient is a Environmental Health Practitioner. Wife will be support   Social Drivers of Corporate Investment Banker Strain: Low Risk  (08/30/2024)   Overall Financial Resource Strain (CARDIA)   . Difficulty of Paying Living Expenses: Not hard at all  Food Insecurity: No Food Insecurity (08/30/2024)   Hunger  Vital Sign   . Worried About Programme Researcher, Broadcasting/film/video in the Last Year: Never true   . Ran Out of Food in the Last Year: Never true  Transportation Needs: No Transportation Needs (08/30/2024)   PRAPARE - Transportation   . Lack of Transportation (Medical): No   . Lack of Transportation (Non-Medical): No  Physical Activity: Insufficiently Active (06/04/2020)   Exercise Vital Sign   . Days of Exercise per Week: 3 days   . Minutes of Exercise per Session: 30 min  Stress: No Stress Concern Present (06/04/2020)   [employer] of Occupational Health - Occupational Stress Questionnaire   . Feeling of Stress : Not at all  Housing Stability: Low Risk  (10/02/2024)   Housing Stability Vital Sign   . Unable to Pay for Housing in the Last Year: No   . Number of Times Moved in the Last Year: 0   . Homeless in the Last Year: No    Family History  Problem Relation Name Age of Onset  . Cataracts Mother    . Dementia Mother    . Kidney disease Father Raykwon Hobbs   . High blood pressure (Hypertension) Father Jessi Jessop        No comment  . Polycystic kidney disease Father Dawit Tankard   . ESRD-Transplant Father Allison Silva   . Kidney cancer Brother Mizael Sagar        No comment  . ESRD-Transplant Brother Aaryn Sermon   .  Cancer Brother Oaklan Persons        Kidney cancer  . Colon cancer Maternal Grandfather Evertt Sherill        No comment  . Polycystic kidney disease Son    . Anesthesia problems Neg Hx    . Malignant hypertension Neg Hx    . Malignant hyperthermia Neg Hx    . Pseudochol deficiency Neg Hx    . PONV Neg Hx    . Blindness Neg Hx    . Diabetes type I Neg Hx    . Glaucoma Neg Hx    . Macular degeneration Neg Hx      Allergies   Allergies  Allergen Reactions  . Venom-Honey Bee Swelling    Home Medications   Current Outpatient Medications  Medication Instructions  . apixaban  (ELIQUIS ) 5 mg, Oral, Every 12 hours, Resume taking on 2/23  . blood glucose  diagnostic test strip Use as instructed.to test blood sugars four  times daily e11.9 freestyle lite  test strips  . blood-glucose meter,continuous (DEXCOM G6 RECEIVER) Misc 1 Device, Misc.(Non-Drug; Combo Route), Every 3 months  . blood-glucose sensor (DEXCOM G6 SENSOR) Devi 1 Device, Misc.(Non-Drug; Combo Route), Every 10 days  . blood-glucose sensor (DEXCOM G7 SENSOR) Devi 1 each, Every 10 days  . blood-glucose,receiver,cont (DEXCOM G7 RECEIVER) Misc Use  . carvediloL  (COREG ) 12.5 mg, 2 times Daily  . cyanocobalamin (VITAMIN B12) 1,000 mcg, Oral, Daily  . doxercalciferoL  (HECTOROL ) 0.5 mcg, Oral, Daily  . insulin  LISPRO (HUMALOG KWIKPEN INSULIN ) pen injector (concentration 100 units/mL) 4-6 units TID AC plus SSI prn ( up to 70 units per day) 1 unit per 5 gm carb  . lancets 28 gauge Misc 1 each, Subcutaneous, 4 times Daily  . LANTUS  SOLOSTAR U-100 INSULIN  15 Units, Subcutaneous, Daily  . magnesium  oxide (MAG-OX) 400 mg (241.3 mg magnesium ) tablet 3 tablets, 3 times Daily  . metoprolol SUCCinate (TOPROL-XL) 50 mg, Daily  . mycophenolate  (MYFORTIC ) 180 MG DR tablet Takes two tablets twice a day  . [Paused] NIFEdipine (PROCARDIA-XL) 30 mg, Daily  . pantoprazole  (PROTONIX ) 40 mg, Every morning  . pen needle, diabetic (BD ULTRA-FINE NANO PEN NEEDLE) 32 gauge x 5/32 Ndle 1 each, Subcutaneous, 5 times Daily  . predniSONE  (DELTASONE ) 5 mg, Every morning  . PREVYMIS  480 mg, Daily  . rosuvastatin  (CRESTOR ) 10 mg, Oral, Daily  . sulfamethoxazole -trimethoprim  (BACTRIM  SS) 400-80 mg tablet 1 tablet, Daily  . tacrolimus  (ENVARSUS  XR) 1.5 mg, Daily  . tacrolimus  (PROGRAF ) 0.5 MG capsule Take 3 capsules (1.5 mg total) by mouth once daily AND 2 capsules (1 mg total) at bedtime.  . TORsemide  (DEMADEX ) 20 mg, Oral, As needed, Take 20 mg daily. If you gain more than 3 lbs in 1 day, please take 40 mg that day (2 tablets).  . TORsemide  (DEMADEX ) 10 mg, Daily  . WES-PHOS 250 NEUTRAL 250 mg tablet 2 tablets, 2  times Daily     Active Medications   . apixaban   5 mg Oral Q12H  . carvediloL   12.5 mg Oral Q12H SCH   And  . carvediloL   6.25 mg Oral Q12H SCH  . cyanocobalamin  1,000 mcg Oral Daily  . insulin  GLARGINE-yfgn  10 Units Subcutaneous Daily  . insulin  LISPRO (AdmeLOG, HumaLOG) injection  0-12 Units Subcutaneous TID CC  . letermovir   480 mg Oral Daily  . magnesium  oxide  1,200 mg Oral TID  . mycophenolate   360 mg Oral Q12H SCH  . pantoprazole   40 mg Oral Daily  .  potassium chloride   40 mEq Oral Once  . predniSONE   5 mg Oral Daily  . rosuvastatin   10 mg Oral Daily  . sodium phosphate -potassium phosphate  2 tablet Oral BID  . sulfamethoxazole -trimethoprim   1 tablet Oral Daily  . tacrolimus   1.5 mg Oral Daily    Objective    Current Vital Signs 24h Vital Sign Ranges  T 36.6 C (97.8 F) Temp  Avg: 36.6 C (97.8 F)  Min: 36.4 C (97.6 F)  Max: 36.6 C (97.9 F)  BP 115/83 BP  Min: 115/83  Max: 160/89  HR (S) 98 Pulse  Avg: 97  Min: 82  Max: 156  RR 20 Resp  Avg: 19.3  Min: 13  Max: 26  SaO2 97 %   SpO2  Avg: 95.8 %  Min: 93 %  Max: 98 %          Ins & Outs I/O last 2 completed shifts: In: -  Out: 1500 [Urine:1500] Current Shift:  11/14 0701 - 11/14 1900 In: -  Out: 150 [Urine:150]   Physical Exam Gen:   NAD CV:   RRR, no extra heart sounds, JVP Pulm:   normal WOB, CTAB Abd:   soft, NTND MSK:   no pedal edema  Access: None    Labs   Recent Labs  Lab 10/02/24 1017 10/02/24 1643 10/03/24 0535  WBC 11.0* 11.6* 9.4  HGB 9.1* 9.8* 9.0*  PLT 352 366 301   Recent Labs  Lab 10/02/24 1017 10/02/24 1643 10/02/24 1652 10/03/24 0535  NA 138 139 136 139  K 3.5 4.0  --  3.3*  CL 101 104  --  103  CO2 23 25  --  23  BUN 28* 28*  --  26*  CREATININE 2.7* 2.8*  --  2.6*  CALCIUM  9.2 9.6  --  9.1  ALKPHOS  --  78  --   --   GLUCOSE 178* 190*  --  118   Lab Results  Component Value Date   PTH 384 (H) 04/04/2024   CALCIUM  9.1 10/03/2024   PHOS 2.4 10/03/2024    Lab Results  Component Value Date   IRON  30 (L) 10/03/2024   TIBC 206 (L) 10/03/2024   FERRITIN 994 (H) 10/03/2024   PCTSAT 15 10/03/2024    FK506 (Tacrolimus )  Date Value Ref Range Status  10/03/2024 7.6 5.0 - 20.0 ng/mL Final  10/02/2024 6.9 5.0 - 20.0 ng/mL Final  09/25/2024 7.8 5.0 - 20.0 ng/mL Final    Recent Labs  Lab 10/02/24 1017 10/02/24 1701  COLORU Light Yellow  Light Yellow Light Yellow  CLARITYU Clear  Clear Clear  SPECGRAV 1.015  1.014 1.011  LABPH 6.0  6.0 6.5  PROTEINUA 1+*  1+* 1+*  GLUCOSEU 2+*  2+* 3+*  KETONESU Negative  Negative Negative  BLOODU 1+*  1+* Trace*  NITRITE Negative  Negative Negative  LEUKOCYTESUR Negative  Negative Negative  BILIRUBINUR Negative  Negative Negative  UROBILINOGEN 0.2  0.2 0.2  RBCUA 1  2 3   WBCUA 2  2 1   SQUAMEPI 0  0 0  HYALINE 0  4 1    Assessment and Recommendations   KINGSTYN DERUITER is a 68 y.o. 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 Nephrology is consulted for TXP IS recommendations.   Differential diagnosis of this cardiorenal syndrome includes compressive seroma, urinoma, CKD, HF exacerbation and congestion, urinary obstruction. Rejection has been ruled out by biopsy on 09/29/2024 at OSH and there was evidence of ATN.   It is not clear why he keeps having ATN, but I think it's possible that he both has compression from a seroma and possibly bladder obstruction. His EF has also reduced which is complicating the picture.   Allograft function  S/P DDKT 06/2024 AKI, Improving ATN, biopsy proven Transplant done in [place]  s/p ATG induction. Post op course includes repeated ATN, hematoma,  seroma, drain placement. Creatinine nadir thus far around 2.0-2.2. Admission Cr 2.8>2.6. Cr of JP drain rules out urinoma and RUS is unremarkable other than fluid collection with drain in situ. With ATN on biopsy will monitor for renal recovery Daily RFP panel  Strict Is/Os PVR and I/O if >350 Maintain JP drain Agree with diuresis per primary No indication for iHD  Immunosuppression FK506 (Tacrolimus )  Date Value Ref Range Status  10/03/2024 7.6 5.0 - 20.0 ng/mL Final  10/02/2024 6.9 5.0 - 20.0 ng/mL Final  09/25/2024 7.8 5.0 - 20.0 ng/mL Final   CNI: Envarsus  1.5mg  qdaily FK Goal: 5-7 Next FK level: 10/04/2024 MPA: MMF 360 q12h Steroids: Prednisone  5  Prophylaxis Continue Bactrim  SS for PJP ppx Continue Letermovir  480 mg for CMV ppx  Thank you for this consult. We will continue to follow the patient with you.  Please feel free to call with questions or concerns.   NORLEEN PATSY KINGSLEY, MD  Transplant service pager [phone]  The Nephrology Consult team is available in-house from 7A-5P. If urgent assistance is needed outside of these hours, please page the Nephrology on-call pager [phone] for assistance. The Nephrology consult pagers are available after business hours for emergencies only.    ------------------------------------------------------------------------------- Attestation signed by Micky Cumber, Lucious, MD at 10/04/2024  2:58 PM  Attestation Statement:   I personally saw and evaluated the patient, and participated in the management and treatment plan as documented in the resident/fellow note. S/p 2nd DDKT at OSH in Aug 2025, with post txp course notable for perinephric collection, s/p drain placement, allograft dysfunction, with serum creatinine nadir at 1.9, admitted with non oliguric AKI (creatinine of 3) and SOB, noted to have worsening EF to 30% on echo yesterday. Responded to IV lasix . Cardiology planning for GDMT. Jardiance  started this morning.   Creatinine slightly improved today. Electrolytes stable. No indication for dialysis. Continue current IS and proph. Follow up with his txp center next Thursday if d/c by then.   Lucious Micky Cumber, MD  -------------------------------------------------------------------------------

## 2024-10-03 NOTE — Discharge Summary (Signed)
 [name]                          Heart Failure Discharge Summary   Admit Date: 10/02/2024  Discharge Date: 10/08/2024  Admitting Physician: Marinda Shirlene Balo, MD  Discharge Physician: Merline Lares, MD  Primary Care Provider: Fernando Earnie Logan, MD  Primary Cardiologist: Juliene Bile   Discharge Destination: Home  Discharge Services: none  Code Status: Full Code   Admission Diagnoses:  NICM (nonischemic cardiomyopathy) (CMS/HHS-HCC) [I42.8] Congestive heart failure of unknown etiology (CMS/HHS-HCC) [I50.9] Heart failure with reduced ejection fraction (CMS/HHS-HCC) [I50.20] Other hypervolemia [E87.79] Complication of transplanted kidney, unspecified complication (HHS-HCC) [T86.10] Chest pain, unspecified type [R07.9] Acute on chronic heart failure with preserved ejection fraction (HFpEF) (CMS/HHS-HCC) [I50.33] Type 2 diabetes mellitus without complications, unspecified whether long term insulin  use (CMS/HHS-HCC) [E11.9]  Discharge Diagnoses:  Principal Problem (Resolved):   Volume overload Active Problems:   Status post living-donor kidney transplantation (HHS-HCC)   Essential hypertension   Type 2 diabetes mellitus without complication, with long-term current use of insulin  (HCC)   Immunosuppressed status (HHS-HCC)   Atrial fibrillation (CMS/HHS-HCC)   ADPKD (autosomal dominant polycystic kidney disease)   Supraventricular tachycardia, unspecified (HHS-HCC)   On prednisone  therapy   Acute systolic CHF (congestive heart failure) (CMS/HHS-HCC) Resolved Problems:   Acute on chronic diastolic CHF (congestive heart failure) (CMS/HHS-HCC)     Anticipatory Guidance (key med changes, results pending, future labs, IV therapies): Presented with volume overload, now with HFrEF, initiated GDMT. Inc'd BB with episodes of SVT & afib.   - Started empagliflozin   - Started potassium supplement  - Increased Coreg   - Decreased  crestor   - Increased Torsemide   - Stopped Nifedipine  - Pt declined lifevest at time of discharge - Consider adding lisinopril in 2-3 wks from discharge  - EDW: 82 kg     Cardiac Rehab: ordered  Patient Discharge Instructions:   Ambulatory Referral to Cardiac Rehab  Referral Priority: Routine Referral Type: Consultation  Number of Visits Requested: 1   If you smoke (or have smoked within the last year), we strongly recommend that you do not smoke.   Weigh yourself daily and record   Notify cardiology provider of chest pain   Notify provider of swelling in arms, legs, or stomach   Notify provider temperature greater than 101.0 F (38.3 C) degrees   Notify provider of weight gain greater than 2 lbs in 1 day or 5 lbs in 1 week   Report questions or concerns to the [facility] at [phone]   Notify primary care physician of other symptoms   For a life-threatening emergency, call 911   Follow-up with Primary Care Provider   Follow-up with Cardiology   Notify provider of nausea or vomiting   Notify provider of dizziness or passing out   Notify provider of abdominal pain   Notify provider of difficulty breathing or shortness of breath   Moderate activity   2-gram sodium  Order Comments: And no more than 2 liter (68 ounces) of fluid intake per day   Monitor blood glucose     [facility] Provider Follow-up: Future Appointments  Date Time Provider Department Center  10/22/2024  1:20 PM Bile Juliene Lenis, MD 35F/2G CARD [facility]  12/09/2024  4:30 PM Lorren Dasie Sieving, PA ARR CARD 4 [facility]  03/04/2025  1:15 PM Azalea Sherwood BROCKS, MD 1J Nephrology [facility]  03/04/2025  3:00 PM Bulah Juliene Lenis, MD 42F/2G CARD [facility]  08/19/2025 10:30 AM Worley Venus Czar, MD [facility]  08/27/2025  9:40 AM CC CT 4 CANCT RAD [facility]  08/27/2025 10:45 AM Gaspar, Charlie Leech, MD [facility]    Non-[facility] Provider Follow-up: Renal Txp Team  [place] 12/1  Report Issues: By using [facility], or by calling the [facility] at [phone].  For urgent issues or after business hours (after 5pm on weekdays and anytime on weekends), call the [facility] [phone] and ask to page the on-call cardiologist.     Allergies/Intolerances:  Allergies  Allergen Reactions  . Venom-Honey Bee Swelling     Medications:     Discharge Medications     New Medications      Details  empagliflozin  10 mg tablet Commonly known as: JARDIANCE   10 mg, Oral, Daily Quantity: 30 tablet Refills: 11   potassium chloride  20 MEQ ER tablet Commonly known as: KLOR-CON  M20 Start taking on: October 09, 2024  20 mEq, Oral, 2 times Daily with meals Quantity: 60 tablet Refills: 11       Modified Medications      Details  carvediloL  12.5 MG tablet Commonly known as: COREG  What changed:  how much to take when to take this  37.5 mg, Oral, Every 12 hours Quantity: 180 tablet Refills: 11   insulin  LISPRO pen injector (concentration 100 units/mL) Commonly known as: HumaLOG KwikPen Insulin  What changed:  how to take this when to take this additional instructions  4-6 units TID AC plus SSI prn ( up to 70 units per day) 1 unit per 5 gm carb Quantity: 15 mL Refills: 0   rosuvastatin  10 MG tablet Commonly known as: CRESTOR  What changed: when to take this  10 mg, Oral, Nightly Quantity: 30 tablet Refills: 11   TORsemide  20 MG tablet Commonly known as: DEMADEX  What changed:  medication strength how much to take Another medication with the same name was removed. Continue taking this medication, and follow the directions you see here.  60 mg, Oral, Daily Quantity: 90 tablet Refills: 11       Medications To Continue      Details  apixaban  5 mg tablet Commonly known as: ELIQUIS   5 mg, Oral, Every 12 hours, Resume taking on 2/23 Refills: 0   blood glucose diagnostic test strip  Use as  instructed.to test blood sugars four  times daily e11.9 freestyle lite  test strips Quantity: 400 each Refills: 3   cyanocobalamin 1000 MCG tablet Commonly known as: VITAMIN B12  1,000 mcg, Oral, Daily Quantity: 30 tablet Refills: 11   DEXCOM G7 RECEIVER Misc  Use Refills: 0   DEXCOM G7 SENSOR Devi  1 each, Every 10 days Refills: 0   doxercalciferoL  0.5 MCG capsule Commonly known as: HECTOROL   0.5 mcg, Oral, Daily Refills: 0   ENVARSUS  XR 0.75 mg extended-release tablet Generic drug: tacrolimus   1.5 mg, Daily Refills: 0   lancets 28 gauge Misc  1 each, Subcutaneous, 4 times Daily Quantity: 400 each Refills: 3   LANTUS  SOLOSTAR U-100 INSULIN  pen injector (concentration 100 units/mL) Generic drug: insulin  GLARGINE  15 Units, Subcutaneous, Daily Quantity: 15 mL Refills: 3   magnesium  oxide 400 mg (241.3 mg magnesium ) tablet Commonly known as: MAG-OX  3 tablets, 3 times Daily Refills: 0   mycophenolate  180  MG DR tablet Commonly known as: MYFORTIC   Takes two tablets twice a day Refills: 0   pantoprazole  40 MG DR tablet Commonly known as: PROTONIX   40 mg, Every morning Refills: 0   pen needle, diabetic 32 gauge x 5/32 Ndle Commonly known as: BD ULTRA-FINE NANO PEN NEEDLE  1 each, Subcutaneous, 5 times Daily Quantity: 500 each Refills: 3   predniSONE  5 MG tablet Commonly known as: DELTASONE   5 mg, Every morning Refills: 0   PREVYMIS  480 mg tablet Generic drug: letermovir   480 mg, Daily Refills: 0   sulfamethoxazole -trimethoprim  400-80 mg tablet Commonly known as: BACTRIM  SS  1 tablet, Daily Refills: 0   WES-PHOS 250 NEUTRAL 250 mg tablet Generic drug: sodium phosphate -potassium phosphate  2 tablets, 2 times Daily Refills: 0       Stopped Medications    NIFEdipine 30 MG (OSM) XL tablet Commonly known as: PROCARDIA-XL        ARNI/ACE/ARB: Contraindicated due to renal insufficiency/failure SGLT2i: Prescribed Beta Blocker:  Prescribed MRA: Contraindicated due to renal insufficiency/failure Hydralazine/Nitrates: Not indicated   Brief History of Present Illness: Per the H&P dated on 10/02/2024:  JOHNGABRIEL Glenn is a 68 y.o. 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  He was more recently hospitalized here at [facility] last month for pneumonia.  He reports that since then he has been getting progressively more short of breath and swelling particularly of his left lower extremity.  The shortness of breath was initially with exertion and now does happen with standing and moving around slightly.  His torsemide  dose was increased from as needed to 20 mg daily several weeks ago and for the past week he has been on 40 mg daily without much increase in his urine output.  Due to some decline in his kidney function as measured by creatinine he underwent a kidney biopsy recently that did not show any signs of rejection per his report.  This was done in [place] .  He has not had any recent fevers or chills or productive cough.  When he does well it is always asymmetric favoring his left lower extremity but that is where he had his sarcoma resected previously.   Last nephrology note suggests FK trough 5-7 ng/ml  _____________________  Hospital Course by Problem:   # Volume overload-resolved # Acute HFrEF # HTN Presented with ongoing LE edema and dyspnea despite increasing home diuretics. NM study 09/2023 with EF 50%. ProBNP 40k on admission. Repeat  TTE demonstrated EF down to 30%. Diuresed with IV lasix  and transitioned to uptitrated 60 mg torsemide  daily. For GDMT, uptitrated carvedilol  and added empagliflozin .  Discontinued nifedipine given low EF.  Pt declined lifevest at time of discharge. Pt will follow-up with Dr. DeVore 12/3 at which time low dose lisinopril may be considered if acceptable to pt's renal transplant providers in [facility].   #pAFib #SVT SR on telemetry, with asymptomatic episodes of afib and SVT 150s. Continue home apixaban  and up titrated carvedilol  with resolution of SVT, improved HR control.   # Autosomal dominant polycystic  kidney disease (ADPKD) # s/p kidney transplant 06/2024 # Immunosuppressed status  Progressive decrease in renal function, has had 2 biopsies of kidney to assess rejection given Cr trend, which were both negative (done in [place]). Continue home tacrolimus , last FK level 7.6. Transplant nephrology team consulted and guided immunosuppression. Renal US  revealed mild pelviectasis without frank hydronephrosis and decreased size of complex peritransplant collection (see below) c/w [facility] prior. Continue home MMF and prednisone . Continue home letermovir  for CMV ppx. Continue home Bactrim  for PJP ppx   #DM  A1C 5.9% Sept 2025. Continue home insulin  regimen.   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 Given unilateral LE edema, LLE US  done, negative for DVT.     Social Drivers of Health with Concerns   Physical Activity: Insufficiently Active (06/04/2020)   Exercise Vital Sign   . Days of Exercise per Week: 3 days   . 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  Electrolyte Disorders:    Hypokalemia:  Hypokalemia present with lowest potassium of 3.3.  Potassium supplement administered.  Will continue to monitor.   Hematologic Disorders:    Anemia:   Will continue to monitor.           Imaging and Procedures Performed:   CXR 11/13 Unchanged cardiac and mediastinal contours. Similar pulmonary vascular congestion. 2. Similar, bilateral lower lobe predominant patchy opacities, left greater than right, which partially obscure both hemidiaphragms, which is favored to represent a combination of atelectasis and layering small bilateral pleural effusions. 3. No focal consolidation. 4. No pneumothorax.  Left LE US  11/13 1. No deep vein thrombosis identified in the left common femoral, femoral, and popliteal veins. 2. Limited visualization of the posterior tibial and peroneal veins. No definite thrombus is identified.  US  renal transplant complete 11/14 1.  Mild pelviectasis without frank hydronephrosis. 2.  Normal velocities and parenchymal resistive indices. 3.  Decreased size of a complex peritransplant collection now measuring up to 5.9 cm, previously 8.3 cm.  TTE 11/14 MODERATE LEFT VENTRICULAR SYSTOLIC DYSFUNCTION WITH NO LVH, LV SIZE IS MILDLY ENLARGED ESTIMATED EF: 30%, CALC EF(3D): 33% ELEVATED LA PRESSURES WITH DIASTOLIC DYSFUNCTION (GRADE 3) NORMAL RIGHT VENTRICULAR SYSTOLIC FUNCTION VALVULAR REGURGITATION: No AR, MILD MR, TRIVIAL PR, MILD TR                                 ESTIMATED RVSP: 37 mmHg (Normal) NO VALVULAR STENOSIS                                                                        Compared with prior Echo study on 08/21/2023  LVEF SLIGHTLY DECREASED  _____________________  Discharge Exam:  Admission Weight: 86.7 kg (191 lb 2.2 oz)  Discharge Weight: Weight: 82.2 kg (181  lb 3.2 oz) BMI: Body mass index is 26.76 kg/m. BP (!) 148/78 (BP Location: Right upper arm, Patient Position: Sitting)   Pulse 85   Temp 36.6 C (97.8 F) (Oral)   Resp 18   Ht 175.3 cm (5' 9)   Wt 82.2 kg (181 lb 3.2 oz)   SpO2 96%   BMI 26.76 kg/m   General: alert, cooperative, and in NAD Respiratory: regular rate, symmetric, unlabored, clear to auscultation bilaterally, and no accessory muscle use Cardiac: regular rate, regular rhythm, S1, S2 present, no murmur, no rub, no gallop, and JVD non-elevated Abdomen: normal bowel sounds, soft, nontender, and nondistended Extremities: extremities warm and well perfused, no clubbing or cyanosis, distal pulses intact, and edema trace LLE Lines: none  Pertinent Lab Testing:  BMP: Recent Labs  Lab 10/08/24 0722  NA 141  K 3.3*  CL 105  CO2 26  BUN 30*  CREATININE 2.8*  GLUCOSE 181*  CALCIUM  8.9  MG 1.9   CBC: Recent Labs  Lab 10/07/24 0706  WBC 9.4  HGB 9.1*  HCT 29.5*  PLT 261   INR: No results for input(s): INR in the last 168 hours.  TFTs: No results for input(s): TSH, T4FREE in the last 168 hours.       Other Pertinent Labs: None  _____________________  Time spent on discharge process:  >30 minutes  ROXENE ALTAMESE MOLES, NP  ------------------------------------------------------------------------------- Attestation signed by Merline Lares, MD at 10/08/2024  6:13 PM  Attending Attestation:    Randell Statement:   I personally saw the patient and performed a substantive portion of the medical decision making, in conjunction with the Advanced Practice Provider for the condition/treatment of [place]: 68 year old man with a history of ESRD secondary to polycystic kidney disease status post kidney transplant in 2006 and more recently in August 2025, metastatic sarcoma in remission, 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  1.  Acute on chronic HFpEF-patient appears warm and wet on exam.  Jugular veins are distended and he has ascites on exam.  He reports a recent hospitalization in October for pneumonia and has had persistent fluid gain since that time.  Concomitant with this his renal graft function has worsened.  Kidney biopsies performed in [place]  do not suggest rejection.  Suspect cardiorenal syndrome as a driver for this.  Repeat echocardiogram is pending.  Will escalate diuretics and optimize GDMT based on LVEF.  2.  Polycystic kidney disease status post kidney transplant in August 2025-currently on tacrolimus , MMF and prednisone  as well as prophylaxis for opportunistic infections.  Will consult renal transplant team for guidance on managing his immunomodulators.  3.  Atrial fibrillation/SVT-patient has a history of atrial fibrillation and presented with sinus rhythm initially.  Developed an episode of rapid narrow complex regular tachycardia, suggestive of AVNRT.  Will continue his apixaban  as prescribed as well as carvedilol .  Will continue to monitor for recurrent events of SVT or A-fib.  Is unclear whether this was the precipitant for his presentation.  LARES MERLINE, MD  -------------------------------------------------------------------------------

## 2024-10-06 NOTE — Progress Notes (Signed)
 Cardiology Progress Note    10/06/2024 Nicholas Glenn: 5  Nicholas Glenn is a 68 y.o.male who was admitted on 10/02/2024 with volume overload.   Subjective:   Mr. Nicholas Glenn denies residual volume overload.  Denies CP or dyspnea.  Denies constipation.   Objective:   Vital signs in last 24 hours: Temp:  [36.6 C (97.8 F)-37 C (98.6 F)] 36.6 C (97.8 F) Heart Rate:  [77-87] 81 Resp:  [16-18] 16 BP: (119-146)/(68-77) 140/69 SpO2: 94 % Last BM Date: 10/05/24  Weights: Last weight: 81.6 kg (179 lb 12.8 oz)    First weight: 86.7 kg (191 lb 2.2 oz) BMI: Body mass index is 26.55 kg/m.  24-Hour Intake/Output: I/O last 2 completed shifts: In: 1697 [P.O.:1687; Other:10] Out: 3125 [Urine:2970; Drains:155]  Telemetry: SR 70s-90s.  Three brief episodes of PAF with RVR, 9-24 beats   Physical Exam:  General: alert, cooperative, and in NAD Respiratory: regular rate, symmetric, unlabored, no accessory muscle use, and breath sounds diminished bases Cardiac: regular rate, regular rhythm, S1, S2 present, no murmur, no rub, no gallop, and JVP not elevated Abdomen: normal bowel sounds, soft, nontender, nondistended, and obese Extremities: extremities warm and well perfused, no clubbing or cyanosis, distal pulses intact, and edema 1+ LLE, trace right Lines: PIV  Labs:  BMP: Recent Labs  Lab 10/06/24 0548  NA 140  K 3.3*  CL 103  CO2 28  BUN 30*  CREATININE 3.0*  GLUCOSE 205*  CALCIUM  8.8  MG 1.8   CBC: Recent Labs  Lab 10/06/24 0548  WBC 8.8  HGB 8.7*  HCT 28.7*  PLT 278   INR: No results for input(s): INR in the last 168 hours.  FK: Recent Labs  Lab 10/05/24 0438  FK506 7.6   CYA: No results for input(s): CYA in the last 73719 hours. LDH: No results for input(s): LDH in the last 168 hours.       Scheduled Medications: .  apixaban , 5 mg, Oral, Q12H .  carvediloL , 25 mg, Oral, Q12H [facility] **AND** [DISCONTINUED] carvediloL , 6.25 mg, Oral, Q12H SCH .   cyanocobalamin, 1,000 mcg, Oral, Daily .  dextrose 50% in water, 12.5-25 g, Intravenous, As Directed .  empagliflozin , 10 mg, Oral, Daily .  glucagon, 1 mg, Subcutaneous, As Directed .  insulin  GLARGINE-yfgn, 10 Units, Subcutaneous, Daily .  insulin  LISPRO, 4 Units, Subcutaneous, TID CC .  insulin  LISPRO (AdmeLOG, HumaLOG) injection, 0-12 Units, Subcutaneous, TID CC .  letermovir , 480 mg, Oral, Daily .  lidocaine , 0.5 mL, Subcutaneous, As Directed .  magnesium  oxide, 1,200 mg, Oral, TID .  mycophenolate , 360 mg, Oral, Q12H SCH .  pantoprazole , 40 mg, Oral, Daily .  predniSONE , 5 mg, Oral, Daily .  rosuvastatin , 10 mg, Oral, QHS .  sodium phosphate -potassium phosphate, 2 tablet, Oral, BID .  sulfamethoxazole -trimethoprim , 1 tablet, Oral, Daily .  tacrolimus , 1.5 mg, Oral, Daily  Assessment/Plan:   Principal Problem:   Volume overload Active Problems:   Status post living-donor kidney transplantation (HHS-HCC)   Essential hypertension   Type 2 diabetes mellitus without complication, with long-term current use of insulin  (HCC)   Immunosuppressed status (HHS-HCC)   Atrial fibrillation (CMS/HHS-HCC)   ADPKD (autosomal dominant polycystic kidney disease)   Acute on chronic diastolic CHF (congestive heart failure) (CMS/HHS-HCC)   Supraventricular tachycardia, unspecified (HHS-HCC)   On prednisone  therapy   # Volume overload # HFpEF # HTN Presented with ongoing LE edema and SOB despite increasing home diuretics. PET 09/2023 with normal function. proBNP  40k. Still with mild edema  - Repeat echo reveals EF 30 RVSP 37 - s/p IV furosemide ; trial of torsemide  40mg  x 1 - continue Coreg  to 25 mg bid .  - continue empa 10 mg  - consider trialing outpatient lisinopril - Hold nifedipine given low EF - supplemented k/mg   #pAFib SR on telemetry, had couple episodes of SVT 150s, asymptomatic - Continue home apixaban   - Increased Coreg  to 25 mg bid 11/15 - can switch to metoprolol 200 if  he is still having runs of SVT  # Autosomal dominant polycystic kidney disease (ADPKD) # s/p kidney transplant 06/2024 # Immunosuppressed status  Progressive decrease in renal function, has had 2 biopsies of kidney to assess rejection given Cr trend, which were both negative (done in [place]).  - Continue home tacrolimus , last level 11/16 7.6 (goal 5-7) - Continue home MMF and prednisone  - Continue home letermovir  for CMV ppx - Continue home Bactrim  for PJP ppx - pt to f/u with [facility] transplant team 12/1 including management of JP drain  #DM  A1C 5.9 Sept 2025. - Continue glargine 10u, decreased from home 15u.  AM BGL < 180 - lunch/dinner/bedtime BGL in 260s-280s.  Add lispro TID AC.  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  - Given unilateral edema, LLE US  performed and was negative for DVT   Comorbid Conditions: Nutritional Disorders:    Hypoalbuminemia:    Hypoalbuminemia is associated with increased risk for patients.  We will attempt to treat the underlying condition(s) contributing to this low albumin state. 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  Electrolyte Disorders:    Hypokalemia:  Hypokalemia present with lowest potassium of 3.3.  Potassium supplement administered.  Will continue to monitor.   Hematologic Disorders:    Anemia:  Anemia present with lowest hemoglobin of 8.7.  Will continue to monitor.          Code Status: Full Code VTE Prophylaxis:  VTE Prophylaxis  + Anticoagulant Ordered  apixaban , 5 mg, Oral,  Q12H, 5 mg at 10/06/24 0857      Discharge Planning: pending clinical course   ELSIE JULIANNA GASMAN, PA-C  ------------------------------------------------------------------------------- Attestation signed by Merline Lares, MD at 10/06/2024  4:54 PM  Attending Attestation:    Randell Statement:   I personally saw the patient and performed a substantive portion of the medical decision making, in conjunction with the Advanced Practice Provider for the condition/treatment of Nicholas Glenn: 68 year old man with a history of ESRD secondary to polycystic kidney disease status post kidney transplant in 2006 and more recently in August 2025, metastatic sarcoma in remission, 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  1.  Acute on chronic HFpEF-patient appears warm and wet on exam.  Jugular veins are distended and he has ascites on exam.  He reports a recent hospitalization in October for pneumonia and has had persistent fluid gain since that time.  Concomitant with this his renal graft function has  worsened.  Kidney biopsies performed in [place]  do not suggest rejection.  Suspect cardiorenal syndrome as a driver for this.  Repeat echocardiogram is pending.  Will escalate diuretics and optimize GDMT based on LVEF.  2.  Polycystic kidney disease status post kidney transplant in August 2025-currently on tacrolimus , MMF and prednisone  as well as prophylaxis for opportunistic infections.  Will consult renal transplant team for guidance on managing his immunomodulators.  3.  Atrial fibrillation/SVT-patient has a history of atrial fibrillation and presented with sinus rhythm initially.  Developed an episode of rapid narrow complex regular tachycardia, suggestive of AVNRT.  Will continue his apixaban  as prescribed as well as carvedilol .  Will continue to monitor for  recurrent events of SVT or A-fib.  Is unclear whether this was the precipitant for his presentation.  CHERON HARBOR, MD  -------------------------------------------------------------------------------

## 2024-10-07 NOTE — Progress Notes (Signed)
 Cardiology Progress Note    10/07/2024 Hospital Day: 6  Nicholas Glenn is a 68 y.o.male who was admitted on 10/02/2024 with volume overload.   Subjective:   Mr. Housman denies acute issues overnight.  Reports responding to higher dose torsemide .  Denies CP or dyspnea.  Denies palpitations or dizziness.   Objective:   Vital signs in last 24 hours: Temp:  [36.3 C (97.4 F)-37 C (98.6 F)] 36.3 C (97.4 F) Heart Rate:  [78-82] 78 Resp:  [18-20] 18 BP: (121-138)/(66-85) 121/66 SpO2: 97 % Last BM Date: 10/07/24  Weights: Last weight: 82.7 kg (182 lb 6.4 oz)    First weight: 86.7 kg (191 lb 2.2 oz) BMI: Body mass index is 26.94 kg/m.  24-Hour Intake/Output: I/O last 2 completed shifts: In: 10 [Other:10] Out: 2445 [Urine:2305; Drains:140]  Telemetry: SR 70s-90s.  SVT (asx) x 4, the shortest 5 beats the longest 60 beats  Physical Exam:  General: alert, cooperative, and in NAD Respiratory: regular rate, symmetric, unlabored, no accessory muscle use, and breath sounds diminished bases Cardiac: regular rate, regular rhythm, S1, S2 present, no murmur, no rub, no gallop, and JVP not elevated Abdomen: normal bowel sounds, soft, nontender, nondistended, and obese Extremities: extremities warm and well perfused, no clubbing or cyanosis, distal pulses intact, and edema 1+ LLE, trace right Lines: PIV  Labs:  BMP: Recent Labs  Lab 10/07/24 0706  NA 142  K 4.0  CL 102  CO2 27  BUN 31*  CREATININE 2.9*  GLUCOSE 176*  CALCIUM  9.3  MG 2.0   CBC: Recent Labs  Lab 10/07/24 0706  WBC 9.4  HGB 9.1*  HCT 29.5*  PLT 261   INR: No results for input(s): INR in the last 168 hours.  FK: Recent Labs  Lab 10/05/24 0438  FK506 7.6   CYA: No results for input(s): CYA in the last 73719 hours. LDH: No results for input(s): LDH in the last 168 hours.       Scheduled Medications: .  apixaban , 5 mg, Oral, Q12H .  carvediloL , 37.5 mg, Oral, Q12H SCH .  cyanocobalamin,  1,000 mcg, Oral, Daily .  dextrose 50% in water, 12.5-25 g, Intravenous, As Directed .  empagliflozin , 10 mg, Oral, Daily .  glucagon, 1 mg, Subcutaneous, As Directed .  [START ON 10/08/2024] insulin  GLARGINE-yfgn, 12 Units, Subcutaneous, Daily .  [START ON 10/08/2024] insulin  LISPRO, 6 Units, Subcutaneous, TID CC .  insulin  LISPRO (AdmeLOG, HumaLOG) injection, 0-12 Units, Subcutaneous, TID CC .  letermovir , 480 mg, Oral, Daily .  lidocaine , 0.5 mL, Subcutaneous, As Directed .  magnesium  oxide, 1,200 mg, Oral, TID .  mycophenolate , 360 mg, Oral, Q12H SCH .  pantoprazole , 40 mg, Oral, Daily .  predniSONE , 5 mg, Oral, Daily .  rosuvastatin , 10 mg, Oral, QHS .  sodium phosphate -potassium phosphate, 2 tablet, Oral, BID .  sulfamethoxazole -trimethoprim , 1 tablet, Oral, Daily .  tacrolimus , 1.5 mg, Oral, Daily .  TORsemide , 60 mg, Oral, Daily  Assessment/Plan:   Principal Problem:   Volume overload Active Problems:   Status post living-donor kidney transplantation (HHS-HCC)   Essential hypertension   Type 2 diabetes mellitus without complication, with long-term current use of insulin  (HCC)   Immunosuppressed status (HHS-HCC)   Atrial fibrillation (CMS/HHS-HCC)   ADPKD (autosomal dominant polycystic kidney disease)   Acute on chronic diastolic CHF (congestive heart failure) (CMS/HHS-HCC)   Supraventricular tachycardia, unspecified (HHS-HCC)   On prednisone  therapy   # Volume overload # HFpEF # HTN Presented with  ongoing LE edema and SOB despite increasing home diuretics. PET 09/2023 with normal function. proBNP 40k. Still with mild edema  - Repeat echo reveals EF 30 RVSP 37 - s/p IV furosemide ; trial of torsemide  60mg  daily.   - uptitrate carvedilol  to 37.5mg  Q12H for SVT  - continue empa 10 mg  - consider trialing outpatient lisinopril.  To be discussed in OSH renal txp clinic 12/1.  F/u with [facility] 12/3 - Hold nifedipine given low EF - supplemented k/mg  - discussions with  patient about BALANCE-D trial  #pAFib #SVT SR on telemetry, had couple episodes of SVT 150s, asymptomatic - Continue home apixaban   - uptitrating carvedilol  as above; if not tolerating higher dose, can switch to metoprolol  # Autosomal dominant polycystic kidney disease (ADPKD) # s/p kidney transplant 06/2024 # Immunosuppressed status  Progressive decrease in renal function, has had 2 biopsies of kidney to assess rejection given Cr trend, which were both negative (done in [place]).  - Continue home tacrolimus , last level 11/16 7.6 (goal 5-7) - Continue home MMF and prednisone  - Continue home letermovir  for CMV ppx - Continue home Bactrim  for PJP ppx - pt to f/u with [facility] transplant team 12/1 including management of JP drain  #DM  A1C 5.9 Sept 2025. - AM BGL 180, lunch/dinner/bedtime BGL rising 240s-270s. - increase lantus  from 10->12 units - increase lispro TID from 4->6 units.  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  - Given unilateral edema, LLE US  performed and was negative for DVT   Comorbid Conditions: Nutritional Disorders:    Hypoalbuminemia:    Hypoalbuminemia is associated with increased risk for patients.  We will attempt to treat the underlying condition(s) contributing to this low albumin state. 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  Electrolyte Disorders:    Hypokalemia:   Potassium supplement administered.  Will continue to monitor.   Hematologic Disorders:    Anemia:  Anemia present with lowest hemoglobin of  9.1.  Will continue to monitor.          Code Status: Full Code VTE Prophylaxis:  VTE Prophylaxis  + Anticoagulant Ordered  apixaban , 5 mg, Oral, Q12H, 5 mg at 10/07/24 9185      Discharge Planning: pending clinical course   ELSIE JULIANNA GASMAN, PA-C  ------------------------------------------------------------------------------- Attestation signed by Merline Lares, MD at 10/08/2024  6:13 PM  Attending Attestation:    Randell Statement:   I personally saw the patient and performed a substantive portion of the medical decision making, in conjunction with the Advanced Practice Provider for the condition/treatment of Lea: 68 year old man with a history of ESRD secondary to polycystic kidney disease status post kidney transplant in 2006 and more recently in August 2025, metastatic sarcoma in remission, 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  1.  Acute on chronic HFpEF-patient appears warm and wet on exam.  Jugular veins are distended and he has ascites on exam.  He reports a recent hospitalization  in October for pneumonia and has had persistent fluid gain since that time.  Concomitant with this his renal graft function has worsened.  Kidney biopsies performed in [place]  do not suggest rejection.  Suspect cardiorenal syndrome as a driver for this.  Repeat echocardiogram is pending.  Will escalate diuretics and optimize GDMT based on LVEF.  2.  Polycystic kidney disease status post kidney transplant in August 2025-currently on tacrolimus , MMF and prednisone  as well as prophylaxis for opportunistic infections.  Will consult renal transplant team for guidance on managing his immunomodulators.  3.  Atrial fibrillation/SVT-patient has a history of atrial fibrillation and presented with sinus rhythm initially.  Developed an episode of rapid  narrow complex regular tachycardia, suggestive of AVNRT.  Will continue his apixaban  as prescribed as well as carvedilol .  Will continue to monitor for recurrent events of SVT or A-fib.  Is unclear whether this was the precipitant for his presentation.  CHERON HARBOR, MD  -------------------------------------------------------------------------------

## 2024-10-08 NOTE — Progress Notes (Signed)
 Nephrology Progress Note: Transplant  Interval history   Seen at bedside this morning. Feeling well, breathing well, good UOP on torsemide  60mg . Plan for DC today.   Active Medications   . apixaban   5 mg Oral Q12H  . carvediloL   37.5 mg Oral Q12H SCH  . cyanocobalamin  1,000 mcg Oral Daily  . empagliflozin   10 mg Oral Daily  . insulin  GLARGINE-yfgn  12 Units Subcutaneous Daily  . insulin  LISPRO  6 Units Subcutaneous TID CC  . insulin  LISPRO (AdmeLOG, HumaLOG) injection  0-12 Units Subcutaneous TID CC  . letermovir   480 mg Oral Daily  . magnesium  oxide  1,200 mg Oral TID  . mycophenolate   360 mg Oral Q12H SCH  . pantoprazole   40 mg Oral Daily  . [START ON 10/09/2024] potassium chloride   20 mEq Oral BID CC  . predniSONE   5 mg Oral Daily  . rosuvastatin   10 mg Oral QHS  . sodium phosphate -potassium phosphate  2 tablet Oral BID  . sulfamethoxazole -trimethoprim   1 tablet Oral Daily  . tacrolimus   1.5 mg Oral Daily  . TORsemide   60 mg Oral Daily    Objective   Physical Exam BP (!) 148/78 (BP Location: Right upper arm, Patient Position: Sitting)   Pulse 85   Temp 36.6 C (97.8 F) (Oral)   Resp 18   Ht 175.3 cm (5' 9)   Wt 82.2 kg (181 lb 3.2 oz)   SpO2 96%   BMI 26.76 kg/m   Gen:   NAD CV:   Regular rate  Pulm:   Normal WOB  Abd:   Soft, JP drain in place  MSK:   Mild pedal edema   Labs   Laboratory data: reviewed FK506 (Tacrolimus )  Date Value Ref Range Status  10/05/2024 7.6 5.0 - 20.0 ng/mL Final  10/04/2024 8.5 5.0 - 20.0 ng/mL Final  10/04/2024 8.1 5.0 - 20.0 ng/mL Final  10/03/2024 7.6 5.0 - 20.0 ng/mL Final  10/02/2024 6.9 5.0 - 20.0 ng/mL Final   Radiologic data: reviewed   Assessment and Recommendations   67yo man with a history of ADPKD status post LDKT (2006) and DDKT 07/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 Nephrology was consulted for AKI and IS management.   Post-transplant creatinine nadir is 2.0-2.2 mg/dL. His post-Op course included repeated ATN, hematoma, seroma, drain placement. His AKI this admission may be related to cardiorenal syndrome from new HFrEF and further complicated by compressive seroma. He underwent allograft biopsy on 09/29/24 at OSH that did not have evidence of rejection but did demonstrate ATN. Urinoma and urinary obstruction have been ruled out based on JP drain creatinine levels.    Allograft function  S/P DDKT 06/2024 AKI ATN, biopsy proven Admitted to Cardiology Agree with GDMT and diuresis  Agree with waiting to reinitiate RAASi until outpatient follow-up.  Primary team reached out to his Transplant Center to discuss timing with his primary Transplant Nephrologist Plan for discharge today with torsemide  60mg  daily   Immunosuppression CNI: Envarsus  1.5mg  qdaily FK Goal: 5-7 ng/mL Next FK level: follow-up outpatient   MPA: MMF 360 q12h Steroids: Prednisone  5  Prophylaxis Continue Bactrim  SS for PJP ppx Continue Letermovir  480 mg for CMV ppx  We will follow with you.    Vanetta Anchors, MD Nephrology Fellow  Transplant service pager [phone]  The Nephrology Consult team  is available in-house from 7A-5P. If urgent assistance is needed outside of these hours, please page the Nephrology on-call pager [phone] for assistance. The Nephrology consult pagers are available after business hours for emergencies only.   ------------------------------------------------------------------------------- Attestation signed by [place] Donnice Heinz, MD at 10/08/2024  3:00 PM  Attestation Statement:  If going out, please assure he is taking his weight daily, watching his I/O, has follow up with kidney transplant center in [facility] for on going advice. May need some K supplementation.  I would avoid K sparing diuretics  while we await normalization of his creatinine (don't want anything that might negatively impact kidney perfusion).  I personally saw and evaluated the patient, and participated in the management and treatment plan as documented in the resident/fellow note.  Donnice Heinz [place], MD  -------------------------------------------------------------------------------

## 2024-10-11 NOTE — Consults (Signed)
 Nephrology Consult Note: Transplant  History of Present Illness   Nicholas Glenn is a 68 y.o. 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  History obtained at bedside, felt warm today however no recorded fever. No chills. Has chronic productive cough that has been clear for months - unchanged. No diarrhea or vomiting. No pain or tenderness around the site of the kidney. Feels overall malaise that started today - no symptoms on days prior. Adherent to medications with around 0.5kg weight gain overall. No SOB or CP. Drain from kidney with ~165cc daily that is clear and unchanged. No dysuria or hematuria.   Transplant nephrology is consulted for immunosuppression management.  Notably during his most recent admission, Cr was noted to be higher than his post-op nadir of 2mg /dL. In the range of 2.7-3.1mg /dL which was attributed to cardiorenal syndrome from new HFrEF and further complicated by compressive seroma. He underwent allograft biopsy on 09/29/24 at OSH that did not have evidence of rejection but did demonstrate ATN. Urinoma and urinary obstruction have been ruled out based on JP drain creatinine levels.   Seen by cardiology earlier who note that weight was largely unchanged. He is on room air. He has been adherent to medications - most importantly torsemide . CXR with improving bilateral pleural effusions. Viral panel negative. Pro-BNP down-trending. RFP with normal electrolytes, BUN of 27 and Cr of 3.1. Trend of Cr during his most recent admission of Cr was in the range of 2.7-3.1mg /dL.  He was given IV furosemide  80mg  and planned to be admitted to CEU for observation.   ROS    The review of systems was negative unless otherwise stated in the HPI.   Past Medical History    Patient Active Problem List   Diagnosis Date Noted  . Acute systolic CHF (congestive heart failure) (CMS/HHS-HCC) 10/08/2024  . Supraventricular tachycardia, unspecified (HHS-HCC) 10/03/2024  . On prednisone  therapy 10/03/2024  . Pneumonia of left upper lobe due to infectious organism 08/02/2024  . Elevated PSA, less than 10 ng/ml 04/24/2023  . Screening for colon cancer 01/09/2023  . Pre-procedural examination 12/27/2022  . Actinic keratosis 07/18/2022  . Encounter for removal of sutures 07/18/2022  . Kidney transplant failure (HHS-HCC) 07/18/2022  . Neoplasm of uncertain behavior of skin 07/18/2022  . Nevus, non-neoplastic 07/18/2022  . Renal failure 07/18/2022  . 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 COVID-19 03/23/2022  . 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 Anaphylactic shock, unspecified, initial encounter 07/29/2021  . Heart failure with reduced ejection fraction (CMS/HHS-HCC) 07/23/2021  . Chronic anticoagulation 07/23/2021  . ESRD on hemodialysis (CMS/HHS-HCC) 07/23/2021  . Abdominal pain, right upper quadrant   . Other nonthrombocytopenic purpura 06/15/2021  . Acidosis 04/04/2021  . Hypo-osmolality and hyponatremia 04/04/2021  . Unspecified abnormal findings in urine 03/31/2021  . ESRD (end stage renal disease) on dialysis (CMS/HHS-HCC) 03/19/2021  . Hypertensive chronic kidney disease with stage 1 through stage 4 chronic kidney disease, or unspecified chronic kidney disease 03/16/2021  . Moderate protein-calorie malnutrition (HHS-HCC) 03/16/2021  . Dependence on renal dialysis () 03/14/2021  . Allergy, unspecified, initial encounter 03/09/2021  . Anemia in chronic kidney disease 03/09/2021  . Contact with  and (suspected) exposure to tuberculosis 03/09/2021  . Hyperkalemia 03/09/2021  . Other disorders resulting  from impaired renal tubular function 03/09/2021  . Other specified diseases of liver 03/09/2021  . Secondary hyperparathyroidism of renal origin (HHS-HCC) 03/09/2021  . Chronic systolic CHF (congestive heart failure) (CMS/HHS-HCC) 02/14/2021  . Coronary artery calcification seen on CAT scan 02/14/2021  . Pre-transplant evaluation for kidney transplant 01/10/2021  . Iron  deficiency anemia due to chronic blood loss 12/02/2020  . Skin wound from surgical incision 09/13/2020  . Clostridium difficile infection 09/04/2020  . Atrial fibrillation (CMS/HHS-HCC) 08/05/2020  . Coronary artery disease involving native coronary artery of native heart without angina pectoris 04/08/2020  . Mixed hyperlipidemia 04/08/2020  . Atrial flutter, paroxysmal (CMS/HHS-HCC) 04/08/2020  . CKD (chronic kidney disease) stage 5, GFR less than 15 ml/min (CMS/HHS-HCC) 12/04/2019  . Diverticulitis of large intestine with abscess without bleeding 10/13/2019  . Diverticulitis 10/06/2019  . OSA (obstructive sleep apnea) 01/04/2019  . Hypercalcemia 10/30/2018  . ADPKD (autosomal dominant polycystic kidney disease) 10/21/2018  . Bradycardia 10/21/2018  . Hypertensive heart and kidney disease w/ CKD (chronic kidney disease) 10/21/2018  . Hypokalemia 10/21/2018  . Renal transplant disorder (HHS-HCC) 10/21/2018  . Transient alteration of awareness 10/21/2018  . Congestive heart failure of unknown etiology (CMS/HHS-HCC) 10/18/2018  . Personal history of other malignant neoplasm of skin 05/07/2015  . Diverticulitis of large intestine with perforation without bleeding 04/19/2015  . 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 Secondary sarcoma of left lung (CMS/HHS-HCC)   . Lung nodules 12/24/2013  . Immunosuppressed status (HHS-HCC) 05/22/2013  . Type 2 diabetes mellitus without complication, with long-term current use of insulin  (HCC) 03/20/2013  . Sarcoma of lower extremity 02/27/2013  . PKD  (polycystic kidney disease) 06/11/2012  . Essential hypertension 06/11/2012  . History of melanoma, 1997 06/11/2012  . Squamous cell carcinoma of forehead 11/20/2005  . 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 S/p nephrectomy, bilateral native 05/29/2005    Social History   Socioeconomic History  . Marital status: Married    Spouse name: Xai Frerking  Occupational History  . Occupation: [employer]  . Occupation: Retired CDR in the [employer]  Tobacco Use  . Smoking status: Never    Passive exposure: Never  . Smokeless tobacco: Never  Vaping Use  . Vaping status: Never Used  Substance and Sexual Activity  . Alcohol use: Never  . Drug use: Never  . Sexual activity: Defer    Partners: Female    Birth control/protection: Post-menopausal, None    Comment: Married  Social History Narrative   Patient lives with his wife and 1 cat. Patient is a Environmental Health Practitioner. Wife will be support   Social Drivers of Corporate Investment Banker Strain: Low Risk  (10/06/2024)   Overall Financial Resource Strain (CARDIA)   . Difficulty of Paying Living Expenses: Not hard at all  Food Insecurity: No Food Insecurity (10/06/2024)   Hunger Vital Sign   . Worried About Programme Researcher, Broadcasting/film/video in the Last Year: Never true   . Ran Out of Food in the Last Year: Never true  Transportation Needs: No Transportation Needs (10/06/2024)   PRAPARE - Transportation   . Lack of Transportation (Medical): No   . Lack of Transportation (Non-Medical): No  Physical Activity: Insufficiently Active (06/04/2020)   Exercise Vital Sign   . Days of Exercise per Week: 3 days   . Minutes of Exercise per Session: 30 min  Stress: No Stress Concern Present (06/04/2020)   [employer] of Occupational Health - Occupational Stress Questionnaire   . Feeling of Stress : Not at all  Housing Stability: Low Risk  (10/06/2024)   Housing Stability Vital Sign   . Unable to Pay for Housing in the  Last Year: No   . Number of Times Moved in the Last Year: 0   . Homeless in the Last Year: No    Family History  Problem Relation Name Age of Onset  . Cataracts Mother    . Dementia Mother    . Kidney disease Father Jin Capote   . High blood pressure (Hypertension) Father Victoria Euceda        No comment  . Polycystic kidney disease Father Valeria Krisko   . ESRD-Transplant Father Thinh Cuccaro   . Kidney cancer Brother Quoc Tome        No comment  . ESRD-Transplant Brother Ilya Neely   . Cancer Brother Sheamus Hasting        Kidney cancer  . Colon cancer Maternal Grandfather Evertt Sherill        No comment  . Polycystic kidney disease Son    . Anesthesia problems Neg Hx    . Malignant hypertension Neg Hx    . Malignant hyperthermia Neg Hx    . Pseudochol deficiency Neg Hx    . PONV Neg Hx    . Blindness Neg Hx    . Diabetes type I Neg Hx    . Glaucoma Neg Hx    . Macular degeneration Neg Hx      Allergies   Allergies  Allergen Reactions  . Venom-Honey Bee Swelling    Home Medications   Current Outpatient Medications  Medication Instructions  . apixaban  (ELIQUIS ) 5 mg, Oral, Every 12 hours, Resume taking on 2/23  . blood glucose diagnostic test strip Use as instructed.to test blood sugars four  times daily e11.9 freestyle lite  test strips  . blood-glucose sensor (DEXCOM G7 SENSOR) Devi 1 each, Every 10 days  . blood-glucose,receiver,cont (DEXCOM G7 RECEIVER) Misc Use  . carvediloL  (COREG ) 37.5 mg, Oral, Every 12 hours  . cyanocobalamin (VITAMIN B12) 1,000 mcg, Oral, Daily  . doxercalciferoL  (HECTOROL ) 0.5 mcg, Oral, Daily  . empagliflozin  (JARDIANCE ) 10 mg, Oral, Daily  . insulin  LISPRO (HUMALOG KWIKPEN INSULIN ) pen injector (concentration 100 units/mL) 4-6 units TID AC plus SSI prn ( up to 70 units per day) 1 unit per 5 gm carb  . lancets 28 gauge Misc 1 each, Subcutaneous, 4 times Daily  . LANTUS  SOLOSTAR U-100 INSULIN  15 Units, Subcutaneous, Daily  .  magnesium  oxide (MAG-OX) 400 mg (241.3 mg magnesium ) tablet 3 tablets, 3 times Daily  . mycophenolate  (MYFORTIC ) 180 MG DR tablet Takes two tablets twice a day  . pantoprazole  (PROTONIX ) 40 mg, Every morning  . pen needle, diabetic (BD ULTRA-FINE NANO PEN NEEDLE) 32 gauge x 5/32 Ndle 1 each, Subcutaneous, 5 times Daily  . potassium chloride  (KLOR-CON  M20) 20 MEQ ER tablet 20 mEq, Oral, 2 times Daily with meals  . predniSONE  (DELTASONE ) 5 mg, Every morning  . PREVYMIS  480 mg, Daily  . rosuvastatin  (CRESTOR ) 10 mg, Oral, Nightly  . sulfamethoxazole -trimethoprim  (BACTRIM  SS) 400-80 mg tablet 1 tablet, Daily  . tacrolimus  (ENVARSUS  XR) 1.5 mg, Daily  . TORsemide  (DEMADEX ) 60 mg, Oral, Daily  . WES-PHOS 250 NEUTRAL 250 mg tablet 2 tablets, 2 times Daily     Active Medications   . loratadine  20 mg Oral Daily    Objective    Current Vital Signs 24h Vital Sign Ranges  T 36.3 C (97.3 F) Temp  Avg: 36.8 C (98.3 F)  Min: 36.3 C (97.3 F)  Max: 37.4 C (99.3 F)  BP 120/59 BP  Min: 95/54  Max: 128/66  HR 85 Pulse  Avg: 81.7  Min: 79  Max: 85  RR 15 Resp  Avg: 17.6  Min: 11  Max: 26  SaO2 95 %   SpO2  Avg: 96.1 %  Min: 93 %  Max: 98 %          Ins & Outs I/O last 2 completed shifts: In: -  Out: 575 [Urine:575] Current Shift:  No intake/output data recorded.   Physical Exam Gen:   NAD CV:   RRR Pulm:   normal WOB, CTAB, diminished at the bases Abd:   soft, NTND MSK:   Chronic L 2+ edema of lower extremity, R with trace pedal edema   Labs   Recent Labs  Lab 10/07/24 0706 10/09/24 1049 10/11/24 1356  WBC 9.4 10.0* 9.7  HGB 9.1* 9.1* 8.4*  PLT 261 205 180   Recent Labs  Lab 10/08/24 0722 10/09/24 1049 10/11/24 1356  NA 141 142 139  137  K 3.3* 4.0 3.8  CL 105 104 104  CO2 26 28 25   BUN 30* 28* 27*  CREATININE 2.8* 2.8* 3.1*  CALCIUM  8.9 9.4 9.0  ALKPHOS  --   --  67  GLUCOSE 181* 167* 115   Lab Results  Component Value Date   PTH 384 (H) 04/04/2024    CALCIUM  9.0 10/11/2024   PHOS 3.1 10/09/2024   Lab Results  Component Value Date   IRON  30 (L) 10/03/2024   TIBC 206 (L) 10/03/2024   FERRITIN 994 (H) 10/03/2024   PCTSAT 15 10/03/2024    FK506 (Tacrolimus )  Date Value Ref Range Status  10/09/2024 6.9 5.0 - 20.0 ng/mL Final  10/05/2024 7.6 5.0 - 20.0 ng/mL Final  10/04/2024 8.5 5.0 - 20.0 ng/mL Final    Recent Labs  Lab 10/09/24 1049  COLORU Light Yellow  CLARITYU Clear  SPECGRAV 1.012  LABPH 7.0  PROTEINUA 1+*  GLUCOSEU 4+*  KETONESU Negative  BLOODU 1+*  NITRITE Negative  LEUKOCYTESUR Negative  BILIRUBINUR Negative  UROBILINOGEN 0.2  RBCUA 2  WBCUA 1  SQUAMEPI 0  HYALINE 0    Assessment and Recommendations  67yo man with a history of ADPKD status post LDKT (2006) and DDKT 07/10/2024 (CMV +/-, EBV +/-, Thymo induction) ([place] ), bilateral native nephrectomies, metastatic sarcoma in remission, CHF 2/2 anthracycline-related CM and recent hospitalization for HF exacerbation w/ newly reduced EF representing with malaise.  Nephrology consulted for IS management.  Post-transplant creatinine nadir is 2.0-2.2 mg/dL. His post-Op course included repeated ATN, hematoma, seroma, drain placement. AKI during most recent admission attributed to cardiorenal syndrome from new HFrEF and further complicated by compressive seroma. He underwent allograft biopsy on 09/29/24 at OSH that did not have evidence of rejection but did demonstrate ATN. Urinoma and urinary obstruction have been ruled out based on JP drain creatinine levels.   Cr has remained around the same for the past month - roughly 2.7-3.1mg /dL. Cardiorenal remains the most likely differential, with recent biopsy and no change in IS, rejection seems less likely. Additionally he has a compressive seroma - repeat kidney US  pending. Tacrolimus  levels have been therapeutic.  Urine microscopy with 1 granular cast, few non-dysmorphic RBCs  Allograft  function AKI S/p DDKT  06/2024 Agree with diuresis, cardiology following - s/p 80mg  of IV furosemide , can probably resume oral dose Would continue to hold ACE/ARB Check urinalysis: glucosuria, trace blood, negative protein/nitrite,1RBC and <1WBC Monitor PVR Renal US  of transplanted kidney pending Check CMV DNA Follow blood cultures  Immunosuppression FK506 (Tacrolimus )  Date Value Ref Range Status  10/09/2024 6.9 5.0 - 20.0 ng/mL Final  10/05/2024 7.6 5.0 - 20.0 ng/mL Final  10/04/2024 8.5 5.0 - 20.0 ng/mL Final   CNI: Envarsus  1.5mg  daily FK Goal: 5-7ng/mL - has been in therapeutic range Next FK level: Please check tomorrow in the AM ~30 mins prior to next due tdose MPA: MMF 360mg  q12H Steroids: Prednisone  5mg  Please resume all immunosuppression  Prophylaxis Continue Bactrim  SS for PJP ppx Continue Letermovir  480 mg for CMV ppx  Thank you for this consult. We will continue to follow the patient with you.  Please feel free to call with questions or concerns.   PENNELOPE ABIDE, MD Nephrology Fellow Transplant service pager [phone]  The Nephrology Consult team is available in-house from 7A-5P. If urgent assistance is needed outside of these hours, please page the Nephrology on-call pager [phone] for assistance. The Nephrology consult pagers are available after business hours for emergencies only.    ------------------------------------------------------------------------------- Attestation signed by Alto Donnice Heinz, MD at 10/12/2024 11:44 AM  Attestation Statement:   I personally saw and evaluated the patient, and participated in the management and treatment plan as documented in the resident/fellow note.  Donnice Heinz [place], MD  -------------------------------------------------------------------------------

## 2024-10-11 NOTE — ED Provider Notes (Signed)
 10:58 PM Assumed care of patient from off-going team. For more details, please see note from same day.  Nicholas Glenn is a 68 y.o. 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  States that he felt overall well at time of discharge on 11/19 and over the last few days, however, felt unwell this morning.  Reports dyspnea with exertion as well as orthopnea.  Reports compliance with daily torsemide  60 mg.  Weighs himself daily and has had 0.5 kg weight gain over the last few days.  Reports lower extremity swelling is at baseline.  Denies chest pain or palpitations.  Endorses chronic dry cough associated with congestion and sinus pressure.  Felt warm by the fire earlier today, though did not have a documented fever.  Reports normal urine output.  S/p cardiology consult, no admit-able diagnosis, CEU was not comfortable with complex PMH  Initial vital signs: BP 121/66   Pulse 85   Temp 36.7 C (98.1 F) (Tympanic)   Resp 18   SpO2 97%   Initial work-up significant for:  Labs Reviewed  COMPLETE BLOOD COUNT (CBC) WITH DIFFERENTIAL - Abnormal; Notable for the following components:      Result Value   Hemoglobin 8.4 (*)    Hematocrit 27.0 (*)    MCHC (Mean Corpuscular Hemoglobin Concentration) 31.1 (*)    RBC (Red Blood Cell Count) 3.12 (*)    RDW-CV (Red Cell Distribution Width) 18.0 (*)    Neutrophil % 83.9 (*)    Lymphocyte Count 0.4 (*)    Lymphocyte % 3.7 (*)    Monocyte Count 1.1 (*)    All other components within normal limits  COMPREHENSIVE METABOLIC PANEL (CMP) - Abnormal; Notable for the following components:   Urea Nitrogen (BUN) 27 (*)     Creatinine 3.1 (*)    AST (Aspartate Aminotransferase) 13 (*)    ALT (Alanine Aminotransferase) 10 (*)    Albumin 3.0 (*)    Protein, Total 6.0 (*)    All other components within normal limits  PRO-BRAIN NATRIURETIC PEPTIDE, N-TERMINAL (NT-PRO-BNP) - Abnormal; Notable for the following components:   Pro-Brain Natriuretic Peptide, N-terminal (NT-Pro-BNP) 29,413 (*)    All other components within normal limits  PROTHROMBIN  TIME (INR) - Abnormal; Notable for the following components:   Prothrombin  Time 18.8 (*)    Prothrombin  INR 1.6 (*)    All other components within normal limits  SHOCK PANEL, VENOUS - Abnormal; Notable for the following components:   Hemoglobin, Venous 9.0 (*)    Hematocrit, Venous 27.0 (*)    % Methemoglobin, Venous 0.3 (*)    All other components within normal limits  CULTURE, URINE, ROUTINE WITH PYURIA SCREEN (REFLEXED FROM URINALYSIS) - Abnormal; Notable for the following components:   Glucose, Urinalysis 4+ (*)    Blood, Urinalysis Trace (*)    All other components within normal limits   Narrative:    Reporting of microscopic urinalysis (UA) bacteria and yeast results was discontinued on August 30th, 2021 due to poor clinical predictive value for urinary tract infection (UTI). Providers should use other clinical signs and symptoms to diagnose UTI including urine leukocyte esterase, white blood cell count, and culture  in accordance with local and national guidelines. Additional information and rationale can obtained by emailing [email or web address].  RESPIRATORY PATHOGEN EXTENDED PANEL, PCR - Normal   Narrative:    Test performed by multiplex PCR  MAGNESIUM  - Normal  ACTIVATED PARTIAL THROMBOPLASTIN TIME (APTT) - Normal  CULTURE, BLOOD  CULTURE, BLOOD  CYTOMEGALOVIRUS (CMV), PCR, QUANTITATIVE, PLASMA  TROPONIN I, HIGH SENSITIVITY ED EVALUATION  TROPONIN I, HIGH SENSITIVITY ONE HOUR (LO)  TROPONIN I, HIGH SENSITIVITY THREE HOUR (LO)    Outstanding  studies / consultations include: - transplant ultrasound  - Final nephrology recommendations   Plan/Dispo at time of sign-out: pending further evaluation. Anticipate wither CEU vs discharge  ED Course  ED Course as of 10/12/24 0516  Sat Oct 11, 2024  1409 Hemoglobin(!): 8.4 Baseline 8-9, denies bleeding sources [AC]  1409 WBC: 9.7 Similar to recent labs with WBC ~9s [AC]  1412 Lactate, Venous: 1.4 [AC]  1412 pH, Venous: 7.42 [AC]  1412 PCO2, Venous: 40 [AC]  1412 Bicarbonate, Venous: 26 [AC]  1448 Magnesium : 1.8 [AC]  1448 hsTnI Baseline: 42 [AC]  1448 Creatinine(!): 3.1 Baseline 2.8-3.1 [AC]  1448 Potassium: 3.8 [AC]  1448 ECG 12-lead Personal interpretation: NSR 78 bpm with PVCs, ST depression in V5-V6, lead I and II with TWI, similar to prior [AC]  Sun Oct 12, 2024  0039 US  renal transplant complete Interval resolution of mild pelviectasis. No hydronephrosis.   Increasing peak systolic velocities at the main renal artery anastomosis measuring 220 cm/s, previously 185 cm/s, and previously 59.5 cm/s before that. Parenchymal resistive indices within normal limits ranging from 0.64-0.76.   Similar size of complex peritransplant collection measuring up to 5.9 cm. Small volume free fluid redemonstrated.   [TP]  [id number] Nephrology paged for final recs [TP]  [phone] Spoke with nephrology who explained that they mainly wanted the ultrasound to follow-up on the fluid previously visualized around his kidney which is stable.  They do not think there is any admittable diagnosis.  They recommend following outpatient; He has an appointment early December. They recommended collecting a CMV.  Patient structured to follow-up results on their MyChart [TP]    ED Course User Index [AC] Bluford Honour, [place] [TP] Arnell Rosebush, MD     Dispo: Pt remained stable during their evaluation in the ED and thought safe for discharge   ------------------------------- Rosebush Arnell, MD Emergency Medicine,  PGY-2     Arnell Rosebush, MD Resident 10/12/24 [phone]

## 2024-10-11 NOTE — Progress Notes (Signed)
 I performed a history and physical examination of Nicholas Glenn as documented in the resident/fellow/APP note and discussed his management with:  Treatment Team:  Attending Provider: Atha Latisha Pa, MD Physician Assistant: Nicholas Honour, Glenn Registered Nurse: Nicholas Gibbs, RN   I agree with the history, physical, assessment, and plan of care, with the following exceptions: None  Attestation Statement:   I personally saw the patient and performed a substantive portion of this encounter, including a complete performance of at least one of the key components (MDM, Hx and/or Exam), in conjunction with the Advanced Practice Provider for care.   MDM: 68 y.o. male with past medical history of polycystic kidney disease s/p LDKT (2006) and DDKT (Aug 2025), history of metastatic sarcoma in remission, HFrEF (EF 30% 09/2024), SVT, AF, LUE DVT (on Eliquis ), recent admission for CHF, presents with gen weakness and ShOB, with DOE. Wife noticed increased WOB. LE swelling at baseline. No substantial increase in weight. Chronic cough. No sick contacts.  History of Present Illness Nicholas Glenn is a 68 year old male with heart failure who presents with shortness of breath and lethargy.  He experiences significant shortness of breath, particularly when walking, and describes having 'no energy' and feeling lethargic. He was recently discharged from the hospital on Wednesday and was doing fine until today, when he noticed a change in his condition. No chest pain. He has been coughing, but it has not worsened recently. He reports a slight weight gain of about half a kilo since Thursday morning.  He has a history of heart failure with an ejection fraction of 30%. No fever, chills, belly pain, vomiting, or diarrhea. However, an accompanying person noted that he felt hot and was breathing rapidly while sleeping, with respirations timed at 30 per minute. He was also described as being very lethargic upon waking  to take his medications.  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 Since then, his left leg has remained swollen and has not recovered.  Assessment & Plan Heart failure with reduced ejection fraction Ejection fraction at 30%. Recent discharge with increased dyspnea, lethargy, and weakness. Elevated respiratory rate during sleep. Slight weight gain noted. C/f heart failure.  - Ordered blood work to assess kidney function and other parameters. - Ordered chest X-ray to evaluate for pneumonia. - Coordinated with transplant team for further management.  Dyspnea and cough Persistent dyspnea and cough, unchanged in severity, exacerbated by activity.  - Ordered chest X-ray to evaluate for pneumonia.  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 Unchanged from baseline. Do not believe DVT.  Plan: Orders Placed This Encounter  Procedures  . Culture, Blood  . Culture, Blood  . Respiratory Pathogen, Extended Panel, PCR  . X-ray chest Glenn and lateral  . Complete Blood Count (CBC) with Differential  . Comprehensive Metabolic Panel (CMP)  . Magnesium   . Pro-Brain Natriuretic peptide, N-Terminal (NT-pro-BNP)  . Troponin I, High Sensitivity ED Evaluation (hsTnI)  . Activated Partial Thromboplastin Time (APTT)  . Prothrombin  Time (INR)  . Shock Panel, Venous  . Troponin I, High Sensitivity One Hour (hsTnI)  . ED Cardiac Monitoring  . Pulse Oximetry  . Contact isolation status  . Droplet isolation status  . ECG 12-lead  . ECG 12-lead  . ECG 12-lead     Portions of this record have been created using Dragon (  a voice recognition system) dictation software. Dictation errors have been sought, but may contain typographical errors missed during proofreading.   Nicholas Glenn

## 2024-10-11 NOTE — ED Notes (Signed)
 Pt to ED c/o generalized weakness &  SOB. Recent kidney transplant Aug 2025. New HF dx, d/c last week. Drain placed Tuesday to drain around new kidney, per wife. Denies recent fevers or changes to drain output. Reports baseline LLE more edematous compared to RLE. Placed on CCM & pulse ox. Respirations even, unlabored, CE symmetrical. Bed locked & in lowest position, call bell within reach.

## 2024-10-11 NOTE — ED Notes (Signed)
 Received endorsement from previous RN and assumed care of this patient. Patient chief complaint is SOB. Pt is A&Ox4, saturating 93% on RA. Greeted and patient identification verification done correctly. Attached to cardiac monitor. Bed in lowest position and keep side rails up. Placed call bell within reach and instructions given accordingly.

## 2024-10-11 NOTE — Progress Notes (Signed)
 ASSIGN TO CEU  At this time, after risk stratification, the patient's medical condition requires further evaluation and treatment beyond the initial care provided in the emergency department. I reviewed and discussed the medical treatment, findings, interventions, and diagnostic testing and plan with the ED and CEU treatment teams. The patient is appropriate for observation in the CEU for Heart Failure (HF). Discussed the patient's condition and plan of care with the family.

## 2024-10-11 NOTE — Consults (Signed)
 [facility]  Division of Cardiovascular Medicine Cardiology Consult Service    Consult Note   Date of Service: 10/11/2024     Time: 6:20 PM  Consulting Service: No service for patient encounter.  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  Recent admission returns with weight gain, ongoing cough, generalized weakness and shortness of breath.  Briefly, he was recently admitted with ongoing lower extremity edema and dyspnea despite increasing home diuretics.  He had a proBNP of 40,000 at that time.  And a TEE showed an EF down to 30% he was diuresed with IV Lasix  and then titrated onto torsemide .  His GDMT was adjusted and he was discharged about a couple days ago.  He presents with feeling poorly at home.  He had a mild increase in his weight of 0.15 kg.  He states that he felt some warmth that sometimes he felt when he was having his SVTs but his heart rate did not change.  He has been taking his torsemide  as prescribed on discharge and has been largely paying the same amount as he was in the hospital, his wife takes copious notes and states that maybe he had a small drop-off today.  He states that he has been having some longstanding congestion and a cough for the last couple months and it has not really improved over time.  He states that he was feeling pretty good the first 2 days after discharge but today he just felt a little off and has been worried about his current course and trajectory.  No sick contacts.  No fevers at home.  SVT during the last admission which were asymptomatic and his carvedilol  was titrated so that  he could have better heart rate control which occurred.  He had a Holter monitor in July with less than 1% ventricular ectopy.  He currently has not had any alarms on telemetry up until my examination.     Past History   Patient Active Problem List   Diagnosis Date Noted  . Acute systolic CHF (congestive heart failure) (CMS/HHS-HCC) 10/08/2024  . Supraventricular tachycardia, unspecified (HHS-HCC) 10/03/2024  . On prednisone  therapy 10/03/2024  . Pneumonia of left upper lobe due to infectious organism 08/02/2024  . Elevated PSA, less than 10 ng/ml 04/24/2023  . Screening for colon cancer 01/09/2023  . Pre-procedural examination 12/27/2022  . Actinic keratosis 07/18/2022  . Encounter for removal of sutures 07/18/2022  . Kidney transplant failure (HHS-HCC) 07/18/2022  . Neoplasm of uncertain behavior of skin 07/18/2022  . Nevus, non-neoplastic 07/18/2022  . Renal failure 07/18/2022  . 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 COVID-19 03/23/2022  . 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 Anaphylactic shock, unspecified, initial encounter 07/29/2021  . Heart failure with reduced ejection fraction (CMS/HHS-HCC) 07/23/2021  . Chronic anticoagulation 07/23/2021  . ESRD on hemodialysis (CMS/HHS-HCC) 07/23/2021  . Abdominal pain, right upper quadrant   . Other nonthrombocytopenic purpura 06/15/2021  . Acidosis 04/04/2021  . Hypo-osmolality and hyponatremia 04/04/2021  . Unspecified abnormal findings in urine 03/31/2021  . ESRD (end stage renal disease) on dialysis (CMS/HHS-HCC) 03/19/2021  .  Hypertensive chronic kidney disease with stage 1 through stage 4 chronic kidney disease, or unspecified chronic kidney disease 03/16/2021  . Moderate protein-calorie malnutrition (HHS-HCC) 03/16/2021  . Dependence on renal dialysis () 03/14/2021  . Allergy, unspecified, initial encounter 03/09/2021  . Anemia in chronic kidney disease 03/09/2021  .  Contact with and (suspected) exposure to tuberculosis 03/09/2021  . Hyperkalemia 03/09/2021  . Other disorders resulting from impaired renal tubular function 03/09/2021  . Other specified diseases of liver 03/09/2021  . Secondary hyperparathyroidism of renal origin (HHS-HCC) 03/09/2021  . Chronic systolic CHF (congestive heart failure) (CMS/HHS-HCC) 02/14/2021  . Coronary artery calcification seen on CAT scan 02/14/2021  . Pre-transplant evaluation for kidney transplant 01/10/2021  . Iron  deficiency anemia due to chronic blood loss 12/02/2020  . Skin wound from surgical incision 09/13/2020  . Clostridium difficile infection 09/04/2020  . Atrial fibrillation (CMS/HHS-HCC) 08/05/2020  . Coronary artery disease involving native coronary artery of native heart without angina pectoris 04/08/2020  . Mixed hyperlipidemia 04/08/2020  . Atrial flutter, paroxysmal (CMS/HHS-HCC) 04/08/2020  . CKD (chronic kidney disease) stage 5, GFR less than 15 ml/min (CMS/HHS-HCC) 12/04/2019  . Diverticulitis of large intestine with abscess without bleeding 10/13/2019  . Diverticulitis 10/06/2019  . OSA (obstructive sleep apnea) 01/04/2019  . Hypercalcemia 10/30/2018  . ADPKD (autosomal dominant polycystic kidney disease) 10/21/2018  . Bradycardia 10/21/2018  . Hypertensive heart and kidney disease w/ CKD (chronic kidney disease) 10/21/2018  . Hypokalemia 10/21/2018  . Renal transplant disorder (HHS-HCC) 10/21/2018  . Transient alteration of awareness 10/21/2018  . Congestive heart failure of unknown etiology (CMS/HHS-HCC) 10/18/2018  . Personal history of other malignant neoplasm of skin 05/07/2015  . Diverticulitis of large intestine with perforation without bleeding 04/19/2015  . 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 Secondary sarcoma of left lung (CMS/HHS-HCC)   . Lung nodules 12/24/2013  . Immunosuppressed status (HHS-HCC) 05/22/2013  . Type 2 diabetes mellitus  without complication, with long-term current use of insulin  (HCC) 03/20/2013  . Sarcoma of lower extremity 02/27/2013  . PKD (polycystic kidney disease) 06/11/2012  . Essential hypertension 06/11/2012  . History of melanoma, 1997 06/11/2012  . Squamous cell carcinoma of forehead 11/20/2005  . 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 S/p nephrectomy, bilateral native 05/29/2005   Past Medical History:  Diagnosis Date  . Acute GI bleeding 07/23/2021  . Acute on chronic diastolic CHF (congestive heart failure) (CMS/HHS-HCC) 08/29/2024  . Anemia in chronic kidney disease 03/09/2021  . Basal cell carcinoma 05/07/2015   left ear, s/p Mohs surgery  . 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 Coronary artery disease involving native coronary artery of native heart without angina pectoris 04/08/2020  . Diabetes (CMS/HHS-HCC)   . Diverticulitis 02/15/18  . History of melanoma    October and December 2014  . History of melanoma 03/2015   left neck behind the ear.   SABRA History of melanoma, 1997 06/11/2012   back. 1997x 2, 2013, 2014  . Hypertension 06/11/2012  . 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 Motion sickness   . OSA (obstructive sleep apnea) 01/04/2019  . Other nonthrombocytopenic purpura 06/15/2021  . Other specified diseases of liver 03/09/2021  . Other specified diseases of liver 03/09/2021  . PKD (polycystic kidney disease) 06/11/2012  . S/p nephrectomy, bilateral native 05/29/2005  . Sleep apnea   . Snores   .  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 Transient alteration of awareness 10/21/2018  . Volume overload 03/14/2021    Past Surgical History:  Procedure Laterality Date  . NEPHRECTOMY Bilateral 05/2005  . TRANSPLANT KIDNEY Left 07/2005  . EXCISION TUMOR THIGH/KNEE RADICAL Left 02/28/2013    Procedure: RAD RESECTION TUMOR SOFT TISSUE THIGH/KNEE 5+CM;  Surgeon: Redell FORBES Osmond, MD;  Location: [facility] OR;  Service: Orthopedics;  Laterality: Left;  . EXPLORATION POPLITEAL ARTERY Left 02/28/2013   Procedure: EXPLORATION POPLITEAL ARTERY;  Surgeon: Redell FORBES Osmond, MD;  Location: [facility] OR;  Service: Orthopedics;  Laterality: Left;  . Left VATS wedge resection  08/17/2015  . SIGMOIDOSCOPY FLEXIBLE N/A 03/19/2017   Procedure: Flexible Sigmoidoscopy;  Surgeon: Alm Veria Barcelona, MD;  Location: [facility];  Service: Gastroenterology;  Laterality: N/A;  . COLONOSCOPY N/A 09/12/2019   Procedure: COLORECTAL CANCER SCREENING; COLONOSCOPY ON INDIVIDUAL AT HIGH RISK;  Surgeon: Merle Norleen Ricardo Mickey., MD;  Location: [facility];  Service: Gastroenterology;  Laterality: N/A;  . COLONOSCOPY W/BIOPSY N/A 07/15/2020   Procedure: COLONOSCOPY, FLEXIBLE; DIAGNOSTIC;  Surgeon: Willma Morene Shad, MD;  Location: [facility];  Service: Gastroenterology;  Laterality: N/A;  . LAPAROSCOPIC COLECTOMY PARTIAL W/ANASTAMOSIS N/A 08/04/2020   Procedure: Laparoscopic, possible open, sigmoid colectomy;  Surgeon: Florie Lonni Centers, MD;  Location: [facility] OR;  Service: General Surgery;  Laterality: N/A;  . LYSIS ADHESIONS N/A 08/04/2020   Procedure: ENTEROLYSIS (FREEING OF INTESTINAL ADHESION) (SEPARATE PROCEDURE);  Surgeon: Florie Lonni Centers, MD;  Location[facility] OR;  Service: General Surgery;  Laterality: N/A;  . MOBILIZATION SPLENIC FLEXURE W/COLECTOMY PARTIAL N/A 08/04/2020   Procedure: MOBILIZATION (TAKE-DOWN) OF SPLENIC FLEXURE PERFORMED IN CONJUNCTION WITH PARTIAL COLECTOMY (LIST IN ADDITION TO PRIMARY PROCEDURE);  Surgeon: Florie Lonni Centers, MD;  Location: [facility] OR;  Service: General Surgery;  Laterality: N/A;  . PROCTOSIGMOIDOSCOPY RIGID N/A 08/04/2020   Procedure: PROCTOSIGMOIDOSCOPY, RIGID; DIAGNOSTIC, WITH OR WITHOUT  COLLECTION OF SPECIMEN(S) BY BRUSHING OR WASHING (SEPARATE PROCEDURE);  Surgeon: Florie Lonni Centers, MD;  Location: [facility] OR;  Service: General Surgery;  Laterality: N/A;  . APPLICATION WOUND VAC N/A 08/04/2020   Procedure: NEGATIVE PRESSURE WOUND THERAPY, INCLUDING TOPICAL APPLICATION(S), ASSESSMENT, INSTRUCTION(S) FOR ONGOING CARE, PER SESSION; TOTAL WOUND(S) SURFACE AREA LESS THAN OR EQUAL TO 50 SQUARE CENTIMETERS;  Surgeon: Florie Lonni Centers, MD;  Location: [facility] OR;  Service: General Surgery;  Laterality: N/A;  . CYSTOURETHROSCOPY W/URETERAL CATHIZATION W/WO RETROGRADE PYELOGRAM Left 08/04/2020   Procedure: CYSTOURETHROSCOPY, WITH URETERAL CATHETERIZATION, WITH OR WITHOUT IRRIGATION, INSTILLATION, OR URETEROPYELOGRAPHY, EXCLUSIVE OF RADIOLOGIC SERVICE;  Surgeon: Elda Ozell Locus, MD;  Location: [facility] OR;  Service: Urology;  Laterality: Left;  . URETEROGRAM RETROGRADE W/WO KUB N/A 08/04/2020   Procedure: UROGRAPHY, RETROGRADE, WITH OR WITHOUT KUB;  Surgeon: Elda Ozell Locus, MD;  Location: [facility] OR;  Service: Urology;  Laterality: N/A;  . OPEN ANASTAMOSIS ARTERIOVENOUS BY UPPER ARM VEIN TRANSPOSITION Left 01/17/2021   Procedure: ARTERIOVENOUS ANASTOMOSIS, LEFT BASILIC VEIN TRANSPOSITION;  Surgeon: Darra Dozier Blunt, MD;  Location: [facility] OR;  Service: General Surgery;  Laterality: Left;  . THORACOSCOPY WITH THERAPEUTIC WEDGE RESECTION INITIAL UNILATERAL Left 03/01/2021   Procedure: THORACOSCOPY, SURGICAL; WITH THERAPEUTIC WEDGE RESECTION (EG, MASS, NODULE), INITIAL UNILATERAL;  Surgeon: Valli Dickey Fitch, MD;  Location: [facility];  Service: Cardiothoracic;  Laterality: Left;  . OPEN ANASTAMOSIS ARTERIOVENOUS BY UPPER ARM VEIN TRANSPOSITION Left 03/28/2021   Procedure: ARTERIOVENOUS ANASTOMOSIS, OPEN; BY UPPER ARM BASILIC VEIN  TRANSPOSITION;  Surgeon: Darra Dozier Blunt, MD;  Location: [facility] OR;  Service: General  Surgery;  Laterality: Left;  . ENDOSCOPIC RETROGRADE CHOLANGIO-PANCREATOGRAPHY W/SPHINCTEROTOMY N/A 06/30/2021   Procedure: ENDOSCOPIC RETROGRADE CHOLANGIOPANCREATOGRAPHY (ERCP); WITH SPHINCTEROTOMY/PAPILLOTOMY;  Surgeon: Alvan Gwendlyn Bars, MD;  Location: [facility];  Service: Gastroenterology;  Laterality: N/A;  . ENDOSCOPY OF BILIARY DUCT  06/30/2021   Procedure: ENDOSCOPIC CATHETERIZATION OF THE BILIARY DUCTAL SYSTEM, RADIOLOGICAL SUPERVISION AND INTERPRETATION;  Surgeon: Alvan Gwendlyn Bars, MD;  Location: [facility];  Service: Gastroenterology;;  . ENDOSCOPIC RETROGRADE CHOLANGIO-PANCREATOGRAPHY W/EXTRACTION STONE N/A 06/30/2021   Procedure: ENDOSCOPIC RETROGRADE CHOLANGIOPANCREATOGRAPHY (ERCP); WITH REMOVAL OF CALCULI/DEBRIS FROM BILIARY/PANCREATIC DUCT(S);  Surgeon: Alvan Gwendlyn Bars, MD;  Location: [facility];  Service: Gastroenterology;  Laterality: N/A;  . ENDOSCOPIC RETROGRADE CHOLANGIO-PANCREATOGRAPHY W/BIOPSY N/A 06/30/2021   Procedure: ENDOSCOPIC RETROGRADE CHOLANGIOPANCREATOGRAPHY (ERCP); WITH BIOPSY, SINGLE OR MULTIPLE;  Surgeon: Alvan Gwendlyn Bars, MD;  Location: [facility];  Service: Gastroenterology;  Laterality: N/A;  . EXCISION TUMOR THIGH/KNEE RADICAL Left 02/20/2022   Procedure: RADICAL RESECTION OF TUMOR (EG, SARCOMA), SOFT TISSUE OF THIGH OR KNEE AREA; LESS THAN 5 CM;  Surgeon: Dain Redell BRAVO, MD;  Location: [facility] OR;  Service: Orthopedics;  Laterality: Left;  . COLONOSCOPY W/REMOVAL LESIONS BY SNARE N/A 01/10/2023   Procedure: COLONOSCOPY, FLEXIBLE; WITH REMOVAL OF TUMOR(S), POLYP(S), OR OTHER LESION(S) BY SNARE TECHNIQUE;  Surgeon: Orval Elsie Ned, MD;  Location: [facility];  Service: Gastroenterology;  Laterality: N/A;  . COLONOSCOPY W/BIOPSY N/A 01/10/2023   Procedure: COLONOSCOPY, FLEXIBLE; WITH BIOPSY, SINGLE OR MULTIPLE;  Surgeon: Orval Elsie Ned, MD;  Location: [facility];  Service: Gastroenterology;  Laterality: N/A;  . MOHS SURGERY  04/11/2023  . BRONCHOSCOPY Bilateral 08/05/2024   Procedure: BRONCHOSCOPY, RIGID OR FLEXIBLE, INCLUDING FLUOROSCOPIC GUIDANCE, WHEN PERFORMED; WITH BRONCHIAL ALVEOLAR LAVAGE;  Surgeon: Keturah Quan, MD;  Location: [facility];  Service: Pulmonary;  Laterality: Bilateral;  . HERNIA REPAIR Left    inguinal    Allergies  Allergen Reactions  . Venom-Honey Bee Swelling    Social History   Tobacco Use  . Smoking status: Never    Passive exposure: Never  . Smokeless tobacco: Never  Substance Use Topics  . Alcohol use: Never    Family History  Problem Relation Name Age of Onset  . Cataracts Mother    . Dementia Mother    . Kidney disease Father Shriyans Kuenzi   . High blood pressure (Hypertension) Father Tomi Paddock        No comment  . Polycystic kidney disease Father Juel Bellerose   . ESRD-Transplant Father Sol Englert   . Kidney cancer Brother Braun Rocca        No comment  . ESRD-Transplant Brother Tashawn Laswell   . Cancer Brother Nafis Farnan        Kidney cancer  . Colon cancer Maternal Grandfather Evertt Sherill        No comment  . Polycystic kidney disease Son    . Anesthesia problems Neg Hx    . Malignant hypertension Neg Hx    . Malignant hyperthermia Neg Hx    . Pseudochol deficiency Neg Hx    . PONV Neg Hx    . Blindness Neg Hx    . Diabetes type I Neg Hx    . Glaucoma Neg Hx    . Macular degeneration Neg Hx        Review of Systems:   Review of Systems  Constitutional:  Positive for malaise/fatigue. Negative for chills and fever.  HENT:  Positive for congestion.   Respiratory:  Positive for cough. Negative for shortness of breath.   Cardiovascular:  Negative for chest pain, palpitations, orthopnea and leg swelling.  Gastrointestinal:  Negative for abdominal pain, nausea and vomiting.      Physical Exam:    Current Vital Signs 24h Vital Sign Ranges  T 36.9 C  (98.4 F) (10/11/24 1300) Temp  Avg: 37.2 C (98.9 F)  Min: 36.9 C (98.4 F)  Max: 37.4 C (99.3 F)  BP 128/66 (10/11/24 1800) BP  Min: 95/54  Max: 128/66  HR 82 (10/11/24 1800) Pulse  Avg: 81  Min: 79  Max: 82  RR 16 (10/11/24 1800) Resp  Avg: 16.8  Min: 11  Max: 26  O2sat 97 %   SpO2  Avg: 97 %  Min: 96 %  Max: 98 %  Weight      Physical Exam  General No acute distress, well nourished, well developed  HEENT Mucous membranes are moist, no ulcerations. Sclerae anicteric. No cyanosis or conjunctival pallor. Pupils isocor.   Neck Supple, no lymphadenopathy, no thyromegaly. JVP "XX" cm of water. No carotid bruits heard.   CV Regular rate and rhythm, normal S1 and S2, no murmurs, rubs, gallops  Resp Normal respiratory effort. Clear to auscultation bilaterally, no wheeze, rhonchi, rales  Abd Soft, nontender, nondistended. No hepatosplenomegaly. No aortic bruits. Normal bowel sounds.  Ext No peripheral edema. Dorsalis pedis and posterior tibial pulses bilateral 2+ and symmetric. No varicosities. Warm  Skin No rash or lesions  Psych Alert and oriented x 3, adequate mood and normal affect, on gross neurological exam no focal neurologic deficit.      Medications   Current Medications Scheduled Meds: Continuous Infusions: PRN Meds:  Home Medications No current facility-administered medications on file prior to encounter.   Current Outpatient Medications on File Prior to Encounter  Medication Sig Dispense Refill  . apixaban  (ELIQUIS ) 5 mg tablet Take 1 tablet (5 mg total) by mouth every 12 (twelve) hours Resume taking on 2/23    . blood glucose diagnostic test strip Use as instructed.to test blood sugars four  times daily e11.9 freestyle lite  test strips 400 each 3  . blood-glucose sensor (DEXCOM G7 SENSOR) Devi Use 1 each every 10 (ten) days    . blood-glucose,receiver,cont (DEXCOM G7 RECEIVER) Misc Use    . carvediloL  (COREG ) 12.5 MG tablet Take 3 tablets (37.5 mg total) by mouth  every 12 (twelve) hours 180 tablet 11  . cyanocobalamin (VITAMIN B12) 1000 MCG tablet Take 1 tablet (1,000 mcg total) by mouth once daily 30 tablet 11  . doxercalciferoL  (HECTOROL ) 0.5 MCG capsule Take 1 capsule (0.5 mcg total) by mouth once daily    . empagliflozin  (JARDIANCE ) 10 mg tablet Take 1 tablet (10 mg total) by mouth once daily 30 tablet 11  . insulin  GLARGINE (LANTUS  SOLOSTAR U-100 INSULIN ) pen injector (concentration 100 units/mL) Inject 15 Units subcutaneously once daily 15 mL 3  . insulin  LISPRO (HUMALOG KWIKPEN INSULIN ) pen injector (concentration 100 units/mL) 4-6 units TID AC plus SSI prn ( up to 70 units per day) 1 unit per 5 gm carb (Patient taking differently: Inject subcutaneously 3 (three) times daily with meals Using sliding scale of 1 unit per 7 gm carb.) 15 mL 0  . lancets 28 gauge Misc Inject 1 each subcutaneously 4 (four) times daily 400 each 3  . magnesium  oxide (MAG-OX) 400 mg (241.3 mg  magnesium ) tablet Take 3 tablets by mouth 3 (three) times daily    . mycophenolate  (MYFORTIC ) 180 MG DR tablet Takes two tablets twice a day    . pantoprazole  (PROTONIX ) 40 MG DR tablet Take 40 mg by mouth every morning    . pen needle, diabetic (BD ULTRA-FINE NANO PEN NEEDLE) 32 gauge x 5/32 Ndle Inject 1 each subcutaneously 5 (five) times daily 500 each 3  . potassium chloride  (KLOR-CON  M20) 20 MEQ ER tablet Take 1 tablet (20 mEq total) by mouth 2 (two) times daily with meals 60 tablet 11  . predniSONE  (DELTASONE ) 5 MG tablet Take 5 mg by mouth every morning       . PREVYMIS  480 mg tablet Take 480 mg by mouth once daily    . rosuvastatin  (CRESTOR ) 10 MG tablet Take 1 tablet (10 mg total) by mouth at bedtime 30 tablet 11  . sulfamethoxazole -trimethoprim  (BACTRIM  SS) 400-80 mg tablet Take 1 tablet by mouth once daily    . tacrolimus  (ENVARSUS  XR) 0.75 mg extended-release tablet Take 1.5 mg by mouth once daily    . TORsemide  (DEMADEX ) 20 MG tablet Take 3 tablets (60 mg total) by mouth  once daily 90 tablet 11  . WES-PHOS 250 NEUTRAL 250 mg tablet Take 2 tablets by mouth 2 (two) times daily       Labs & Studies    Recent Labs    10/08/23 1431 03/01/24 1155 08/01/24 2049 08/02/24 0755 10/11/24 1356  WBC  --    < > 11.2*   < > 9.7  HGB  --    < > 9.4*   < > 8.4*  HCT  --    < > 29.1*   < > 27.0*  MCV  --    < > 97   < > 87  PLT  --    < > 150   < > 180  CREATININE  --    < > 2.2*   < > 3.1*  BUN  --    < > 23*   < > 27*  NA  --    < > 130*   < > 139  137  K  --    < > 4.3   < > 3.8  CL  --    < > 99   < > 104  CO2  --    < > 20*   < > 25  ALT  --   --  23   < > 10*  AST  --   --  21   < > 13*  GGT  --   --  219*  --   --   ALKPHOS  --   --  135*   < > 67  INR 1.1  --  1.2*   < > 1.6*  PROTIME 11.9  --   --   --   --    < > = values in this interval not displayed.    No results for input(s): TROPONINT, CK, CKMB in the last 73719 hours.  Additional Labs:   Radiology & Cardiac Imaging:  Chest x-ray: No acute abnormalities  EKG: Sinus, T wave abnormalities similar to prior  Echocardiogram: CONCLUSION ------------------------------------------------------------------------------- MODERATE LEFT VENTRICULAR SYSTOLIC DYSFUNCTION WITH NO LVH, LV SIZE IS MILDLY ENLARGED ESTIMATED EF: 30%, CALC EF(3D): 33% ELEVATED LA PRESSURES WITH DIASTOLIC DYSFUNCTION (GRADE 3) NORMAL RIGHT VENTRICULAR SYSTOLIC FUNCTION VALVULAR REGURGITATION: No AR, MILD MR, TRIVIAL PR, MILD TR  ESTIMATED RVSP: 37 mmHg (Normal) NO VALVULAR STENOSIS                                                                        Compared with prior Echo study on 08/21/2023  LVEF SLIGHTLY DECREASED      Assessment & Recommendations  JERUSALEM WERT is a 68 y.o.male with a 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  Recent admission returns with weight gain, ongoing cough, generalized weakness and shortness of breath.  Weight largely unchanged.  Creatinine is stable.  He is compliant with his medicines.  No arrhythmias.  proBNP is improving.  The patient states that his been pain largely the same since discharge.  He has been compliant with his torsemide .  He appears to be improving to closer to euvolemia.  He is not hypotensive.  He had an extended viral panel from back is negative.  His chest x-ray shows improvement in his bilateral pleural effusions and there does not appear drain that was placed in his allograft continue appears to be functioning well.SABRA  He is saturating well on room air.  Plan - admit to [facility] for diuresis - give lasix  80 mg IV x1 -Can get thyroid level for fatigue - can follow renal transplant recs for dosing from 11/19 note (envarsus  1.5 mg daily) - continue myfortic  360 mg BID - continue pred 5  - get FK level at 8 AM tomorrow - Last nephrology note suggests FK trough 5-7 ng/ml  - continue ppx with letermovir  480 mg qD and bactrim  400-80 mg qD - continue eliquis  5 mg bid - continue home GDMT  - empa 10 mg QD  - coreg  37.5 mg q12h  - do not give ACEi    Thank you for allowing us  to participate in the care of this patient. Please do not hesitate to contact pager [phone] with any questions.  DAVID OZELL HOLMES, MD  10/11/2024

## 2024-10-11 NOTE — ED Notes (Signed)
 Patient transported to ultrasound.

## 2024-10-11 NOTE — ED Provider Notes (Signed)
 [facility] EMERGENCY DEPT  ED Provider Note History   Chief Complaint  Patient presents with  . Shortness of Breath  . Generalized Weakness    History of Present Illness  HPI Level of Interpreter Services: No interpreter needed (no language barrier)  Nicholas Glenn is a 68 y.o. male with past medical history of polycystic kidney disease s/p LDKT (2006) and DDKT (Aug 2025), history of metastatic sarcoma in remission, HFrEF (EF 30% 09/2024), SVT, AF, LUE DVT (on Eliquis ) who presents to the emergency department for evaluation of Shortness of Breath and Generalized Weakness  Patient presents for evaluation of shortness of breath and generalized weakness x 1 day.  States that he felt overall well at time of discharge on 11/19 and over the last few days, however, felt unwell this morning.  Reports dyspnea with exertion as well as orthopnea.  Reports compliance with daily torsemide  60 mg.  Weighs himself daily and has had 0.5 kg weight gain over the last few days.  Reports lower extremity swelling is at baseline.  Denies chest pain or palpitations.  Endorses chronic dry cough associated with congestion and sinus pressure.  Felt warm by the fire earlier today, though did not have a documented fever.  Reports normal urine output.  EDW 82.2 kg   Past Medical History:  Diagnosis Date  . Acute GI bleeding 07/23/2021  . Acute on chronic diastolic CHF (congestive heart failure) (CMS/HHS-HCC) 08/29/2024  . Anemia in chronic kidney disease 03/09/2021  . Basal cell carcinoma 05/07/2015   left ear, s/p Mohs surgery  . 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 Coronary artery disease involving native coronary artery of native heart without angina pectoris 04/08/2020  . Diabetes (CMS/HHS-HCC)   . Diverticulitis 02/15/18  . History of melanoma    October and December 2014  . History of melanoma 03/2015   left neck behind the ear.    SABRA History of melanoma, 1997 06/11/2012   back. 1997x 2, 2013, 2014  . Hypertension 06/11/2012  . 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 Motion sickness   . OSA (obstructive sleep apnea) 01/04/2019  . Other nonthrombocytopenic purpura 06/15/2021  . Other specified diseases of liver 03/09/2021  . Other specified diseases of liver 03/09/2021  . PKD (polycystic kidney disease) 06/11/2012  . S/p nephrectomy, bilateral native 05/29/2005  . Sleep apnea   . Snores   . 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 Transient alteration of awareness 10/21/2018  . Volume overload 03/14/2021   Past Surgical History:  Procedure Laterality Date  . NEPHRECTOMY Bilateral 05/2005  . TRANSPLANT KIDNEY Left 07/2005  . EXCISION TUMOR THIGH/KNEE RADICAL Left 02/28/2013   Procedure: RAD RESECTION TUMOR SOFT TISSUE THIGH/KNEE 5+CM;  Surgeon: Redell FORBES Osmond, MD;  Location: [facility] OR;  Service: Orthopedics;  Laterality: Left;  . EXPLORATION POPLITEAL ARTERY Left 02/28/2013   Procedure: EXPLORATION POPLITEAL ARTERY;  Surgeon: Redell FORBES Osmond, MD;  Location: [facility] OR;  Service: Orthopedics;  Laterality: Left;  . Left VATS wedge resection  08/17/2015  . SIGMOIDOSCOPY FLEXIBLE N/A 03/19/2017   Procedure: Flexible Sigmoidoscopy;  Surgeon: Alm Veria Barcelona, MD;  Location: [facility] END[facility];  Service: Gastroenterology;  Laterality: N/A;  . COLONOSCOPY N/A 09/12/2019   Procedure: COLORECTAL CANCER SCREENING; COLONOSCOPY ON INDIVIDUAL AT HIGH RISK;  Surgeon: Merle Norleen Ricardo Mickey., MD;  Location: [facility];  Service:  Gastroenterology;  Laterality: N/A;  . COLONOSCOPY W/BIOPSY N/A 07/15/2020   Procedure: COLONOSCOPY, FLEXIBLE; DIAGNOSTIC;  Surgeon: Willma Morene Shad, MD;  Location: [facility];  Service: Gastroenterology;  Laterality: N/A;  . LAPAROSCOPIC COLECTOMY PARTIAL W/ANASTAMOSIS N/A 08/04/2020   Procedure:  Laparoscopic, possible open, sigmoid colectomy;  Surgeon: Florie Lonni Centers, MD;  Location: [facility] OR;  Service: General Surgery;  Laterality: N/A;  . LYSIS ADHESIONS N/A 08/04/2020   Procedure: ENTEROLYSIS (FREEING OF INTESTINAL ADHESION) (SEPARATE PROCEDURE);  Surgeon: Florie Lonni Centers, MD;  Location: [facility] OR;  Service: General Surgery;  Laterality: N/A;  . MOBILIZATION SPLENIC FLEXURE W/COLECTOMY PARTIAL N/A 08/04/2020   Procedure: MOBILIZATION (TAKE-DOWN) OF SPLENIC FLEXURE PERFORMED IN CONJUNCTION WITH PARTIAL COLECTOMY (LIST IN ADDITION TO PRIMARY PROCEDURE);  Surgeon: Florie Lonni Centers, MD;  Location: [facility] OR;  Service: General Surgery;  Laterality: N/A;  . PROCTOSIGMOIDOSCOPY RIGID N/A 08/04/2020   Procedure: PROCTOSIGMOIDOSCOPY, RIGID; DIAGNOSTIC, WITH OR WITHOUT COLLECTION OF SPECIMEN(S) BY BRUSHING OR WASHING (SEPARATE PROCEDURE);  Surgeon: Florie Lonni Centers, MD;  Location: [facility] OR;  Service: General Surgery;  Laterality: N/A;  . APPLICATION WOUND VAC N/A 08/04/2020   Procedure: NEGATIVE PRESSURE WOUND THERAPY, INCLUDING TOPICAL APPLICATION(S), ASSESSMENT, INSTRUCTION(S) FOR ONGOING CARE, PER SESSION; TOTAL WOUND(S) SURFACE AREA LESS THAN OR EQUAL TO 50 SQUARE CENTIMETERS;  Surgeon: Florie Lonni Centers, MD;  Location: [facility] OR;  Service: General Surgery;  Laterality: N/A;  . CYSTOURETHROSCOPY W/URETERAL CATHIZATION W/WO RETROGRADE PYELOGRAM Left 08/04/2020   Procedure: CYSTOURETHROSCOPY, WITH URETERAL CATHETERIZATION, WITH OR WITHOUT IRRIGATION, INSTILLATION, OR URETEROPYELOGRAPHY, EXCLUSIVE OF RADIOLOGIC SERVICE;  Surgeon: Elda Ozell Locus, MD;  Location: [facility] OR;  Service: Urology;  Laterality: Left;  . URETEROGRAM RETROGRADE W/WO KUB N/A 08/04/2020   Procedure: UROGRAPHY, RETROGRADE, WITH OR WITHOUT KUB;  Surgeon: Elda Ozell Locus, MD;  Location: [facility] OR;  Service: Urology;  Laterality: N/A;  .  OPEN ANASTAMOSIS ARTERIOVENOUS BY UPPER ARM VEIN TRANSPOSITION Left 01/17/2021   Procedure: ARTERIOVENOUS ANASTOMOSIS, LEFT BASILIC VEIN TRANSPOSITION;  Surgeon: Darra Dozier Blunt, MD;  Location: [facility] OR;  Service: General Surgery;  Laterality: Left;  . THORACOSCOPY WITH THERAPEUTIC WEDGE RESECTION INITIAL UNILATERAL Left 03/01/2021   Procedure: THORACOSCOPY, SURGICAL; WITH THERAPEUTIC WEDGE RESECTION (EG, MASS, NODULE), INITIAL UNILATERAL;  Surgeon: Valli Dickey Fitch, MD;  Location: [facility];  Service: Cardiothoracic;  Laterality: Left;  . OPEN ANASTAMOSIS ARTERIOVENOUS BY UPPER ARM VEIN TRANSPOSITION Left 03/28/2021   Procedure: ARTERIOVENOUS ANASTOMOSIS, OPEN; BY UPPER ARM BASILIC VEIN TRANSPOSITION;  Surgeon: Darra Dozier Blunt, MD;  Location: [facility] OR;  Service: General Surgery;  Laterality: Left;  . ENDOSCOPIC RETROGRADE CHOLANGIO-PANCREATOGRAPHY W/SPHINCTEROTOMY N/A 06/30/2021   Procedure: ENDOSCOPIC RETROGRADE CHOLANGIOPANCREATOGRAPHY (ERCP); WITH SPHINCTEROTOMY/PAPILLOTOMY;  Surgeon: Alvan Gwendlyn Bars, MD;  Location: [facility];  Service: Gastroenterology;  Laterality: N/A;  . ENDOSCOPY OF BILIARY DUCT  06/30/2021   Procedure: ENDOSCOPIC CATHETERIZATION OF THE BILIARY DUCTAL SYSTEM, RADIOLOGICAL SUPERVISION AND INTERPRETATION;  Surgeon: Alvan Gwendlyn Bars, MD;  Location: [facility];  Service: Gastroenterology;;  . ENDOSCOPIC RETROGRADE CHOLANGIO-PANCREATOGRAPHY W/EXTRACTION STONE N/A 06/30/2021   Procedure: ENDOSCOPIC RETROGRADE CHOLANGIOPANCREATOGRAPHY (ERCP); WITH REMOVAL OF CALCULI/DEBRIS FROM BILIARY/PANCREATIC DUCT(S);  Surgeon: Alvan Gwendlyn Bars, MD;  Location: [facility];  Service: Gastroenterology;  Laterality: N/A;  . ENDOSCOPIC RETROGRADE CHOLANGIO-PANCREATOGRAPHY W/BIOPSY N/A 06/30/2021   Procedure: ENDOSCOPIC RETROGRADE CHOLANGIOPANCREATOGRAPHY (ERCP); WITH BIOPSY, SINGLE OR MULTIPLE;   Surgeon: Alvan Gwendlyn Bars, MD;  Location: [facility];  Service: Gastroenterology;  Laterality: N/A;  . EXCISION TUMOR  THIGH/KNEE RADICAL Left 02/20/2022   Procedure: RADICAL RESECTION OF TUMOR (EG, SARCOMA), SOFT TISSUE OF THIGH OR KNEE AREA; LESS THAN 5 CM;  Surgeon: Dain Redell BRAVO, MD;  Location: [facility] OR;  Service: Orthopedics;  Laterality: Left;  . COLONOSCOPY W/REMOVAL LESIONS BY SNARE N/A 01/10/2023   Procedure: COLONOSCOPY, FLEXIBLE; WITH REMOVAL OF TUMOR(S), POLYP(S), OR OTHER LESION(S) BY SNARE TECHNIQUE;  Surgeon: Orval Elsie Ned, MD;  Location: [facility];  Service: Gastroenterology;  Laterality: N/A;  . COLONOSCOPY W/BIOPSY N/A 01/10/2023   Procedure: COLONOSCOPY, FLEXIBLE; WITH BIOPSY, SINGLE OR MULTIPLE;  Surgeon: Orval Elsie Ned, MD;  Location: [facility];  Service: Gastroenterology;  Laterality: N/A;  . MOHS SURGERY  04/11/2023  . BRONCHOSCOPY Bilateral 08/05/2024   Procedure: BRONCHOSCOPY, RIGID OR FLEXIBLE, INCLUDING FLUOROSCOPIC GUIDANCE, WHEN PERFORMED; WITH BRONCHIAL ALVEOLAR LAVAGE;  Surgeon: Keturah Quan, MD;  Location: [facility];  Service: Pulmonary;  Laterality: Bilateral;  . HERNIA REPAIR Left    inguinal   Family History  Problem Relation Age of Onset  . Cataracts Mother   . Dementia Mother   . Kidney disease Father   . High blood pressure (Hypertension) Father        No comment  . Polycystic kidney disease Father   . ESRD-Transplant Father   . Kidney cancer Brother        No comment  . ESRD-Transplant Brother   . Cancer Brother        Kidney cancer  . Colon cancer Maternal Grandfather        No comment  . Polycystic kidney disease Son   . Anesthesia problems Neg Hx   . Malignant hypertension Neg Hx   . Malignant hyperthermia Neg Hx   . Pseudochol deficiency Neg Hx   . PONV Neg Hx   . Blindness Neg Hx   . Diabetes type I Neg Hx   . Glaucoma Neg Hx   . Macular degeneration Neg Hx     Social History   Socioeconomic History  . Marital status: Married    Spouse name: Honest Vanleer  Occupational History  . Occupation: [employer]  . Occupation: Retired CDR in the [employer]  Tobacco Use  . Smoking status: Never    Passive exposure: Never  . Smokeless tobacco: Never  Vaping Use  . Vaping status: Never Used  Substance and Sexual Activity  . Alcohol use: Never  . Drug use: Never  . Sexual activity: Defer    Partners: Female    Birth control/protection: Post-menopausal, None    Comment: Married  Social History Narrative   Patient lives with his wife and 1 cat. Patient is a Environmental Health Practitioner. Wife will be support   Social Drivers of Corporate Investment Banker Strain: Low Risk  (10/06/2024)   Overall Financial Resource Strain (CARDIA)   . Difficulty of Paying Living Expenses: Not hard at all  Food Insecurity: No Food Insecurity (10/06/2024)   Hunger Vital Sign   . Worried About Programme Researcher, Broadcasting/film/video in the Last Year: Never true   . Ran Out of Food in the Last Year: Never true  Transportation Needs: No Transportation Needs (10/06/2024)   PRAPARE - Transportation   . Lack of Transportation (Medical): No   . Lack of Transportation (Non-Medical): No  Physical Activity: Insufficiently Active (06/04/2020)   Exercise Vital Sign   . Days of Exercise per Week: 3 days   . Minutes of Exercise per Session: 30 min  Stress: No  Stress Concern Present (06/04/2020)   [employer] of Occupational Health - Occupational Stress Questionnaire   . Feeling of Stress : Not at all  Housing Stability: Low Risk  (10/06/2024)   Housing Stability Vital Sign   . Unable to Pay for Housing in the Last Year: No   . Number of Times Moved in the Last Year: 0   . Homeless in the Last Year: No   Review of Systems  Constitutional:  Negative for fever.  HENT:  Positive for congestion and sinus pressure. Negative for sore throat.   Respiratory:  Positive for cough and shortness of  breath.   Cardiovascular:  Positive for leg swelling. Negative for chest pain and palpitations.  Gastrointestinal:  Negative for abdominal pain, constipation, diarrhea, nausea and vomiting.  Genitourinary:  Negative for dysuria and hematuria.  Neurological:  Positive for weakness (generalized). Negative for light-headedness and headaches.    Physical Exam  BP 95/54 (BP Location: Right upper arm, Patient Position: Sitting)   Pulse 79   Temp 37.4 C (99.3 F) (Tympanic)   Resp 14   SpO2 97%  Physical Exam Vitals reviewed.  Constitutional:      General: He is not in acute distress.    Appearance: Normal appearance. He is ill-appearing (chronically). He is not toxic-appearing.     Comments: Chronically ill and tired-appearing, though in no acute distress  HENT:     Head: Normocephalic and atraumatic.     Right Ear: External ear normal.     Left Ear: External ear normal.     Nose: Nose normal.     Mouth/Throat:     Mouth: Mucous membranes are moist.  Eyes:     Extraocular Movements: Extraocular movements intact.     Conjunctiva/sclera: Conjunctivae normal.  Cardiovascular:     Rate and Rhythm: Normal rate and regular rhythm.     Pulses: Normal pulses.          Radial pulses are 2+ on the right side and 2+ on the left side.       Dorsalis pedis pulses are 2+ on the right side and 2+ on the left side.     Heart sounds: Normal heart sounds.  Pulmonary:     Effort: Pulmonary effort is normal. Tachypnea present.     Breath sounds: Normal breath sounds.  Abdominal:     General: A surgical scar is present. There is distension.     Palpations: Abdomen is soft.     Tenderness: There is no abdominal tenderness. There is no guarding.  Musculoskeletal:        General: Normal range of motion.     Cervical back: Normal range of motion and neck supple.     Right lower leg: No edema.     Left lower leg: 3+ Edema present.  Skin:    General: Skin is warm and dry.     Capillary Refill:  Capillary refill takes less than 2 seconds.  Neurological:     General: No focal deficit present.     Mental Status: He is alert.     GCS: GCS eye subscore is 4. GCS verbal subscore is 5. GCS motor subscore is 6.  Psychiatric:        Mood and Affect: Mood normal.        Behavior: Behavior normal.    Physical Exam   Procedures  Procedures   Results  Medical Decision Making   Medical Decision Making     Assessment and  Plan:     Nicholas Glenn is a 68 y.o. male with past medical history of polycystic kidney disease s/p LDKT (2006) and DDKT (Aug 2025), history of metastatic sarcoma in remission, HFrEF (EF 30% 09/2024), SVT, AF, LUE DVT (on Eliquis ) who presents to the emergency department for evaluation of Shortness of Breath and Generalized Weakness. Vitals reviewed as above - mildly hypotensive on arrival, improved to 118/67 when rechecked in room. Patient afebrile and non-tachycardic, though tachypneic on room air with saturations >94% at rest. Patient appears chronically ill and tired, though in no acute distress. Abdomen distended, though non-tender. LLE pitting edema reported as baseline to slightly improved compared to recent admission. Plan for basic labs, troponin, pro-BNP, shock and CXR. If no significant findings on CXR will obtain VQ scan to evaluate for PE given history of renal transplant and CKD would like to avoid contrast.  Differential Diagnosis:    In addition to the differential diagnoses listed, other diagnoses were considered.      Consider possible decompensated heart failure given exertional dyspnea, though lungs clear to auscultation and lower extremity edema reported as baseline to slightly improved. Also consider ACS - EKG abnormal, though similar to prior and no evidence of STEMI. Troponin pending. Also consider viral infection, bronchitis, pneumonia given URI symptoms and cough. PE is also a consideration given recent admissions and surgery, though reassuringly  has not reported any missed doses of Eliquis . Also considered pleural effusion, pneumothorax, symptomatic anemia, deconditioning, other Medical Complexity:    New and requires workup.     Pertinent labs & imaging results that were available during my care of the patient were reviewed by me and considered in my medical decision making.     I reviewed previous medical records.     I independently visualized image(s), tracing(s), and/or specimen(s).     I discussed the patient with another provider.   Summary Statement:     3:00 PM Plan of care signed out to oncoming team pending labs and CXR. If no significant finding on CXR to explain dyspnea, would recommend VQ scan. Patient may require admission.   Labs Reviewed  CULTURE, BLOOD  CULTURE, BLOOD  RESPIRATORY PATHOGEN EXTENDED PANEL, PCR  COMPLETE BLOOD COUNT (CBC) WITH DIFFERENTIAL  COMPREHENSIVE METABOLIC PANEL (CMP)  MAGNESIUM   PRO-BRAIN NATRIURETIC PEPTIDE, N-TERMINAL (NT-PRO-BNP)  TROPONIN I, HIGH SENSITIVITY ED EVALUATION  ACTIVATED PARTIAL THROMBOPLASTIN TIME (APTT)  PROTHROMBIN  TIME (INR)  SHOCK PANEL, VENOUS   X-ray chest PA and lateral    (Results Pending)   Medications - No data to display None   ED Course as of 10/12/24 0516  Sat Oct 11, 2024  1409 Hemoglobin(!): 8.4 Baseline 8-9, denies bleeding sources [AC]  1409 WBC: 9.7 Similar to recent labs with WBC ~9s [AC]  1412 Lactate, Venous: 1.4 [AC]  1412 pH, Venous: 7.42 [AC]  1412 PCO2, Venous: 40 [AC]  1412 Bicarbonate, Venous: 26 [AC]  1448 Magnesium : 1.8 [AC]  1448 hsTnI Baseline: 42 [AC]  1448 Creatinine(!): 3.1 Baseline 2.8-3.1 [AC]  1448 Potassium: 3.8 [AC]  1448 ECG 12-lead Personal interpretation: NSR 78 bpm with PVCs, ST depression in V5-V6, lead I and II with TWI, similar to prior [AC]  Sun Oct 12, 2024  0039 US  renal transplant complete Interval resolution of mild pelviectasis. No hydronephrosis.   Increasing peak systolic velocities at the main  renal artery anastomosis measuring 220 cm/s, previously 185 cm/s, and previously 59.5 cm/s before that. Parenchymal resistive indices within normal limits ranging from 0.64-0.76.  Similar size of complex peritransplant collection measuring up to 5.9 cm. Small volume free fluid redemonstrated.   [TP]  [id number] Nephrology paged for final recs [TP]  [phone] Spoke with nephrology who explained that they mainly wanted the ultrasound to follow-up on the fluid previously visualized around his kidney which is stable.  They do not think there is any admittable diagnosis.  They recommend following outpatient; He has an appointment early December. They recommended collecting a CMV.  Patient structured to follow-up results on their MyChart [TP]    ED Course User Index [AC] Bluford Honour, [place] [TP] Arnell Rosebush, MD        ED Clinical Impression  1. Shortness of breath               This note has been created using automated tools and reviewed for accuracy by [facility].   AI Note Feedback-- [facility], [place], [place] 10/11/24 [phone]

## 2024-10-11 NOTE — ED Notes (Signed)
 Pt to xray

## 2024-10-12 NOTE — ED Notes (Signed)
 Reassessed by the provider and advised that patient can be discharge home. Discharge instructions given to the patient and verbalized full understanding. Patient left emergency department with all of his belongings.

## 2024-10-12 NOTE — ED Notes (Signed)
 Pt was saturating 94%-96% while ambulation. MD made aware.

## 2024-10-13 ENCOUNTER — Encounter (HOSPITAL_COMMUNITY): Payer: Self-pay

## 2024-10-13 ENCOUNTER — Emergency Department (HOSPITAL_COMMUNITY)

## 2024-10-13 ENCOUNTER — Other Ambulatory Visit: Payer: Self-pay

## 2024-10-13 ENCOUNTER — Inpatient Hospital Stay (HOSPITAL_COMMUNITY)
Admission: EM | Admit: 2024-10-13 | Discharge: 2024-10-16 | DRG: 871 | Disposition: A | Discharge status: Home / Self Care | Attending: Family Medicine | Admitting: Family Medicine

## 2024-10-13 ENCOUNTER — Inpatient Hospital Stay (HOSPITAL_COMMUNITY)

## 2024-10-13 DIAGNOSIS — Z7984 Long term (current) use of oral hypoglycemic drugs: Secondary | ICD-10-CM | POA: Diagnosis not present

## 2024-10-13 DIAGNOSIS — R509 Fever, unspecified: Secondary | ICD-10-CM | POA: Diagnosis not present

## 2024-10-13 DIAGNOSIS — Z85820 Personal history of malignant melanoma of skin: Secondary | ICD-10-CM

## 2024-10-13 DIAGNOSIS — I480 Paroxysmal atrial fibrillation: Secondary | ICD-10-CM | POA: Diagnosis present

## 2024-10-13 DIAGNOSIS — D84821 Immunodeficiency due to drugs: Secondary | ICD-10-CM | POA: Diagnosis present

## 2024-10-13 DIAGNOSIS — Z940 Kidney transplant status: Secondary | ICD-10-CM | POA: Diagnosis not present

## 2024-10-13 DIAGNOSIS — T8619 Other complication of kidney transplant: Secondary | ICD-10-CM | POA: Diagnosis present

## 2024-10-13 DIAGNOSIS — R188 Other ascites: Secondary | ICD-10-CM | POA: Diagnosis present

## 2024-10-13 DIAGNOSIS — R6520 Severe sepsis without septic shock: Secondary | ICD-10-CM | POA: Diagnosis present

## 2024-10-13 DIAGNOSIS — D631 Anemia in chronic kidney disease: Secondary | ICD-10-CM | POA: Diagnosis present

## 2024-10-13 DIAGNOSIS — Y830 Surgical operation with transplant of whole organ as the cause of abnormal reaction of the patient, or of later complication, without mention of misadventure at the time of the procedure: Secondary | ICD-10-CM | POA: Diagnosis present

## 2024-10-13 DIAGNOSIS — Z79621 Long term (current) use of calcineurin inhibitor: Secondary | ICD-10-CM

## 2024-10-13 DIAGNOSIS — Z794 Long term (current) use of insulin: Secondary | ICD-10-CM | POA: Diagnosis not present

## 2024-10-13 DIAGNOSIS — N170 Acute kidney failure with tubular necrosis: Secondary | ICD-10-CM | POA: Diagnosis present

## 2024-10-13 DIAGNOSIS — R5383 Other fatigue: Secondary | ICD-10-CM | POA: Diagnosis present

## 2024-10-13 DIAGNOSIS — Q612 Polycystic kidney, adult type: Secondary | ICD-10-CM | POA: Diagnosis not present

## 2024-10-13 DIAGNOSIS — N186 End stage renal disease: Secondary | ICD-10-CM | POA: Diagnosis present

## 2024-10-13 DIAGNOSIS — I3139 Other pericardial effusion (noninflammatory): Secondary | ICD-10-CM | POA: Diagnosis present

## 2024-10-13 DIAGNOSIS — E1165 Type 2 diabetes mellitus with hyperglycemia: Secondary | ICD-10-CM | POA: Diagnosis present

## 2024-10-13 DIAGNOSIS — N5089 Other specified disorders of the male genital organs: Secondary | ICD-10-CM | POA: Diagnosis present

## 2024-10-13 DIAGNOSIS — I5042 Chronic combined systolic (congestive) and diastolic (congestive) heart failure: Secondary | ICD-10-CM

## 2024-10-13 DIAGNOSIS — A419 Sepsis, unspecified organism: Principal | ICD-10-CM | POA: Diagnosis present

## 2024-10-13 DIAGNOSIS — N3000 Acute cystitis without hematuria: Secondary | ICD-10-CM | POA: Diagnosis present

## 2024-10-13 DIAGNOSIS — Z86718 Personal history of other venous thrombosis and embolism: Secondary | ICD-10-CM

## 2024-10-13 DIAGNOSIS — J189 Pneumonia, unspecified organism: Secondary | ICD-10-CM | POA: Diagnosis present

## 2024-10-13 DIAGNOSIS — I132 Hypertensive heart and chronic kidney disease with heart failure and with stage 5 chronic kidney disease, or end stage renal disease: Secondary | ICD-10-CM | POA: Diagnosis present

## 2024-10-13 DIAGNOSIS — I5043 Acute on chronic combined systolic (congestive) and diastolic (congestive) heart failure: Secondary | ICD-10-CM | POA: Diagnosis present

## 2024-10-13 DIAGNOSIS — E785 Hyperlipidemia, unspecified: Secondary | ICD-10-CM | POA: Diagnosis present

## 2024-10-13 DIAGNOSIS — Z85831 Personal history of malignant neoplasm of soft tissue: Secondary | ICD-10-CM

## 2024-10-13 DIAGNOSIS — Z905 Acquired absence of kidney: Secondary | ICD-10-CM

## 2024-10-13 DIAGNOSIS — Z881 Allergy status to other antibiotic agents status: Secondary | ICD-10-CM

## 2024-10-13 DIAGNOSIS — Z79899 Other long term (current) drug therapy: Secondary | ICD-10-CM

## 2024-10-13 DIAGNOSIS — D849 Immunodeficiency, unspecified: Secondary | ICD-10-CM

## 2024-10-13 DIAGNOSIS — E1122 Type 2 diabetes mellitus with diabetic chronic kidney disease: Secondary | ICD-10-CM | POA: Diagnosis present

## 2024-10-13 DIAGNOSIS — Z7901 Long term (current) use of anticoagulants: Secondary | ICD-10-CM

## 2024-10-13 DIAGNOSIS — Z1152 Encounter for screening for COVID-19: Secondary | ICD-10-CM

## 2024-10-13 DIAGNOSIS — N1330 Unspecified hydronephrosis: Secondary | ICD-10-CM | POA: Diagnosis present

## 2024-10-13 DIAGNOSIS — Z91030 Bee allergy status: Secondary | ICD-10-CM

## 2024-10-13 LAB — COMPREHENSIVE METABOLIC PANEL WITH GFR
ALT: 10 U/L (ref 0–44)
AST: 14 U/L — ABNORMAL LOW (ref 15–41)
Albumin: 3.1 g/dL — ABNORMAL LOW (ref 3.5–5.0)
Alkaline Phosphatase: 73 U/L (ref 38–126)
Anion gap: 14 (ref 5–15)
BUN: 33 mg/dL — ABNORMAL HIGH (ref 8–23)
CO2: 24 mmol/L (ref 22–32)
Calcium: 8.9 mg/dL (ref 8.9–10.3)
Chloride: 101 mmol/L (ref 98–111)
Creatinine, Ser: 3.47 mg/dL — ABNORMAL HIGH (ref 0.61–1.24)
GFR, Estimated: 19 mL/min — ABNORMAL LOW (ref >60)
Glucose, Bld: 127 mg/dL — ABNORMAL HIGH (ref 70–99)
Potassium: 3.5 mmol/L (ref 3.5–5.1)
Sodium: 139 mmol/L (ref 135–145)
Total Bilirubin: 0.8 mg/dL (ref 0.0–1.2)
Total Protein: 6.2 g/dL — ABNORMAL LOW (ref 6.5–8.1)

## 2024-10-13 LAB — ECHOCARDIOGRAM COMPLETE
AR max vel: 1.57 cm2
AV Area VTI: 1.5 cm2
AV Area mean vel: 1.53 cm2
AV Mean grad: 4 mmHg
AV Peak grad: 8.3 mmHg
Ao pk vel: 1.44 m/s
Area-P 1/2: 5.02 cm2
Calc EF: 51.8 %
MV VTI: 1.52 cm2
S' Lateral: 3.6 cm
Single Plane A2C EF: 48.9 %
Single Plane A4C EF: 51 %

## 2024-10-13 LAB — CBC
HCT: 27.2 % — ABNORMAL LOW (ref 39.0–52.0)
Hemoglobin: 8.4 g/dL — ABNORMAL LOW (ref 13.0–17.0)
MCH: 26.8 pg (ref 26.0–34.0)
MCHC: 30.9 g/dL (ref 30.0–36.0)
MCV: 86.6 fL (ref 80.0–100.0)
Platelets: 169 K/uL (ref 150–400)
RBC: 3.14 MIL/uL — ABNORMAL LOW (ref 4.22–5.81)
RDW: 18.1 % — ABNORMAL HIGH (ref 11.5–15.5)
WBC: 8 K/uL (ref 4.0–10.5)
nRBC: 0 % (ref 0.0–0.2)

## 2024-10-13 LAB — CBC WITH DIFFERENTIAL/PLATELET
Abs Immature Granulocytes: 0.07 K/uL (ref 0.00–0.07)
Basophils Absolute: 0 K/uL (ref 0.0–0.1)
Basophils Relative: 0 %
Eosinophils Absolute: 0 K/uL (ref 0.0–0.5)
Eosinophils Relative: 0 %
HCT: 29.2 % — ABNORMAL LOW (ref 39.0–52.0)
Hemoglobin: 9.1 g/dL — ABNORMAL LOW (ref 13.0–17.0)
Immature Granulocytes: 1 %
Lymphocytes Relative: 2 %
Lymphs Abs: 0.2 K/uL — ABNORMAL LOW (ref 0.7–4.0)
MCH: 26.4 pg (ref 26.0–34.0)
MCHC: 31.2 g/dL (ref 30.0–36.0)
MCV: 84.6 fL (ref 80.0–100.0)
Monocytes Absolute: 0.6 K/uL (ref 0.1–1.0)
Monocytes Relative: 6 %
Neutro Abs: 9.3 K/uL — ABNORMAL HIGH (ref 1.7–7.7)
Neutrophils Relative %: 91 %
Platelets: 181 K/uL (ref 150–400)
RBC: 3.45 MIL/uL — ABNORMAL LOW (ref 4.22–5.81)
RDW: 18 % — ABNORMAL HIGH (ref 11.5–15.5)
WBC: 10.2 K/uL (ref 4.0–10.5)
nRBC: 0 % (ref 0.0–0.2)

## 2024-10-13 LAB — URINALYSIS, ROUTINE W REFLEX MICROSCOPIC
Bacteria, UA: NONE SEEN
Bilirubin Urine: NEGATIVE
Glucose, UA: >=500 mg/dL — AB
Ketones, ur: NEGATIVE mg/dL
Leukocytes,Ua: NEGATIVE
Nitrite: NEGATIVE
Protein, ur: 100 mg/dL — AB
Specific Gravity, Urine: 1.012 (ref 1.005–1.030)
pH: 7 (ref 5.0–8.0)

## 2024-10-13 LAB — BRAIN NATRIURETIC PEPTIDE: B Natriuretic Peptide: 2884.7 pg/mL — ABNORMAL HIGH (ref 0.0–100.0)

## 2024-10-13 LAB — RESP PANEL BY RT-PCR (RSV, FLU A&B, COVID)  RVPGX2
Influenza A by PCR: NEGATIVE
Influenza B by PCR: NEGATIVE
Resp Syncytial Virus by PCR: NEGATIVE
SARS Coronavirus 2 by RT PCR: NEGATIVE

## 2024-10-13 LAB — HEMOGLOBIN A1C
Hgb A1c MFr Bld: 6.9 % — ABNORMAL HIGH (ref 4.8–5.6)
Mean Plasma Glucose: 151 mg/dL

## 2024-10-13 LAB — I-STAT CG4 LACTIC ACID, ED
Lactic Acid, Venous: 1.7 mmol/L (ref 0.5–1.9)
Lactic Acid, Venous: 1.4 mmol/L (ref 0.5–1.9)

## 2024-10-13 LAB — CBG MONITORING, ED: Glucose-Capillary: 176 mg/dL — ABNORMAL HIGH (ref 70–99)

## 2024-10-13 LAB — PROCALCITONIN: Procalcitonin: 8.94 ng/mL

## 2024-10-13 LAB — HIV ANTIBODY (ROUTINE TESTING W REFLEX): HIV Screen 4th Generation wRfx: NONREACTIVE

## 2024-10-13 LAB — GLUCOSE, CAPILLARY
Glucose-Capillary: 264 mg/dL — ABNORMAL HIGH (ref 70–99)
Glucose-Capillary: 236 mg/dL — ABNORMAL HIGH (ref 70–99)

## 2024-10-13 LAB — CREATININE, SERUM
Creatinine, Ser: 3.3 mg/dL — ABNORMAL HIGH (ref 0.61–1.24)
GFR, Estimated: 20 mL/min — ABNORMAL LOW (ref >60)

## 2024-10-13 MED ORDER — APIXABAN 5 MG PO TABS
5.0000 mg | ORAL_TABLET | Freq: Two times a day (BID) | ORAL | Status: DC
  Administered 2024-10-13 – 2024-10-14 (×3): 5 mg via ORAL
  Filled 2024-10-13 (×3): qty 1

## 2024-10-13 MED ORDER — PREDNISONE 5 MG PO TABS
5.0000 mg | ORAL_TABLET | Freq: Every day | ORAL | Status: DC
  Administered 2024-10-14: 5 mg via ORAL
  Filled 2024-10-13 (×2): qty 1

## 2024-10-13 MED ORDER — TACROLIMUS ER 0.75 MG PO TB24
1.5000 mg | ORAL_TABLET | Freq: Every day | ORAL | Status: DC
  Administered 2024-10-14 – 2024-10-16 (×3): 1.5 mg via ORAL
  Filled 2024-10-13 (×3): qty 2

## 2024-10-13 MED ORDER — APIXABAN 5 MG PO TABS: 5.0000 mg | ORAL_TABLET | Freq: Two times a day (BID) | ORAL | Status: DC

## 2024-10-13 MED ORDER — HEPARIN SODIUM (PORCINE) 5000 UNIT/ML IJ SOLN: 5000.0000 [IU] | Freq: Three times a day (TID) | INTRAMUSCULAR | Status: DC

## 2024-10-13 MED ORDER — CARVEDILOL 25 MG PO TABS
37.5000 mg | ORAL_TABLET | Freq: Two times a day (BID) | ORAL | Status: DC
  Administered 2024-10-13 – 2024-10-16 (×6): 37.5 mg via ORAL
  Filled 2024-10-13 (×7): qty 1

## 2024-10-13 MED ORDER — MYCOPHENOLATE SODIUM 180 MG PO TBEC: 360.0000 mg | DELAYED_RELEASE_TABLET | Freq: Two times a day (BID) | ORAL | Status: DC

## 2024-10-13 MED ORDER — INSULIN ASPART 100 UNIT/ML IJ SOLN
0.0000 [IU] | Freq: Three times a day (TID) | INTRAMUSCULAR | Status: DC
  Administered 2024-10-13: 3 [IU] via SUBCUTANEOUS
  Administered 2024-10-13: 8 [IU] via SUBCUTANEOUS
  Administered 2024-10-14: 11 [IU] via SUBCUTANEOUS
  Administered 2024-10-14: 8 [IU] via SUBCUTANEOUS
  Administered 2024-10-15: 6 [IU] via SUBCUTANEOUS
  Administered 2024-10-15: 3 [IU] via SUBCUTANEOUS
  Administered 2024-10-15: 8 [IU] via SUBCUTANEOUS
  Administered 2024-10-16: 5 [IU] via SUBCUTANEOUS
  Administered 2024-10-16: 8 [IU] via SUBCUTANEOUS
  Filled 2024-10-13: qty 5
  Filled 2024-10-13: qty 6
  Filled 2024-10-13: qty 8
  Filled 2024-10-13 (×2): qty 1
  Filled 2024-10-13: qty 8
  Filled 2024-10-13 (×2): qty 3
  Filled 2024-10-13: qty 5
  Filled 2024-10-13: qty 1

## 2024-10-13 MED ORDER — FUROSEMIDE 10 MG/ML IJ SOLN
80.0000 mg | Freq: Two times a day (BID) | INTRAMUSCULAR | Status: AC
  Administered 2024-10-13 – 2024-10-14 (×3): 80 mg via INTRAVENOUS
  Filled 2024-10-13 (×3): qty 8

## 2024-10-13 MED ORDER — AZITHROMYCIN 500 MG PO TABS
500.0000 mg | ORAL_TABLET | Freq: Every day | ORAL | Status: DC
  Administered 2024-10-13 – 2024-10-14 (×2): 500 mg via ORAL
  Filled 2024-10-13: qty 2
  Filled 2024-10-13: qty 1

## 2024-10-13 MED ORDER — ENOXAPARIN SODIUM 30 MG/0.3ML IJ SOSY: 30.0000 mg | PREFILLED_SYRINGE | INTRAMUSCULAR | Status: DC

## 2024-10-13 MED ORDER — PANTOPRAZOLE SODIUM 40 MG PO TBEC
40.0000 mg | DELAYED_RELEASE_TABLET | Freq: Every morning | ORAL | Status: DC
  Administered 2024-10-13 – 2024-10-14 (×2): 40 mg via ORAL
  Filled 2024-10-13 (×3): qty 1

## 2024-10-13 MED ORDER — MAGNESIUM OXIDE -MG SUPPLEMENT 400 (240 MG) MG PO TABS
1200.0000 mg | ORAL_TABLET | Freq: Three times a day (TID) | ORAL | Status: DC
  Administered 2024-10-13 – 2024-10-14 (×5): 1200 mg via ORAL
  Filled 2024-10-13 (×6): qty 3

## 2024-10-13 MED ORDER — VANCOMYCIN HCL 1500 MG/300ML IV SOLN
1500.0000 mg | Freq: Once | INTRAVENOUS | Status: AC
  Administered 2024-10-13: 1500 mg via INTRAVENOUS
  Filled 2024-10-13: qty 300

## 2024-10-13 MED ORDER — MYCOPHENOLATE SODIUM 180 MG PO TBEC
360.0000 mg | DELAYED_RELEASE_TABLET | Freq: Two times a day (BID) | ORAL | Status: DC
  Administered 2024-10-13 – 2024-10-14 (×3): 360 mg via ORAL
  Filled 2024-10-13 (×3): qty 2

## 2024-10-13 MED ORDER — SODIUM CHLORIDE 0.9 % IV SOLN
1.0000 g | INTRAVENOUS | Status: DC
  Administered 2024-10-13 – 2024-10-16 (×4): 1 g via INTRAVENOUS
  Filled 2024-10-13 (×4): qty 10

## 2024-10-13 MED ORDER — INSULIN GLARGINE-YFGN 100 UNIT/ML ~~LOC~~ SOLN
10.0000 [IU] | Freq: Every day | SUBCUTANEOUS | Status: DC
  Administered 2024-10-13 – 2024-10-14 (×2): 10 [IU] via SUBCUTANEOUS
  Filled 2024-10-13 (×3): qty 0.1

## 2024-10-13 MED ORDER — ROSUVASTATIN CALCIUM 5 MG PO TABS
10.0000 mg | ORAL_TABLET | Freq: Every day | ORAL | Status: DC
  Administered 2024-10-13 – 2024-10-15 (×3): 10 mg via ORAL
  Filled 2024-10-13 (×3): qty 2

## 2024-10-13 MED ORDER — ACETAMINOPHEN 500 MG PO TABS
1000.0000 mg | ORAL_TABLET | Freq: Once | ORAL | Status: AC
  Administered 2024-10-13: 1000 mg via ORAL
  Filled 2024-10-13: qty 2

## 2024-10-13 MED ORDER — SODIUM CHLORIDE 0.9 % IV SOLN
2.0000 g | Freq: Once | INTRAVENOUS | Status: AC
  Administered 2024-10-13: 2 g via INTRAVENOUS
  Filled 2024-10-13: qty 12.5

## 2024-10-13 MED ORDER — LACTATED RINGERS IV BOLUS
1000.0000 mL | Freq: Once | INTRAVENOUS | Status: AC
  Administered 2024-10-13: 1000 mL via INTRAVENOUS

## 2024-10-13 NOTE — Progress Notes (Signed)
  Echocardiogram 2D Echocardiogram has been performed.  Norleen ORN Kenan Moodie 10/13/2024, 1:40 PM

## 2024-10-13 NOTE — ED Triage Notes (Addendum)
 Patient BIB EMS from home for evaluation of weakness and fever. EMS initially called out for possible sepsis. Patient reports feeling extremely weak, chills, fever for several days. Patient alert and oriented on arrival. Fever 104.66F. Patient reports kidney transplant 5 months ago.

## 2024-10-13 NOTE — Progress Notes (Signed)
  Echocardiogram 2D Echocardiogram has been performed.  Norleen ORN Keelyn Monjaras 10/13/2024, 1:38 PM

## 2024-10-13 NOTE — Consult Note (Signed)
 Cardiology Consultation  Patient ID: Nicholas Glenn MRN: [id number]; DOB: 27-Jun-1956  Admit date: 10/13/2024 Date of Consult: 10/13/2024  PCP:  Nicholas Lenis, MD   [facility] Providers Cardiologist:  Nicholas Glenn  Patient Profile: Nicholas Glenn is a 68 y.o. male with a hx of polycystic kidney disease s/p LDKT in 2006 followed by DDKT in 06/2024, metastatic sarcoma in remission, HFrEF (anthracycline-related cardiomyopathy with a recent hospitalization for HF exacerbation and found to have worsening EF (30%), SVT, paroxysmal atrial fibrillation/flutter, LUE DVT (on Eliquis ), type 2 diabetes, hypertension, who is being seen 10/13/2024 for the evaluation of acute on chronic CHF at the request of Dr. Georgina.  History of Present Illness: Nicholas Glenn has past medical history as above. He is followed closely at [facility] for nephrology and cardiology care.   He was just admitted to the heart failure service at [facility] from 11/13-11/19/2025 for volume overload. During this admission he had presented with LE edema and shortness of breath. proBNP was 40k, echo showed LVEF 30%. He had received IV diuresis with good response.   Medication regimen at the time of discharge included: Jardiance  10 mg daily, carvedilol  37.5 mg BID, Torsemide  60 mg daily, Crestor  10 mg daily, Eliquis  5 mg BID. There was no ACE/ARB/ARNI or MRA given due to renal failure.   He then presented to [facility] on 11/22 with generalized weakness and shortness of breath. He was seen by both cardiology and nephrology at [facility]. Cardiology at this time suggested IV Lasix  80 mg x 1 dose, while continuing Jardiance  and Coreg . Nephrology agreed with the dose of IV Lasix  and then discussed resuming PO diuretics.   He presented to [facility] ED  via EMS on 10/13/2024 for weakness, chills, fever that began this past Saturday. His initial temperature was noted to be 104.22F. other relevant workup includes: BNP 2,884, CBC showed anemia with hemoglobin 9.1,  creatinine 3.47 ? 3.30  Renal U/S showed moderate hydronephrosis of transplant kidney. CT showed small to moderate nonspecific ascites in abdomen and pelvis, partially loculated right pleural effusion, small pericardial effusion.   After speaking with the patient and his wife present in the room, they agree with the history as stated above. They report that he was feeling well until Saturday when he had his first episode of chills and feeling weak but it wasn't severe and he did fairly well the rest of the day. Then come Sunday he began feeling chills and a subjective fever, when they checked his temperature he was afebrile per his wife. Then this morning he found himself to be severely weak and feeling ill. He activated EMS and was brought to [facility] ED.   Past Medical History:  Diagnosis Date   CHF (congestive heart failure) (HCC) 10/18/2018   Hospitalized at the [facility] [place]    Diabetes mellitus without complication (HCC)    GI bleed 07/23/2021   Hypertension    Melanoma (HCC) 1997, 7986,7985, 03/2015   Polycystic kidney disease 06/11/2012   Past Surgical History:  Procedure Laterality Date   nephrectomy  05/29/2005   bilateral native   s/p kidney transplantation  08/14/2005   S/P livning donor kidney transplantation    Home Medications:  Prior to Admission medications   Medication Sig Start Date End Date Taking? Authorizing Provider  apixaban  (ELIQUIS ) 5 MG TABS tablet Take 5 mg by mouth 2 (two) times daily.   Yes [provider]  carvedilol  (COREG ) 12.5 MG tablet Take 37.5 mg by mouth 2 (two) times daily  with a meal.   Yes [provider]  cyanocobalamin (VITAMIN B12) 1000 MCG tablet Take 1,000 mcg by mouth every morning.   Yes [provider]  doxercalciferol  (HECTOROL ) 0.5 MCG capsule Take 0.5 mcg by mouth every Monday, Wednesday, and Friday.   Yes [provider]  empagliflozin  (JARDIANCE ) 25 MG TABS tablet Take 12.5 mg by mouth  daily. 10/07/24  Yes [provider]  ENVARSUS  XR 0.75 MG TB24 tablet Take 1.5 mg by mouth daily before breakfast. 10/01/24  Yes [provider]  HUMALOG KWIKPEN 100 UNIT/ML KwikPen Inject 3-18 Units into the skin 3 (three) times daily with meals. 02/16/22  Yes [provider]  LANTUS  SOLOSTAR 100 UNIT/ML Solostar Pen Inject 15 Units into the skin in the morning. 02/15/22  Yes [provider]  magnesium  oxide (MAG-OX) 400 (240 Mg) MG tablet Take 1,200 mg by mouth 3 (three) times daily with meals.   Yes [provider]  mycophenolate  (MYFORTIC ) 180 MG EC tablet Take 360 mg by mouth 2 (two) times daily. 07/11/24  Yes [provider]  NON FORMULARY CPAP at bedtime   Yes [provider]  pantoprazole  (PROTONIX ) 40 MG tablet Take 40 mg by mouth in the morning. 07/31/24  Yes [provider]  potassium chloride  SA (KLOR-CON  M) 20 MEQ tablet Take 20 mEq by mouth 2 (two) times daily.   Yes [provider]  predniSONE  (DELTASONE ) 5 MG tablet Take 5 mg by mouth daily with breakfast. Continuously   Yes [provider]  PREVYMIS  480 MG TABS Take 480 mg by mouth daily. 07/11/24  Yes [provider]  rosuvastatin  (CRESTOR ) 10 MG tablet Take 10 mg by mouth at bedtime.   Yes [provider]  sulfamethoxazole -trimethoprim  (BACTRIM ) 400-80 MG tablet Take 1 tablet by mouth daily.   Yes [provider]  torsemide  (DEMADEX ) 20 MG tablet Take 60 mg by mouth daily.   Yes [provider]  acetaminophen  (TYLENOL ) 500 MG tablet Take 1,000 mg by mouth every 6 (six) hours as needed for mild pain. Patient not taking: Reported on 10/13/2024    [provider]    Scheduled Meds:  apixaban   5 mg Oral BID   azithromycin   500 mg Oral Daily   carvedilol   37.5 mg Oral BID WC   furosemide   80 mg Intravenous BID   insulin  aspart  0-15 Units Subcutaneous TID WC   insulin  glargine-yfgn  10 Units  Subcutaneous Daily   magnesium  oxide  1,200 mg Oral TID WC   pantoprazole   40 mg Oral q AM   rosuvastatin   10 mg Oral QHS   Continuous Infusions:  cefTRIAXone  (ROCEPHIN )  IV Stopped (10/13/24 1358)   PRN Meds:  Allergies:    Allergies  Allergen Reactions   Bee Venom Swelling   Vancomycin  Other (See Comments)    Flushing - resolves when infusion is slow   Social History:   Social History   Socioeconomic History   Marital status: Married    Spouse name: Arlean   Number of children: Not on file   Years of education: Not on file   Highest education level: Doctorate  Occupational History   Occupation: Retired  Tobacco Use   Smoking status: Never   Smokeless tobacco: Never  Vaping Use   Vaping status: Never Used  Substance and Sexual Activity   Alcohol use: Never   Drug use: Never   Sexual activity: Not on file  Other Topics Concern   Not  on file  Social History Narrative   Not on file   Social Drivers of Health   Financial Resource Strain: Low Risk  (10/06/2024)   Received from [facility]   Overall Financial Resource Strain (CARDIA)    Difficulty of Paying Living Expenses: Not hard at all  Food Insecurity: No Food Insecurity (10/06/2024)   Received from [facility]   Hunger Vital Sign    Within the past 12 months, you worried that your food would run out before you got the money to buy more.: Never true    Within the past 12 months, the food you bought just didn't last and you didn't have money to get more.: Never true  Transportation Needs: No Transportation Needs (10/06/2024)   Received from [facility] - Transportation    In the past 12 months, has lack of transportation kept you from medical appointments or from getting medications?: No    Lack of Transportation (Non-Medical): No  Physical Activity: Insufficiently Active (06/04/2020)   Received from [facility]   Exercise Vital  Sign    On average, how many days per week do you engage in moderate to strenuous exercise (like a brisk walk)?: 3 days    On average, how many minutes do you engage in exercise at this level?: 30 min  Stress: No Stress Concern Present (06/04/2020)   Received from [facility] - Occupational Stress Questionnaire    Feeling of Stress : Not at all  Social Connections: Not on file  Intimate Partner Violence: Not on file    Family History:   History reviewed. No pertinent family history.   ROS:  Please see the history of present illness.  All other ROS reviewed and negative.     Physical Exam/Data: Vitals:   10/13/24 0545 10/13/24 0745 10/13/24 0900 10/13/24 1017  BP:    119/64  Pulse: 74   73  Resp: 18   (!) 22  Temp:  (!) 97.4 F (36.3 C) 97.7 F (36.5 C)   TempSrc:  Oral Oral   SpO2: 99%   98%  Weight:      Height:        Intake/Output Summary (Last 24 hours) at 10/13/2024 1528 Last data filed at 10/13/2024 0756 Gross per 24 hour  Intake 1400 ml  Output --  Net 1400 ml      10/13/2024    1:06 AM 02/19/2024   11:11 AM 04/01/2022    6:17 AM  Last 3 Weights  Weight (lbs) 178 lb 12.7 oz 197 lb 185 lb 3 oz  Weight (kg) 81.1 kg 89.359 kg 84 kg     Body mass index is 26.4 kg/m.   Physical exam Per MD  Telemetry:  Telemetry was personally reviewed and demonstrates:  sinus rhythm, HR 70s  Relevant CV Studies:  Echocardiogram, 10/13/2024 Ordered, pending results   Laboratory Data: High Sensitivity Troponin:  No results for input(s): TROPONINIHS in the last 720 hours.   Chemistry Recent Labs  Lab 10/13/24 0112 10/13/24 1030  NA 139  --   K 3.5  --   CL 101  --   CO2 24  --   GLUCOSE 127*  --   BUN 33*  --   CREATININE 3.47* 3.30*  CALCIUM  8.9  --   GFRNONAA 19* 20*  ANIONGAP 14  --     Recent Labs  Lab 10/13/24 0112  PROT 6.2*  ALBUMIN 3.1*  AST 14*  ALT 10  ALKPHOS 73  BILITOT 0.8    Lipids No results for input(s): CHOL, TRIG, HDL, LABVLDL, LDLCALC, CHOLHDL in the last 168 hours.  Hematology Recent Labs  Lab 10/13/24 0112 10/13/24 1030  WBC 10.2 8.0  RBC 3.45* 3.14*  HGB 9.1* 8.4*  HCT 29.2* 27.2*  MCV 84.6 86.6  MCH 26.4 26.8  MCHC 31.2 30.9  RDW 18.0* 18.1*  PLT 181 169   Thyroid No results for input(s): TSH, FREET4 in the last 168 hours.  BNP Recent Labs  Lab 10/13/24 0112  BNP 2,884.7*    DDimer No results for input(s): DDIMER in the last 168 hours.  Radiology/Studies:  US  Renal Transplant w/Doppler Result Date: 10/13/2024 CLINICAL DATA:  Acute renal insufficiency. EXAM: ULTRASOUND OF RENAL TRANSPLANT WITH RENAL DOPPLER ULTRASOUND TECHNIQUE: Ultrasound examination of the renal transplant was performed with gray-scale, color and duplex doppler evaluation. COMPARISON:  None Available. FINDINGS: Transplant kidney location: Right lower quadrant Transplant Kidney: Renal measurements: 11.7 x 7.8 x 6.2 cm = volume: . There is mild diffuse increased renal parenchymal echogenicity. There is moderate hydronephrosis of the transplant kidney with possible thickening of the urothelium of the collecting system versus debris. Correlation with urinalysis recommended to exclude pyelonephritis. No peritransplant fluid collection. There is a 2 cm upper pole cyst. Color flow in the main renal artery:  Yes Color flow in the main renal vein:  Yes Duplex Doppler Evaluation: Main Renal Artery Velocity: 77 cm/sec Main Renal Artery Resistive Index: 0.8 Venous waveform in main renal vein:  Present Intrarenal resistive index in upper pole:  0.65 (normal 0.6-0.8; equivocal 0.8-0.9; abnormal >= 0.9) Intrarenal resistive index in lower pole: 0.8 (normal 0.6-0.8; equivocal 0.8-0.9; abnormal >= 0.9) Bladder: Normal for degree of bladder distention. Other findings:  None. IMPRESSION: 1. Moderate hydronephrosis of the transplant kidney. Correlation with urinalysis  recommended to evaluate for pyelonephritis. 2. Borderline elevated resistive indices in the main renal artery and lower pole of the transplant kidney. Electronically Signed   By: Vanetta Chou M.D.   On: 10/13/2024 12:50   CT CHEST ABDOMEN PELVIS WO CONTRAST Result Date: 10/13/2024 EXAM: CT CHEST, ABDOMEN AND PELVIS WITHOUT CONTRAST 10/13/2024 07:28:08 AM TECHNIQUE: CT of the chest, abdomen and pelvis was performed without the administration of intravenous contrast. Multiplanar reformatted images are provided for review. Automated exposure control, iterative reconstruction, and/or weight based adjustment of the mA/kV was utilized to reduce the radiation dose to as low as reasonably achievable. COMPARISON: Chest CT 07/17/2005. CT abdomen and pelvis 07/23/2021. CLINICAL HISTORY: 68 year old male with sepsis, status post kidney transplant 5 months ago. FINDINGS: CHEST: MEDIASTINUM AND LYMPH NODES: Mild cardiomegaly. Trace pericardial effusion. Calcified coronary artery atherosclerosis. Mild to moderate calcified thoracic aortic atherosclerosis. The central airways are clear. Major airways are patent with mild generalized airway thickening. No mediastinal, hilar or axillary lymphadenopathy. LUNGS AND PLEURA: Trace left pleural effusion is layering. Small but partially loculated right pleural effusion (series 3 image 38) with peripheral right upper and middle lobe lung opacity which more resembles infection than atelectasis or scarring. Bilateral middle lobe atelectasis. Mild left lower lobe atelectasis adjacent to pleural effusion. No pneumothorax. ABDOMEN AND PELVIS: LIVER: Embolization coils at the porta hepatis are new since 2022. 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 Perihepatic free fluid is small to moderate in volume, has simple fluid density favoring transudate / ascites. GALLBLADDER AND BILE  DUCTS: The gallbladder is chronically abnormal, partially  contracted and with adjacent fat stranding which does not appear significantly changed from 2022. No biliary ductal dilatation. SPLEEN: Small volume perisplenic free fluid. PANCREAS: No acute abnormality. ADRENAL GLANDS: Negative adrenal glands. KIDNEYS, URETERS AND BLADDER: Previous bilateral nephrectomy. Abnormal right lower quadrant renal transplant, nonspecific non-contrast CT appearance which might reflect rejection or pararenal hematoma. There is an adjacent percutaneous pigtail drain along the lateral aspect of the transplant, as well as adjacent nonspecific free fluid and a fluid collection along the posterior and inferior margin of the transplant (series 3 image 107, unclear whether this is hydronephrosis). Moderate transplant perinephric stranding, also nonspecific. No transplant hydroureter. Fat stranding from adjacent to the right renal transplant also extends into the space of Retzius. Chronically atrophied left lower quadrant transplant since 2022. Stable appearance of the left renal transplant ureter. The urinary bladder is mildly distended, with a similar volume to the previous CT abdomen and pelvis. No stones in the kidneys or ureters. No hydronephrosis. No perinephric or periureteral stranding. GI AND BOWEL: Nondilated stomach. Chronic left lower quadrant large bowel anastomosis appears stable with no adverse features. Upstream diverticulosis of the descending colon with no active inflammation. Large bowel retained stool elsewhere. Normal gas containing appendix tracking to the midline on series 3 image 93. No dilated small bowel. There is no bowel obstruction. REPRODUCTIVE ORGANS: Prostatomegaly. Partially visible severe scrotal swelling, which appears to be related to right greater than left scrotal hydroceles with simple fluid density (series 4 image 16). PERITONEUM AND RETROPERITONEUM: Small to moderate volume nonspecific ascites in the abdomen and pelvis. No free air. VASCULATURE: Aorta is  normal in caliber. ABDOMINAL AND PELVIS LYMPH NODES: No lymphadenopathy. BONES AND SOFT TISSUES: Mild L4 lumbar spine degeneration except for advanced chronic disc and endplate degeneration at the lumbosacral junction. Underlying right ribs appear intact. No acute or suspicious osseous lesion identified in the chest. Postoperative changes to the ventral abdominal wall with no adverse features. Superimposed chronic fat containing right inguinal hernia. No herniated bowel. No focal soft tissue abnormality. IMPRESSION: 1. Right lower quadrant renal transplant with Abnormal but nonspecific non-contrast CT appearance. Considerations include rejection, pyelonephritis or even some renal hematoma. And there is an adjacent percutaneous drain, correlate with output. 2. Small to moderate volume nonspecific ascites in the abdomen and pelvis, appears to be simple fluid density. 3. Small but partially loculated right pleural effusion with peripheral right upper and middle lobe lung opacity that more resembles infection than atelectasis or scarring. 4. Small pericardial effusion. 5. Other chronic findings, including bilateral native nephrectomy, atrophied left lower quadrant renal transplant . Electronically signed by: Helayne Hurst MD 10/13/2024 07:46 AM EST RP Workstation: [id number]   DG Chest Portable 1 View Result Date: 10/13/2024 EXAM: 1 VIEW(S) XRAY OF THE CHEST 10/13/2024 02:12:00 AM COMPARISON: 04/05/2022 CLINICAL HISTORY: Fevers FINDINGS: LUNGS AND PLEURA: Linear scarring is again seen in the left mid lung with associated suture line. No pleural effusion. No pneumothorax. HEART AND MEDIASTINUM: Cardiomegaly. Calcified aorta. BONES AND SOFT TISSUES: No acute osseous abnormality. IMPRESSION: 1. No acute cardiopulmonary process. Electronically signed by: Oneil Devonshire MD 10/13/2024 02:32 AM EST RP Workstation: [id number]   Assessment and Plan:  Chronic HFrEF Hypertension  Recent echo at [facility] showed LVEF 30% Pending  updated echo here  Home meds: Jardiance  10 mg daily, Coreg  37.5 mg BID, torsemide  60 mg daily Defer MRA or ACE/ARB/ARNI with renal failure  Continue to add back home GDMT as tolerated Does not  appear significantly volume overloaded, will discuss diuresis with nephrology   Polycystic kidney disease  S/p recent transplant at [facility] 06/2024 Fever  Presented with fever 104.31F  Reported fever, chills, weakness since Saturday  Activated EMS and was brought to [facility] to see while inpatient  Low threshold to transfer to [facility] for further care, defer to primary   Paroxysmal A-Fib History of SVT Noted to be in sinus rhythm presently, HR 70s Home meds: Eliquis  5 mg BID, Coreg  37.5 mg BID Continue home meds  Per primary 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 The patient's score is based upon: CHF History: 1 HTN History: 1 Diabetes History: 1 Stroke History: 0 Vascular Disease History: 0 Age Score: 1 Gender Score: 0        For questions or updates, please contact [facility] Please consult [email or web address] for contact info under   Signed, Waddell DELENA Donath, PA-C  10/13/2024 3:28 PM

## 2024-10-13 NOTE — Consult Note (Signed)
 Reason for Consult: Acute kidney injury Referring Physician: Marsha Ada, MD [facility])  HPI:  68 year old man with past medical history significant for autosomal dominant polycystic kidney disease status post living donor kidney transplant Oct 24, 2005 followed by deceased donor kidney transplant on 07/10/2024 (Thymoglobulin induction) in [place]  (posttransplant creatinine nadir 2.0-2.2).  He previously has had bilateral native nephrectomies and a history of metastatic sarcoma in remission complicated by congestive heart failure with reduced ejection fraction (EF currently 30%)-previously low due to anthracycline related cardiomyopathy but had recovered to EF 50%.  He was hospitalized at [facility] from 11/14 - 10/08/2024 for the management of acute exacerbation of congestive heart failure complicated by acute kidney injury.  He had a renal allograft biopsy on 09/29/2024 that showed no evidence of rejection (negative C4d) and was positive for ATN with calcium  phosphate crystals and atherosclerotic changes with 25% interstitial fibrosis.  On 09/30/2024 he also had a CT-guided drain placement by IR for seroma drainage (no evidence of urinoma) that remains in situ with serous output.  He is on immunosuppression with Envarsus  1.5 mg daily (goal 5-7), MMF 360 mg every 12 hours and prednisone  5 mg daily with Bactrim  SS once a day for PJP prophylaxis and Letermovir  480 mg for CMV prophylaxis.  He was discharged on torsemide  60 mg daily that he has been taking as prescribed.  He had a renal transplant with Doppler ultrasound that showed moderate hydronephrosis of the transplanted kidney with borderline elevated resistive indices.  He has been nonoliguric over the past few several days with urine output ranging 2.0-2.6 L.  Upon presentation to the emergency room, noted to be febrile with a temperature of 104.9 and has elevated procalcitonin level.  Past Medical History:  Diagnosis Date   CHF  (congestive heart failure) (HCC) 10/18/2018   Hospitalized at the [facility]    Diabetes mellitus without complication (HCC)    GI bleed 07/23/2021   Hypertension    Melanoma (HCC) 1997, [id number], 03/2015   Polycystic kidney disease 06/11/2012    Past Surgical History:  Procedure Laterality Date   nephrectomy  05/29/2005   bilateral native   s/p kidney transplantation  08/14/2005   S/P livning donor kidney transplantation    History reviewed. No pertinent family history.  Social History:  reports that he has never smoked. He has never used smokeless tobacco. He reports that he does not drink alcohol and does not use drugs.  Allergies:  Allergies  Allergen Reactions   Bee Venom Swelling   Vancomycin  Other (See Comments)    Flushing - resolves when infusion is slow    Medications: I have reviewed the patient's current medications. Scheduled:  apixaban   5 mg Oral BID   azithromycin   500 mg Oral Daily   carvedilol   37.5 mg Oral BID WC   furosemide   80 mg Intravenous BID   insulin  aspart  0-15 Units Subcutaneous TID WC   insulin  glargine-yfgn  10 Units Subcutaneous Daily   magnesium  oxide  1,200 mg Oral TID WC   pantoprazole   40 mg Oral q AM   rosuvastatin   10 mg Oral QHS   Continuous:  cefTRIAXone  (ROCEPHIN )  IV Stopped (10/13/24 1358)      Latest Ref Rng & Units 10/13/2024   10:30 AM 10/13/2024    1:12 AM 04/01/2022   10:06 AM  BMP  Glucose 70 - 99 mg/dL  872  799   BUN 8 - 23 mg/dL  33  40   Creatinine  0.61 - 1.24 mg/dL 6.69  6.52  5.40   Sodium 135 - 145 mmol/L  139  135   Potassium 3.5 - 5.1 mmol/L  3.5  3.7   Chloride 98 - 111 mmol/L  101  96   CO2 22 - 32 mmol/L  24  27   Calcium  8.9 - 10.3 mg/dL  8.9  8.2       Latest Ref Rng & Units 10/13/2024   10:30 AM 10/13/2024    1:12 AM 04/01/2022   10:06 AM  CBC  WBC 4.0 - 10.5 K/uL 8.0  10.2  12.7   Hemoglobin 13.0 - 17.0 g/dL 8.4  9.1  89.3   Hematocrit 39.0 - 52.0 % 27.2  29.2  30.9   Platelets  150 - 400 K/uL 169  181  120   Urinalysis    Component Value Date/Time   COLORURINE YELLOW 10/13/2024 0114   APPEARANCEUR CLEAR 10/13/2024 0114   LABSPEC 1.012 10/13/2024 0114   PHURINE 7.0 10/13/2024 0114   GLUCOSEU >=500 (A) 10/13/2024 0114   HGBUR SMALL (A) 10/13/2024 0114   BILIRUBINUR NEGATIVE 10/13/2024 0114   KETONESUR NEGATIVE 10/13/2024 0114   PROTEINUR 100 (A) 10/13/2024 0114   NITRITE NEGATIVE 10/13/2024 0114   LEUKOCYTESUR NEGATIVE 10/13/2024 0114    US  Renal Transplant w/Doppler Result Date: 10/13/2024 CLINICAL DATA:  Acute renal insufficiency. EXAM: ULTRASOUND OF RENAL TRANSPLANT WITH RENAL DOPPLER ULTRASOUND TECHNIQUE: Ultrasound examination of the renal transplant was performed with gray-scale, color and duplex doppler evaluation. COMPARISON:  None Available. FINDINGS: Transplant kidney location: Right lower quadrant Transplant Kidney: Renal measurements: 11.7 x 7.8 x 6.2 cm = volume: . There is mild diffuse increased renal parenchymal echogenicity. There is moderate hydronephrosis of the transplant kidney with possible thickening of the urothelium of the collecting system versus debris. Correlation with urinalysis recommended to exclude pyelonephritis. No peritransplant fluid collection. There is a 2 cm upper pole cyst. Color flow in the main renal artery:  Yes Color flow in the main renal vein:  Yes Duplex Doppler Evaluation: Main Renal Artery Velocity: 77 cm/sec Main Renal Artery Resistive Index: 0.8 Venous waveform in main renal vein:  Present Intrarenal resistive index in upper pole:  0.65 (normal 0.6-0.8; equivocal 0.8-0.9; abnormal >= 0.9) Intrarenal resistive index in lower pole: 0.8 (normal 0.6-0.8; equivocal 0.8-0.9; abnormal >= 0.9) Bladder: Normal for degree of bladder distention. Other findings:  None. IMPRESSION: 1. Moderate hydronephrosis of the transplant kidney. Correlation with urinalysis recommended to evaluate for pyelonephritis. 2. Borderline elevated  resistive indices in the main renal artery and lower pole of the transplant kidney. Electronically Signed   By: Vanetta Chou M.D.   On: 10/13/2024 12:50   CT CHEST ABDOMEN PELVIS WO CONTRAST Result Date: 10/13/2024 EXAM: CT CHEST, ABDOMEN AND PELVIS WITHOUT CONTRAST 10/13/2024 07:28:08 AM TECHNIQUE: CT of the chest, abdomen and pelvis was performed without the administration of intravenous contrast. Multiplanar reformatted images are provided for review. Automated exposure control, iterative reconstruction, and/or weight based adjustment of the mA/kV was utilized to reduce the radiation dose to as low as reasonably achievable. COMPARISON: Chest CT 07/17/2005. CT abdomen and pelvis 07/23/2021. CLINICAL HISTORY: 68 year old male with sepsis, status post kidney transplant 5 months ago. FINDINGS: CHEST: MEDIASTINUM AND LYMPH NODES: Mild cardiomegaly. Trace pericardial effusion. Calcified coronary artery atherosclerosis. Mild to moderate calcified thoracic aortic atherosclerosis. The central airways are clear. Major airways are patent with mild generalized airway thickening. No mediastinal, hilar or axillary lymphadenopathy. LUNGS AND PLEURA: Trace  left pleural effusion is layering. Small but partially loculated right pleural effusion (series 3 image 38) with peripheral right upper and middle lobe lung opacity which more resembles infection than atelectasis or scarring. Bilateral middle lobe atelectasis. Mild left lower lobe atelectasis adjacent to pleural effusion. No pneumothorax. ABDOMEN AND PELVIS: LIVER: Embolization coils at the porta hepatis are new since 2022. 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 Perihepatic free fluid is small to moderate in volume, has simple fluid density favoring transudate / ascites. GALLBLADDER AND BILE DUCTS: The gallbladder is chronically abnormal, partially contracted and with adjacent fat stranding which does not appear  significantly changed from 2022. No biliary ductal dilatation. SPLEEN: Small volume perisplenic free fluid. PANCREAS: No acute abnormality. ADRENAL GLANDS: Negative adrenal glands. KIDNEYS, URETERS AND BLADDER: Previous bilateral nephrectomy. Abnormal right lower quadrant renal transplant, nonspecific non-contrast CT appearance which might reflect rejection or pararenal hematoma. There is an adjacent percutaneous pigtail drain along the lateral aspect of the transplant, as well as adjacent nonspecific free fluid and a fluid collection along the posterior and inferior margin of the transplant (series 3 image 107, unclear whether this is hydronephrosis). Moderate transplant perinephric stranding, also nonspecific. No transplant hydroureter. Fat stranding from adjacent to the right renal transplant also extends into the space of Retzius. Chronically atrophied left lower quadrant transplant since 2022. Stable appearance of the left renal transplant ureter. The urinary bladder is mildly distended, with a similar volume to the previous CT abdomen and pelvis. No stones in the kidneys or ureters. No hydronephrosis. No perinephric or periureteral stranding. GI AND BOWEL: Nondilated stomach. Chronic left lower quadrant large bowel anastomosis appears stable with no adverse features. Upstream diverticulosis of the descending colon with no active inflammation. Large bowel retained stool elsewhere. Normal gas containing appendix tracking to the midline on series 3 image 93. No dilated small bowel. There is no bowel obstruction. REPRODUCTIVE ORGANS: Prostatomegaly. Partially visible severe scrotal swelling, which appears to be related to right greater than left scrotal hydroceles with simple fluid density (series 4 image 16). PERITONEUM AND RETROPERITONEUM: Small to moderate volume nonspecific ascites in the abdomen and pelvis. No free air. VASCULATURE: Aorta is normal in caliber. ABDOMINAL AND PELVIS LYMPH NODES: No  lymphadenopathy. BONES AND SOFT TISSUES: Mild L4 lumbar spine degeneration except for advanced chronic disc and endplate degeneration at the lumbosacral junction. Underlying right ribs appear intact. No acute or suspicious osseous lesion identified in the chest. Postoperative changes to the ventral abdominal wall with no adverse features. Superimposed chronic fat containing right inguinal hernia. No herniated bowel. No focal soft tissue abnormality. IMPRESSION: 1. Right lower quadrant renal transplant with Abnormal but nonspecific non-contrast CT appearance. Considerations include rejection, pyelonephritis or even some renal hematoma. And there is an adjacent percutaneous drain, correlate with output. 2. Small to moderate volume nonspecific ascites in the abdomen and pelvis, appears to be simple fluid density. 3. Small but partially loculated right pleural effusion with peripheral right upper and middle lobe lung opacity that more resembles infection than atelectasis or scarring. 4. Small pericardial effusion. 5. Other chronic findings, including bilateral native nephrectomy, atrophied left lower quadrant renal transplant . Electronically signed by: Helayne Hurst MD 10/13/2024 07:46 AM EST RP Workstation: [id number]   DG Chest Portable 1 View Result Date: 10/13/2024 EXAM: 1 VIEW(S) XRAY OF THE CHEST 10/13/2024 02:12:00 AM COMPARISON: 04/05/2022 CLINICAL HISTORY: Fevers FINDINGS: LUNGS AND PLEURA: Linear scarring is again seen in the left mid lung with associated suture  line. No pleural effusion. No pneumothorax. HEART AND MEDIASTINUM: Cardiomegaly. Calcified aorta. BONES AND SOFT TISSUES: No acute osseous abnormality. IMPRESSION: 1. No acute cardiopulmonary process. Electronically signed by: Oneil Devonshire MD 10/13/2024 02:32 AM EST RP Workstation: [id number]    Review of Systems  Constitutional:  Positive for chills, fatigue and fever.  HENT:  Positive for postnasal drip. Negative for nosebleeds, sinus  pressure, sinus pain, sore throat and trouble swallowing.   Eyes:  Negative for redness and visual disturbance.  Respiratory:  Positive for cough. Negative for chest tightness, shortness of breath and wheezing.   Cardiovascular:  Negative for chest pain and leg swelling.  Gastrointestinal:  Negative for abdominal pain, constipation, diarrhea and nausea.  Genitourinary:  Positive for scrotal swelling. Negative for dysuria, flank pain and hematuria.       Chronic scrotal swelling, hydrocele  Musculoskeletal:  Negative for arthralgias and myalgias.  Skin:  Negative for pallor and rash.  Neurological:  Positive for tremors. Negative for headaches.   Blood pressure 119/64, pulse 73, temperature 97.7 F (36.5 C), temperature source Oral, resp. rate (!) 22, height 5' 9 (1.753 m), weight 81.1 kg, SpO2 98%. Physical Exam Vitals and nursing note reviewed.  Constitutional:      General: He is not in acute distress.    Appearance: Normal appearance. He is normal weight. He is not ill-appearing or toxic-appearing.  HENT:     Head: Normocephalic and atraumatic.     Right Ear: External ear normal.     Left Ear: External ear normal.     Nose: Nose normal.     Mouth/Throat:     Mouth: Mucous membranes are dry.     Pharynx: Oropharynx is clear. No oropharyngeal exudate.  Eyes:     Extraocular Movements: Extraocular movements intact.     Conjunctiva/sclera: Conjunctivae normal.  Cardiovascular:     Rate and Rhythm: Normal rate and regular rhythm.     Pulses: Normal pulses.     Heart sounds: Normal heart sounds.  Pulmonary:     Effort: Pulmonary effort is normal.     Breath sounds: Rales present.     Comments: Fine rales both lung bases Abdominal:     General: Bowel sounds are normal. There is distension.     Palpations: Abdomen is soft.     Tenderness: There is no abdominal tenderness. There is no guarding.     Comments: Mild to moderate distention, truncal obesity  Musculoskeletal:      Cervical back: Normal range of motion and neck supple.     Right lower leg: Edema present.     Left lower leg: Edema present.     Comments: Trace right lower extremity edema, trace-1+ left lower extremity edema with compression stocking  Skin:    General: Skin is warm and dry.     Coloration: Skin is pale.  Neurological:     Mental Status: He is alert and oriented to person, place, and time. Mental status is at baseline.  Psychiatric:        Behavior: Behavior normal.     Assessment/Plan: 1.  Acute kidney injury on chronic kidney disease stage IIIb-T: Baseline creatinine established after renal transplant appears to have been around 2.0-2.2 and more recently has been around 2.6 following recent admission for management of congestive heart failure exacerbation.  Currently creatinine much higher at 3.5 without any acute electrolyte abnormality.  He does have some volume excess on imaging/exam but this seems to be much improved since recent  hospitalization when he was transitioned to a higher dose of torsemide  60 mg daily.  I suspect that his acute kidney injury may be multifactorial in the setting of recent febrile illness and possible obstruction/pressure effect seen with renal artery Doppler.  Transition to intravenous diuresis and continue to monitor renal function while working up/managing fever.  Continue current immunosuppression with Envarsus /MMF/prednisone .  Avoid nephrotoxic medications including NSAIDs and iodinated intravenous contrast exposure unless the latter is absolutely indicated.  Preferred narcotic agents for pain control are hydromorphone, fentanyl, and methadone. Morphine should not be used. Avoid Baclofen and avoid oral sodium phosphate  and magnesium  citrate based laxatives / bowel preps. Continue strict Input and Output monitoring. Will monitor the patient closely with you and intervene or adjust therapy as indicated by changes in clinical status/labs. 2.  Right upper lobe  pneumonia: Started on treatment with antibiotics for presumed community-acquired pneumonia based on suspicious finding on CT imaging.   Recent CMV PCR negative.  Check urine cultures and blood culture. 3.  Congestive heart failure with reduced ejection fraction: Without evidence of florid volume overload however, given evidence of third spacing on imaging, would favor conversion to intravenous furosemide  40 mg IV twice daily (slightly higher than his usual torsemide  60 mg dose). 4.  Anemia: Secondary to acute illness, recent hospitalization/frequent blood draws in this patient who is only 3 months status post renal transplant.  Will continue to follow trend to decide on need for additional testing/intervention.   Nicholas Glenn 10/13/2024, 2:33 PM

## 2024-10-13 NOTE — ED Notes (Signed)
 Refused lab draw for cultures or lactic acid

## 2024-10-13 NOTE — Progress Notes (Signed)
 NEW ADMISSION NOTE New Admission Note:   Arrival Method: walked from stretcher to chair and sat up Mental Orientation: alert and oriented x 4, wife present Telemetry: n/a Assessment: Completed Skin: intact IV: see LDA Pain: denies Tubes: drain placed by IR on 09/30/24 for seroma on R flank with serous fluid Safety Measures: Safety Fall Prevention Plan has been given, discussed and signed Admission: Completed [facility] Orientation: Patient has been orientated to the room, unit and staff.    Orders have been reviewed and implemented. Will continue to monitor the patient. Call light has been placed within reach and bed alarm has been activated.   Toney Difatta A Proctor-Gann, RN

## 2024-10-13 NOTE — H&P (Addendum)
 History and Physical    Patient: Nicholas Glenn FMW:[id number] DOB: 1955/12/29 DOA: 10/13/2024 DOS: the patient was seen and examined on 10/13/2024 PCP: Marilynne Lenis, MD  Patient coming from: Home  Chief Complaint:  Chief Complaint  Patient presents with   Fever   Weakness   HPI: Nicholas Glenn is a 68 y.o. 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  The patient presented with fever, chills, and weakness that began two days prior to the hospital visit. The patient's wife reported that on Saturday morning, the patient complained of feeling hot but did not have a fever until later. The patient felt really tired and went to bed. The next day, the symptoms improved, but by bedtime, the patient was observed to be shaking, indicative of rigors, and developed a fever with difficulty standing due to weakness that prompted the current ED visit.  In the ED, pt febrile to 104.9/102.9 deg F, tachycardic, and tachypneic w/o hypoxia. Labs notable for Cr 3.47-->3.3, lactic acid 1.7, and BNP 2884 (previously 4600 on admisison to [facility] per wife in 09/2024). EDP adminstered Iv vancomycin /cefepime  x1, and requested admission for FUO, AKI, and HFrEF exacerbation.   Review of Systems: As mentioned in the history of present illness. All other systems reviewed and are negative. Past Medical History:  Diagnosis Date   CHF (congestive heart failure) (HCC) 10/18/2018   Hospitalized at the [facility] [place]    Diabetes mellitus without complication (HCC)    GI bleed 07/23/2021   Hypertension     Melanoma (HCC) 1997, 7986,7985, 03/2015   Polycystic kidney disease 06/11/2012   Past Surgical History:  Procedure Laterality Date   nephrectomy  05/29/2005   bilateral native   s/p kidney transplantation  08/14/2005   S/P livning donor kidney transplantation   Social History:  reports that he has never smoked. He has never used smokeless tobacco. He reports that he does not drink alcohol and does not use drugs.  Allergies  Allergen Reactions   Bee Venom Swelling   Vancomycin  Other (See Comments)    Flushing - resolves when infusion is slow    History reviewed. No pertinent family history.  Prior to Admission medications   Medication Sig Start Date End Date Taking? Authorizing Provider  apixaban  (ELIQUIS ) 5 MG TABS tablet Take 5 mg by mouth 2 (two) times daily.   Yes [provider]  carvedilol  (COREG ) 12.5 MG tablet Take 37.5 mg by mouth 2 (two) times daily with a meal.   Yes [provider]  cyanocobalamin (VITAMIN B12) 1000 MCG tablet Take 1,000 mcg by mouth every morning.   Yes [provider]  doxercalciferol  (HECTOROL ) 0.5 MCG capsule Take 0.5 mcg by mouth every Monday, Wednesday, and Friday.   Yes [provider]  empagliflozin  (JARDIANCE ) 25 MG TABS tablet Take 12.5 mg by mouth daily. 10/07/24  Yes [provider]  ENVARSUS  XR 0.75 MG TB24 tablet Take 1.5 mg by mouth daily before breakfast. 10/01/24  Yes [provider]  HUMALOG KWIKPEN 100 UNIT/ML KwikPen Inject 3-18 Units into the skin 3 (three) times daily with meals. 02/16/22  Yes [provider]  LANTUS  SOLOSTAR 100 UNIT/ML Solostar Pen Inject 15 Units into the skin in the morning. 02/15/22  Yes [provider]  magnesium  oxide (MAG-OX) 400 (240 Mg) MG tablet Take 1,200 mg by mouth 3 (three) times daily with meals.   Yes [provider]  mycophenolate  (MYFORTIC ) 180 MG EC tablet Take 360 mg by mouth 2 (two) times daily. 07/11/24  Yes [provider]  NON FORMULARY CPAP at bedtime   Yes [provider]  pantoprazole  (PROTONIX ) 40 MG tablet Take 40 mg by mouth in the morning. 07/31/24  Yes [provider]  potassium chloride  SA (KLOR-CON  M) 20 MEQ tablet Take 20 mEq by mouth 2 (two) times daily.   Yes [provider]  predniSONE  (DELTASONE ) 5 MG tablet Take 5 mg by mouth daily with breakfast. Continuously   Yes [provider]  PREVYMIS  480 MG TABS Take 480 mg by mouth daily. 07/11/24  Yes [provider]  rosuvastatin  (CRESTOR ) 10 MG tablet Take 10 mg by mouth at bedtime.   Yes [provider]  sulfamethoxazole -trimethoprim  (BACTRIM ) 400-80 MG tablet Take 1 tablet by mouth daily.   Yes [provider]  torsemide  (DEMADEX ) 20 MG tablet Take 60 mg by mouth daily.   Yes [provider]  acetaminophen  (TYLENOL ) 500 MG tablet Take 1,000 mg by mouth every 6 (six) hours as needed for mild pain. Patient not taking: Reported on 10/13/2024    [provider]    Physical Exam: Vitals:   10/13/24 0155 10/13/24 0224 10/13/24 0256 10/13/24 0545  BP: 107/68     Pulse: (!) 103  93 74  Resp: (!) 25  17 18   Temp:  (!) 102.9 F (39.4 C)    TempSrc:      SpO2: 97%  98% 99%  Weight:      Height:       General: Alert, oriented x3, resting comfortably in no acute distress Respiratory: Bibasilar rales; RUL rhonchi; no wheezing Cardiovascular: Regular rate and rhythm w/o m/r/g   Data Reviewed:  Lab Results  Component Value Date   WBC 8.0 10/13/2024   HGB 8.4 (L) 10/13/2024   HCT 27.2 (L) 10/13/2024   MCV 86.6 10/13/2024   PLT 169 10/13/2024   Lab Results  Component Value Date   GLUCOSE 127 (H) 10/13/2024   CALCIUM  8.9 10/13/2024   NA 139 10/13/2024   K 3.5 10/13/2024   CO2 24 10/13/2024   CL 101 10/13/2024   BUN 33 (H) 10/13/2024   CREATININE 3.30 (H) 10/13/2024   Lab Results  Component Value Date   ALT 10 10/13/2024   AST 14 (L) 10/13/2024    ALKPHOS 73 10/13/2024   BILITOT 0.8 10/13/2024   Lab Results  Component Value Date   INR 1.3 (H) 07/23/2021   INR 1.3 (H) 03/09/2021   Radiology: US  Renal Transplant w/Doppler Result Date: 10/13/2024 CLINICAL DATA:  Acute renal insufficiency. EXAM: ULTRASOUND OF RENAL TRANSPLANT WITH RENAL DOPPLER ULTRASOUND TECHNIQUE: Ultrasound examination of the renal transplant was performed with gray-scale, color and duplex doppler evaluation. COMPARISON:  None Available. FINDINGS: Transplant kidney location: Right lower quadrant Transplant Kidney: Renal measurements: 11.7 x 7.8 x 6.2 cm = volume: . There is mild diffuse increased renal parenchymal echogenicity. There is moderate hydronephrosis of the transplant kidney with possible thickening of the urothelium of the collecting system versus debris. Correlation with urinalysis recommended to exclude pyelonephritis. No peritransplant fluid collection. There is a 2 cm upper pole  cyst. Color flow in the main renal artery:  Yes Color flow in the main renal vein:  Yes Duplex Doppler Evaluation: Main Renal Artery Velocity: 77 cm/sec Main Renal Artery Resistive Index: 0.8 Venous waveform in main renal vein:  Present Intrarenal resistive index in upper pole:  0.65 (normal 0.6-0.8; equivocal 0.8-0.9; abnormal >= 0.9) Intrarenal resistive index in lower pole: 0.8 (normal 0.6-0.8; equivocal 0.8-0.9; abnormal >= 0.9) Bladder: Normal for degree of bladder distention. Other findings:  None. IMPRESSION: 1. Moderate hydronephrosis of the transplant kidney. Correlation with urinalysis recommended to evaluate for pyelonephritis. 2. Borderline elevated resistive indices in the main renal artery and lower pole of the transplant kidney. Electronically Signed   By: Vanetta Chou M.D.   On: 10/13/2024 12:50   CT CHEST ABDOMEN PELVIS WO CONTRAST Result Date: 10/13/2024 EXAM: CT CHEST, ABDOMEN AND PELVIS WITHOUT CONTRAST 10/13/2024 07:28:08 AM TECHNIQUE: CT of the chest,  abdomen and pelvis was performed without the administration of intravenous contrast. Multiplanar reformatted images are provided for review. Automated exposure control, iterative reconstruction, and/or weight based adjustment of the mA/kV was utilized to reduce the radiation dose to as low as reasonably achievable. COMPARISON: Chest CT 07/17/2005. CT abdomen and pelvis 07/23/2021. CLINICAL HISTORY: 68 year old male with sepsis, status post kidney transplant 5 months ago. FINDINGS: CHEST: MEDIASTINUM AND LYMPH NODES: Mild cardiomegaly. Trace pericardial effusion. Calcified coronary artery atherosclerosis. Mild to moderate calcified thoracic aortic atherosclerosis. The central airways are clear. Major airways are patent with mild generalized airway thickening. No mediastinal, hilar or axillary lymphadenopathy. LUNGS AND PLEURA: Trace left pleural effusion is layering. Small but partially loculated right pleural effusion (series 3 image 38) with peripheral right upper and middle lobe lung opacity which more resembles infection than atelectasis or scarring. Bilateral middle lobe atelectasis. Mild left lower lobe atelectasis adjacent to pleural effusion. No pneumothorax. ABDOMEN AND PELVIS: LIVER: Embolization coils at the porta hepatis are new since 2022. 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 Perihepatic free fluid is small to moderate in volume, has simple fluid density favoring transudate / ascites. GALLBLADDER AND BILE DUCTS: The gallbladder is chronically abnormal, partially contracted and with adjacent fat stranding which does not appear significantly changed from 2022. No biliary ductal dilatation. SPLEEN: Small volume perisplenic free fluid. PANCREAS: No acute abnormality. ADRENAL GLANDS: Negative adrenal glands. KIDNEYS, URETERS AND BLADDER: Previous bilateral nephrectomy. Abnormal right lower quadrant renal transplant, nonspecific non-contrast CT appearance  which might reflect rejection or pararenal hematoma. There is an adjacent percutaneous pigtail drain along the lateral aspect of the transplant, as well as adjacent nonspecific free fluid and a fluid collection along the posterior and inferior margin of the transplant (series 3 image 107, unclear whether this is hydronephrosis). Moderate transplant perinephric stranding, also nonspecific. No transplant hydroureter. Fat stranding from adjacent to the right renal transplant also extends into the space of Retzius. Chronically atrophied left lower quadrant transplant since 2022. Stable appearance of the left renal transplant ureter. The urinary bladder is mildly distended, with a similar volume to the previous CT abdomen and pelvis. No stones in the kidneys or ureters. No hydronephrosis. No perinephric or periureteral stranding. GI AND BOWEL: Nondilated stomach. Chronic left lower quadrant large bowel anastomosis appears stable with no adverse features. Upstream diverticulosis of the descending colon with no active inflammation. Large bowel retained stool elsewhere. Normal gas containing appendix tracking to the midline on series 3 image 93. No dilated small bowel. There is no bowel obstruction. REPRODUCTIVE  ORGANS: Prostatomegaly. Partially visible severe scrotal swelling, which appears to be related to right greater than left scrotal hydroceles with simple fluid density (series 4 image 16). PERITONEUM AND RETROPERITONEUM: Small to moderate volume nonspecific ascites in the abdomen and pelvis. No free air. VASCULATURE: Aorta is normal in caliber. ABDOMINAL AND PELVIS LYMPH NODES: No lymphadenopathy. BONES AND SOFT TISSUES: Mild L4 lumbar spine degeneration except for advanced chronic disc and endplate degeneration at the lumbosacral junction. Underlying right ribs appear intact. No acute or suspicious osseous lesion identified in the chest. Postoperative changes to the ventral abdominal wall with no adverse features.  Superimposed chronic fat containing right inguinal hernia. No herniated bowel. No focal soft tissue abnormality. IMPRESSION: 1. Right lower quadrant renal transplant with Abnormal but nonspecific non-contrast CT appearance. Considerations include rejection, pyelonephritis or even some renal hematoma. And there is an adjacent percutaneous drain, correlate with output. 2. Small to moderate volume nonspecific ascites in the abdomen and pelvis, appears to be simple fluid density. 3. Small but partially loculated right pleural effusion with peripheral right upper and middle lobe lung opacity that more resembles infection than atelectasis or scarring. 4. Small pericardial effusion. 5. Other chronic findings, including bilateral native nephrectomy, atrophied left lower quadrant renal transplant . Electronically signed by: Helayne Hurst MD 10/13/2024 07:46 AM EST RP Workstation: [id number]   DG Chest Portable 1 View Result Date: 10/13/2024 EXAM: 1 VIEW(S) XRAY OF THE CHEST 10/13/2024 02:12:00 AM COMPARISON: 04/05/2022 CLINICAL HISTORY: Fevers FINDINGS: LUNGS AND PLEURA: Linear scarring is again seen in the left mid lung with associated suture line. No pleural effusion. No pneumothorax. HEART AND MEDIASTINUM: Cardiomegaly. Calcified aorta. BONES AND SOFT TISSUES: No acute osseous abnormality. IMPRESSION: 1. No acute cardiopulmonary process. 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  AKI CT abdomen with contrast  enhancement of renal transplant c/f rejection vs pyelonephritis -Renal consulted; apprec eval/recs -Diuretics per below -Strict I&Os and daily weights (standing preferred) -F/u PVR to r/o post-renal obstruction -F/u BMP daily -Renally dose medications for CrCl -Avoid lovenox , NSAIDs, morphine, Fleet's phosphate enema, regular insulin , contrast; no gadolinium for MRI to avoid nephrogenic systemic fibrosis -F/u renal US   RUL CAP CT chest w/ small but partially loculated right pleural effusion with peripheral right upper and middle lobe lung opacity that more resembles infection than atelectasis or scarring -IV CTX 1g daily to complete 5 day CAP course -PO azithromycin  500mg  daily to complete 3 day CAP course -Duonebs prn -Wean O2 as tolerated -Ambulatory pulse ox prior to d/c  HFrEF exacerbation Newly diagnosed on last admission to [facility] 10 days ago; started on torsemide  60mg  daily, which pt believes is causing worsening Cr -Cards consulted; apprec eval/recs -S/p PO torsemide  60mg  x1 (pt took home meds w/o MD permission) -IV lasix  80mg  BID for now; goal net neg 1-2L/d; strict I/Os; daily standing weights; K>4/Mg>2  Afib -PTA Coreg  37.5mg  BID and apixaban  5mg  BID  DM2 -Semglee  10U daily and SSI TID AC prn  HLD -Crestor  10mg  daily   Advance Care Planning:  Code Status: Full Code   Consults: Renal and Cards  Family Communication: Jenkins Boers  Severity of Illness: The appropriate patient status for this patient is INPATIENT. Inpatient status is judged to be reasonable and necessary in order to provide the required intensity of service to ensure the patient's safety. The patient's presenting symptoms, physical exam findings, and initial radiographic and laboratory data in the context of their chronic comorbidities is felt to place them at high risk for further clinical deterioration. Furthermore, it is not anticipated that the patient will be medically stable for discharge from the  hospital within 2 midnights of admission.   * I certify that at the point of admission it is my clinical judgment that the patient will require inpatient hospital care spanning beyond 2 midnights from the point of admission due to high intensity of service, high risk for further deterioration and high frequency of surveillance required.*  ------- I spent 56 minutes reviewing previous notes, at the bedside counseling/discussing the treatment plan, and performing clinical documentation.  Author: Marsha Ada, MD 10/13/2024 7:35 AM  For on call review [email or web address].

## 2024-10-13 NOTE — ED Notes (Signed)
 Echo at bedside

## 2024-10-13 NOTE — ED Notes (Signed)
Pt refused to put on a gown 

## 2024-10-13 NOTE — ED Notes (Signed)
 Family at bedside.

## 2024-10-13 NOTE — Progress Notes (Signed)
 [facility]  EXPEDITER LEVEL LOADING ASSESSMENT NOTE  Patient Name: [name]  DOB:1956/09/28 Date of Admission: 10/13/2024  Date of Assessment:10/13/24   -------------------------------------------------------------------------------------------------------------------   Brief clinical summary: 68 yo male with PMH of  ADPKD s/p LDKT and DDKT.  Is there Bed Availability at another [facility]? Yes  If yes, what facility: [facility]  Level of Care Needed:  No  MD Agree to transfer: N/A  Patient agrees to transfer: N/A    -------------------------------------------------------------------------------------------------------------------  [facility] RN Expediter, Sharolyn JONETTA Batman Please contact us  directly via secure chat (search for [facility]) or by calling us  at [phone] [facility]).

## 2024-10-13 NOTE — ED Provider Notes (Addendum)
 [place] EMERGENCY DEPARTMENT AT [facility] Provider Note   CSN: [id number] Arrival date & time: 10/13/24  0104     History Chief Complaint  Patient presents with  . Fever  . Weakness    HPI Nicholas Glenn is a 68 y.o. male presenting for fevers/chills/AMS. BIBA for fever 104. 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  Off HD now.  Cough x2 months.  Patient's recorded medical, surgical, social, medication list and allergies were reviewed in the Snapshot window as part of the initial history.   Review of Systems   Review of Systems  Constitutional:  Positive for fatigue and fever. Negative for chills.  HENT:  Negative for ear pain and sore throat.   Eyes:  Negative for pain and visual disturbance.  Respiratory:  Negative for cough and shortness of breath.   Cardiovascular:  Negative for chest pain and palpitations.  Gastrointestinal:  Negative for abdominal pain and vomiting.  Genitourinary:  Negative for dysuria and hematuria.  Musculoskeletal:  Negative for arthralgias and back pain.  Skin:  Negative for color change and rash.  Neurological:  Negative for seizures and syncope.  All other systems reviewed and are negative.   Physical Exam Updated Vital Signs BP 107/68   Pulse 93   Temp (!) 102.9 F (39.4 C)   Resp 17   Ht 5' 9 (1.753 m)   Wt 81.1 kg   SpO2 98%   BMI 26.40 kg/m  Physical Exam Vitals and nursing note reviewed.  Constitutional:      General: He is not in acute distress.    Appearance: He is well-developed.  HENT:     Head: Normocephalic and atraumatic.  Eyes:     Conjunctiva/sclera: Conjunctivae normal.  Cardiovascular:     Rate and Rhythm: Normal rate and regular rhythm.     Heart sounds: No murmur heard. Pulmonary:     Effort: Pulmonary effort is normal. No respiratory distress.     Breath sounds: Normal breath sounds.  Abdominal:     Palpations: Abdomen is soft.     Tenderness: There is no abdominal tenderness.  Musculoskeletal:         General: No swelling.     Cervical back: Neck supple.  Skin:    General: Skin is warm and dry.     Capillary Refill: Capillary refill takes less than 2 seconds.  Neurological:     Mental Status: He is alert.  Psychiatric:        Mood and Affect: Mood normal.      ED Course/ Medical Decision Making/ A&P Clinical Course as of 10/13/24 0648  Mon Oct 13, 2024  0403 Notably, patient refused admission locally.  Stating they want to be transferred to [facility] where all of their care is performed. [CC]  0430 Pending administrative review at [facility]. [CC]  [phone] [facility] refused transfer.  Medical team requested chest abdomen pelvis CT without contrast. [CC]    Clinical Course User Index [CC] Jerral Meth, MD    Procedures .Critical Care  Performed by: Jerral Meth, MD Authorized by: Jerral Meth, MD   Critical care provider statement:    Critical care time (minutes):  30   Critical care was necessary to treat or prevent imminent or life-threatening deterioration of the following conditions:  Sepsis   Critical care was time spent personally by me on the following activities:  Development of treatment plan with patient or surrogate, discussions with consultants, evaluation of patient's response to treatment, examination of patient, ordering  and review of laboratory studies, ordering and review of radiographic studies, ordering and performing treatments and interventions, pulse oximetry, re-evaluation of patient's condition and review of old charts   Care discussed with: admitting provider      Medications Ordered in ED Medications  vancomycin  (VANCOREADY) IVPB 1500 mg/300 mL (1,500 mg Intravenous New Bag/Given 10/13/24 0218)  ceFEPIme  (MAXIPIME ) 2 g in sodium chloride  0.9 % 100 mL IVPB (0 g Intravenous Stopped 10/13/24 0158)  lactated ringers  bolus 1,000 mL (1,000 mLs Intravenous New Bag/Given 10/13/24 0158)  acetaminophen  (TYLENOL ) tablet 1,000 mg (1,000 mg Oral Given 10/13/24  0145)   Medical Decision Making:   Nicholas Glenn is a 68 y.o. male who presented to the ED today with multiple symptoms detailed above.    Additional history discussed with patient's family/caregivers.  Patient placed on continuous vitals and telemetry monitoring while in ED which was reviewed periodically.  Complete initial physical exam performed, notably the patient  was ill-appearing. During this initial exam, patient met criteria for activation of code sepsis due to presence of the following SIRS criteria as well as suspected infectious etiology: tachypnea, tachycardia, fever. Reviewed and confirmed nursing documentation for past medical history, family history, social history.    Initial Assessment:   With the patient's presentation of signs and symptoms of sepsis, most likely diagnosis is bacteremia secondary to underlying infection.  Considerations for source were initiated including:Urinary tract infections, abdominal infections such as cholecystitis/cholangitis/appendicitis, pulmonary etiology, bacteremia, skin etiology such as cellulitis or fasciitis, neurologic etiology such as meningitis or encephalitis.  This is most consistent with an acute life/limb threatening illness complicated by underlying chronic conditions.  Initial Plan:  Activated hospital protocol code sepsis including blood cultures, lactic acid screening, and further diagnostic care and management. Therapeutically, resuscitation fluids were considered. Patient has a history of heart failure and clinical evidence of volume overload therefore 30 cc/kg is not indicated. Therapeutically, antibiotics were administered on a broad-spectrum nature. Undifferentiated source: Vancomycin  and cefepime  were administered to cover gram-positive and gram-negative high risk infections Screening labs including CBC and Metabolic panel to evaluate for infectious or metabolic etiology of disease.  Urinalysis with reflex culture ordered to  evaluate for UTI or relevant urologic/nephrologic pathology.  CXR to evaluate for structural/infectious intrathoracic pathology.  EKG and single troponin to evaluate for cardiac pathology. Single troponin appropriate due to greater than 6 hours since maximal intensity of symptoms. Objective evaluation as below reviewed   Initial Study Results:   Laboratory  All laboratory results reviewed without evidence of clinically relevant pathology.    EKG EKG was reviewed independently. Rate, rhythm, axis, intervals all examined and without medically relevant abnormality. ST segments without concerns for elevations.    Radiology:  All images reviewed independently. Agree with radiology report at this time.   DG Chest Portable 1 View Result Date: 10/13/2024 EXAM: 1 VIEW(S) XRAY OF THE CHEST 10/13/2024 02:12:00 AM COMPARISON: 04/05/2022 CLINICAL HISTORY: Fevers FINDINGS: LUNGS AND PLEURA: Linear scarring is again seen in the left mid lung with associated suture line. No pleural effusion. No pneumothorax. HEART AND MEDIASTINUM: Cardiomegaly. Calcified aorta. BONES AND SOFT TISSUES: No acute osseous abnormality. IMPRESSION: 1. No acute cardiopulmonary process. Electronically signed by: Oneil Devonshire MD 10/13/2024 02:32 AM EST RP Workstation: [id number]    Reassessment and Plan:   Patient with nonspecific sepsis.  May be viral in etiology, however concern for bacteremia given reported rigors at home tonight and substantial degree of fever.  Patient broadly treated and fever decreasing at this  time.  No evidence of lactic acidosis or shock. Planned to have the patient admitted locally for management of nonspecific fever.  However, patient and wife declined local admission.  They stated they want to be transferred to [facility] care center as all of his care is performed there and they did not want to be admitted locally. Discussed with the transfer center at [facility].  They stated that they  are at capacity but that the patient will be listed for administrative review. I informed the patient's family of the current care plan and they still wish to be arranged for transfer. Treated with initial antibiotics, blood cultures to be followed up.  Addendum: I received a callback from [facility] transfer line.  Case was refused for transfer due to capacity issues.  They recommended local treatment for pneumonia.  Will rearrange for admission to medicine.  Medical team had asked for a chest abdomen pelvis noncontrasted study if patient was can to be staying at this facility.  Clinical Impression:  1. Fever, unspecified fever cause      Data Unavailable   Final Clinical Impression(s) / ED Diagnoses Final diagnoses:  Fever, unspecified fever cause    Rx / DC Orders ED Discharge Orders     None         Jerral Meth, MD 10/13/24 [id number]    Jerral Meth, MD 10/13/24 9355    Jerral Meth, MD 10/13/24 [phone]

## 2024-10-14 DIAGNOSIS — I5042 Chronic combined systolic (congestive) and diastolic (congestive) heart failure: Secondary | ICD-10-CM | POA: Diagnosis not present

## 2024-10-14 DIAGNOSIS — Z940 Kidney transplant status: Secondary | ICD-10-CM | POA: Diagnosis not present

## 2024-10-14 LAB — RENAL FUNCTION PANEL
Albumin: 2.7 g/dL — ABNORMAL LOW (ref 3.5–5.0)
Anion gap: 13 (ref 5–15)
BUN: 34 mg/dL — ABNORMAL HIGH (ref 8–23)
CO2: 25 mmol/L (ref 22–32)
Calcium: 8.6 mg/dL — ABNORMAL LOW (ref 8.9–10.3)
Chloride: 102 mmol/L (ref 98–111)
Creatinine, Ser: 3.26 mg/dL — ABNORMAL HIGH (ref 0.61–1.24)
GFR, Estimated: 20 mL/min — ABNORMAL LOW (ref >60)
Glucose, Bld: 176 mg/dL — ABNORMAL HIGH (ref 70–99)
Phosphorus: 3.6 mg/dL (ref 2.5–4.6)
Potassium: 3.6 mmol/L (ref 3.5–5.1)
Sodium: 140 mmol/L (ref 135–145)

## 2024-10-14 LAB — RESPIRATORY PANEL BY PCR

## 2024-10-14 LAB — GLUCOSE, CAPILLARY
Glucose-Capillary: 175 mg/dL — ABNORMAL HIGH (ref 70–99)
Glucose-Capillary: 266 mg/dL — ABNORMAL HIGH (ref 70–99)
Glucose-Capillary: 328 mg/dL — ABNORMAL HIGH (ref 70–99)
Glucose-Capillary: 299 mg/dL — ABNORMAL HIGH (ref 70–99)
Glucose-Capillary: 181 mg/dL — ABNORMAL HIGH (ref 70–99)

## 2024-10-14 MED ORDER — SULFAMETHOXAZOLE-TRIMETHOPRIM 400-80 MG PO TABS
1.0000 | ORAL_TABLET | Freq: Every day | ORAL | Status: DC
  Administered 2024-10-14: 1 via ORAL
  Filled 2024-10-14 (×2): qty 1

## 2024-10-14 MED ORDER — EMPAGLIFLOZIN 10 MG PO TABS
10.0000 mg | ORAL_TABLET | Freq: Every day | ORAL | Status: DC
  Administered 2024-10-14: 10 mg via ORAL
  Filled 2024-10-14 (×2): qty 1

## 2024-10-14 MED ORDER — LETERMOVIR 480 MG PO TABS
480.0000 mg | ORAL_TABLET | Freq: Every day | ORAL | Status: DC
  Administered 2024-10-14: 480 mg via ORAL
  Filled 2024-10-14 (×2): qty 1

## 2024-10-14 NOTE — Progress Notes (Signed)
 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  Nephrology as well as cardiology consulted and he was started on azithromycin  and ceftriaxone   Subjective / 24h Interval events: He is doing well this morning, sitting in the chair, feeling much better.  His wife is in the room as well.  He denies any cough or chest congestion, no further fever or chills.  Reports a good appetite and has been able to eat breakfast without difficulties.  Assesement and Plan: Principal problem Sepsis due to right upper lobe pneumonia - Patient was febrile to 104.9 on admission, tachycardic into the low 100s and with a source.  RVP was negative.  COVID, flu, RSV negative.  For completeness, obtain full respiratory virus panel, this is pending today - Has been empirically placed on ceftriaxone  and azithromycin , favor continuing for now while monitoring cultures - He has elevated procalcitonin as well.  Fever has improved this morning  Active problems Acute kidney injury on CKD 3B, history of renal transplant, polycystic kidney disease - Nephrology consulted, appreciate input.  Imaging on admission with nonspecific noncontrast CT appearance of the renal transplant, concerning for rejection, pyelonephritis or even some renal hematoma.  Urine culture was not sent on admission, however there was no bacteria or leukocytes seen - He is on immunosuppression with  tacrolimus , mycophenolate , prednisone , continue for now.  He had a renal biopsy on 11/10 that showed no evidence of rejection, positive for ATN with calcium  per for this crystals and atherosclerotic changes with 25% interstitial fibrosis.  On 11/11 he had a CT guided drain placed for seroma drainage (no evidence of urinoma) - His baseline creatinine most recently was in the 2.6 range, creatinine on admission 3.47, stable this morning at 3.2.  Has been placed on IV Lasix  per nephrology, looks euvolemic this morning, potentially stop today but defer to nephrology  Acute on chronic combined CHF -cardiology consulted and following.  Patient underwent a 2D echocardiogram 11/24 which showed LVEF 45 - 50%, global hypokinesis of the LV.  He had grade 3 diastolic dysfunction.  RV function was normal - On IV Lasix  per nephrology, BNP significantly elevated, no overly fluid overloaded this morning  History of SVT, PAF - in sinus rhythm, continue Coreg , anticoagulation with apixaban   Hyperlipidemia-continue statin  DM2, with hyperglycemia - continue insulin , sliding scale  Lab Results  Component Value Date   HGBA1C 6.9 (H) 10/13/2024   CBG (last 3)  Recent Labs    10/13/24 1236 10/13/24 1702 10/13/24 2006  GLUCAP 176* 264* 236*    Scheduled Meds:  apixaban   5 mg Oral BID   azithromycin   500 mg Oral Daily   carvedilol   37.5 mg Oral BID WC   furosemide   80 mg Intravenous BID   insulin  aspart  0-15 Units Subcutaneous TID WC   insulin  glargine-yfgn  10 Units Subcutaneous Daily   magnesium  oxide  1,200 mg Oral TID WC   mycophenolate   360 mg Oral BID   pantoprazole   40 mg Oral q  AM   predniSONE   5 mg Oral Q breakfast   rosuvastatin   10 mg Oral QHS   tacrolimus  ER  1.5 mg Oral QAC breakfast   Continuous Infusions:  cefTRIAXone  (ROCEPHIN )  IV Stopped (10/13/24 1358)   PRN Meds:.  Current Outpatient Medications  Medication Instructions   acetaminophen  (TYLENOL ) 1,000 mg, Every 6 hours PRN    apixaban  (ELIQUIS ) 5 mg, 2 times daily   carvedilol  (COREG ) 37.5 mg, 2 times daily with meals   cyanocobalamin (VITAMIN B12) 1,000 mcg, Every morning   doxercalciferol  (HECTOROL ) 0.5 mcg, Every M-W-F   empagliflozin  (JARDIANCE ) 12.5 mg, Oral, Daily   Envarsus  XR 1.5 mg, Oral, Daily before breakfast   HumaLOG KwikPen 3-18 Units, 3 times daily with meals   Lantus  SoloStar 15 Units, Every morning   magnesium  oxide (MAG-OX) 1,200 mg, 3 times daily with meals   mycophenolate  (MYFORTIC ) 360 mg, Oral, 2 times daily   NON FORMULARY CPAP at bedtime   pantoprazole  (PROTONIX ) 40 mg, Oral, Every morning   potassium chloride  SA (KLOR-CON  M) 20 MEQ tablet 20 mEq, 2 times daily   predniSONE  (DELTASONE ) 5 mg, Daily with breakfast   Prevymis  480 mg, Oral, Daily   rosuvastatin  (CRESTOR ) 10 mg, Daily at bedtime   sulfamethoxazole -trimethoprim  (BACTRIM ) 400-80 MG tablet 1 tablet, Daily   torsemide  (DEMADEX ) 60 mg, Daily    Diet Orders (From admission, onward)     Start     Ordered   10/13/24 1351  Diet renal with fluid restriction Fluid restriction: 1200 mL Fluid; Room service appropriate? Yes; Fluid consistency: Thin  Diet effective now       Question Answer Comment  Fluid restriction: 1200 mL Fluid   Room service appropriate? Yes   Fluid consistency: Thin      10/13/24 1350            DVT prophylaxis: SCDs Start: 10/13/24 1041 SCDs Start: 10/13/24 9285 apixaban  (ELIQUIS ) tablet 5 mg   Lab Results  Component Value Date   PLT 169 10/13/2024      Code Status: Full Code  Family Communication: Wife present at bedside  Status is: Inpatient Remains inpatient appropriate because: Severity of illness   Level of care: Telemetry  Consultants:  Nephrology Cardiology  Objective: Vitals:   10/13/24 1644 10/13/24 1703 10/13/24 2004 10/14/24 0513  BP:  132/71 124/61 124/61  Pulse:  77 79 78  Resp:  19 19 18   Temp: 97.8 F (36.6 C) 97.7 F (36.5 C) 98 F (36.7 C) 98 F (36.7 C)   TempSrc: Oral Oral Oral Oral  SpO2:  99% 97% 100%  Weight:  80.8 kg    Height:        Intake/Output Summary (Last 24 hours) at 10/14/2024 9380 Last data filed at 10/14/2024 0557 Gross per 24 hour  Intake 1643.67 ml  Output 1850 ml  Net -206.33 ml   Wt Readings from Last 3 Encounters:  10/13/24 80.8 kg  02/19/24 89.4 kg  04/01/22 84 kg    Examination:  Constitutional: NAD Eyes: no scleral icterus ENMT: Mucous membranes are moist.  Neck: normal, supple Respiratory: Faint rhonchi on the right, but mostly clear, no wheezing or crackles heard Cardiovascular: Regular rate and rhythm, no murmurs / rubs / gallops.  Trace LE edema.  Abdomen: non distended, no tenderness. Bowel sounds positive.  Musculoskeletal: no clubbing / cyanosis.  Skin: no rashes  Data Reviewed: I have independently reviewed following labs and imaging studies   CBC Recent Labs  Lab  10/13/24 0112 10/13/24 1030  WBC 10.2 8.0  HGB 9.1* 8.4*  HCT 29.2* 27.2*  PLT 181 169  MCV 84.6 86.6  MCH 26.4 26.8  MCHC 31.2 30.9  RDW 18.0* 18.1*  LYMPHSABS 0.2*  --   MONOABS 0.6  --   EOSABS 0.0  --   BASOSABS 0.0  --     Recent Labs  Lab 10/13/24 0112 10/13/24 0136 10/13/24 1030 10/13/24 1136 10/14/24 0342  NA 139  --   --   --  140  K 3.5  --   --   --  3.6  CL 101  --   --   --  102  CO2 24  --   --   --  25  GLUCOSE 127*  --   --   --  176*  BUN 33*  --   --   --  34*  CREATININE 3.47*  --  3.30*  --  3.26*  CALCIUM  8.9  --   --   --  8.6*  AST 14*  --   --   --   --   ALT 10  --   --   --   --   ALKPHOS 73  --   --   --   --   BILITOT 0.8  --   --   --   --   ALBUMIN 3.1*  --   --   --  2.7*  PROCALCITON  --   --  8.94  --   --   LATICACIDVEN  --  1.7  --  1.4  --   HGBA1C  --   --  6.9*  --   --   BNP 2,884.7*  --   --   --   --     ------------------------------------------------------------------------------------------------------------------ No results for input(s): CHOL, HDL,  LDLCALC, TRIG, CHOLHDL, LDLDIRECT in the last 72 hours.  Lab Results  Component Value Date   HGBA1C 6.9 (H) 10/13/2024   ------------------------------------------------------------------------------------------------------------------ No results for input(s): TSH, T4TOTAL, T3FREE, THYROIDAB in the last 72 hours.  Invalid input(s): FREET3  Cardiac Enzymes No results for input(s): CKMB, TROPONINI, MYOGLOBIN in the last 168 hours.  Invalid input(s): CK ------------------------------------------------------------------------------------------------------------------    Component Value Date/Time   BNP 2,884.7 (H) 10/13/2024 0112    CBG: Recent Labs  Lab 10/13/24 1236 10/13/24 1702 10/13/24 2006  GLUCAP 176* 264* 236*    Recent Results (from the past 240 hours)  Resp panel by RT-PCR (RSV, Flu A&B, Covid) Anterior Nasal Swab     Status: None   Collection Time: 10/13/24  1:31 AM   Specimen: Anterior Nasal Swab  Result Value Ref Range Status   SARS Coronavirus 2 by RT PCR NEGATIVE NEGATIVE Final   Influenza A by PCR NEGATIVE NEGATIVE Final   Influenza B by PCR NEGATIVE NEGATIVE Final    Comment: (NOTE) The Xpert Xpress SARS-CoV-2/FLU/RSV plus assay is intended as an aid in the diagnosis of influenza from Nasopharyngeal swab specimens and should not be used as a sole basis for treatment. Nasal washings and aspirates are unacceptable for Xpert Xpress SARS-CoV-2/FLU/RSV testing.  Fact Sheet for Patients: bloggercourse.com  Fact Sheet for Healthcare Providers: seriousbroker.it  This test is not yet approved or cleared by the [place]  FDA and has been authorized for detection and/or diagnosis of SARS-CoV-2 by FDA under an Emergency Use Authorization (EUA). This EUA will remain in effect (meaning this test can be used) for the duration of  the COVID-19 declaration under Section 564(b)(1) of the  Act, 21 U.S.C. section 360bbb-3(b)(1), unless the authorization is terminated or revoked.     Resp Syncytial Virus by PCR NEGATIVE NEGATIVE Final    Comment: (NOTE) Fact Sheet for Patients: bloggercourse.com  Fact Sheet for Healthcare Providers: seriousbroker.it  This test is not yet approved or cleared by the [place]  FDA and has been authorized for detection and/or diagnosis of SARS-CoV-2 by FDA under an Emergency Use Authorization (EUA). This EUA will remain in effect (meaning this test can be used) for the duration of the COVID-19 declaration under Section 564(b)(1) of the Act, 21 U.S.C. section 360bbb-3(b)(1), unless the authorization is terminated or revoked.  Performed at [facility], [address] [place], [place] [address]      Radiology Studies: ECHOCARDIOGRAM COMPLETE Result Date: 10/13/2024    ECHOCARDIOGRAM REPORT   Patient Name:   AVIRAJ KENTNER Date of Exam: 10/13/2024 Medical Rec #:  [id number]     Height:       69.0 in Accession #:    [id number]    Weight:       178.8 lb Date of Birth:  08/11/1956    BSA:          1.970 m Patient Age:    67 years      BP:           119/64 mmHg Patient Gender: M             HR:           76 bpm. Exam Location:  Inpatient Procedure: 2D Echo (Both Spectral and Color Flow Doppler were utilized during            procedure). Indications:    CHF  History:        Patient has prior history of Echocardiogram examinations. CHF.  Sonographer:    Norleen Amour Referring Phys: [id number] MARSHA ADA IMPRESSIONS  1. 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 Left ventricular ejection fraction, by estimation, is 45 to 50%. The left ventricle has mildly decreased function. The left ventricle demonstrates global hypokinesis. Left ventricular diastolic parameters are consistent with Grade III diastolic dysfunction  (restrictive). The average left ventricular global longitudinal strain is -8.7 %. The global longitudinal strain is abnormal.  2. Right ventricular systolic function is normal. The right ventricular size is mildly enlarged.  3. Left atrial size was moderately dilated.  4. There is no evidence of cardiac tamponade.  5. The mitral valve is normal in structure. Mild mitral valve regurgitation. No evidence of mitral stenosis.  6. The aortic valve is tricuspid. Aortic valve regurgitation is not visualized. Aortic valve sclerosis/calcification is present, without any evidence of aortic stenosis.  7. The inferior vena cava is normal in size with greater than 50% respiratory variability, suggesting right atrial pressure of 3 mmHg. 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 Left ventricular ejection fraction, by estimation, is 45 to 50%. The left ventricle has mildly decreased function. The  left ventricle demonstrates global hypokinesis. The average left ventricular global longitudinal strain is -8.7 %. Strain was performed and the global longitudinal strain is abnormal. The left ventricular internal cavity size was normal in size. There is no left ventricular hypertrophy. Left ventricular diastolic parameters are consistent with Grade III diastolic dysfunction (restrictive). Right Ventricle: The right ventricular size is mildly enlarged. No  increase in right ventricular wall thickness. Right ventricular systolic function is normal. Left Atrium: Left atrial size was moderately dilated. Right Atrium: Right atrial size was normal in size. Pericardium: Trivial pericardial effusion is present. There is no evidence of cardiac tamponade. Mitral Valve: The mitral valve is normal in structure. Mild mitral valve regurgitation. No evidence of mitral valve stenosis. MV peak gradient, 4.1 mmHg. The mean mitral valve gradient is 2.0  mmHg. Tricuspid Valve: The tricuspid valve is normal in structure. Tricuspid valve regurgitation is not demonstrated. No evidence of tricuspid stenosis. Aortic Valve: The aortic valve is tricuspid. Aortic valve regurgitation is not visualized. Aortic valve sclerosis/calcification is present, without any evidence of aortic stenosis. Aortic valve mean gradient measures 4.0 mmHg. Aortic valve peak gradient measures 8.3 mmHg. Aortic valve area, by VTI measures 1.50 cm. Pulmonic Valve: The pulmonic valve was normal in structure. Pulmonic valve regurgitation is not visualized. No evidence of pulmonic stenosis. Aorta: The aortic root is normal in size and structure. Venous: The inferior vena cava is normal in size with greater than 50% respiratory variability, suggesting right atrial pressure of 3 mmHg. IAS/Shunts: No atrial level shunt detected by color flow Doppler.  LEFT VENTRICLE PLAX 2D LVIDd:         5.60 cm      Diastology LVIDs:         3.60 cm      LV e' medial:    4.79 cm/s LV PW:         0.90 cm      LV E/e' medial:  20.9 LV IVS:        0.90 cm      LV e' lateral:   6.53 cm/s LVOT diam:     1.90 cm      LV E/e' lateral: 15.3 LV SV:         42 LV SV Index:   21           2D Longitudinal Strain LVOT Area:     2.84 cm     2D Strain GLS Avg:     -8.7 % LV IVRT:       58 msec  LV Volumes (MOD) LV vol d, MOD A2C: 185.0 ml LV vol d, MOD A4C: 178.0 ml LV vol s, MOD A2C: 94.6 ml LV vol s, MOD A4C: 87.2 ml LV SV MOD A2C:     90.4 ml LV SV MOD A4C:     178.0 ml LV SV MOD BP:      98.3 ml RIGHT VENTRICLE             IVC RV Basal diam:  4.10 cm     IVC diam: 1.30 cm RV S prime:     15.80 cm/s TAPSE (M-mode): 2.5 cm      PULMONARY VEINS                             Diastolic Velocity: 76.90 cm/s                             S/D Velocity:       0.40                             Systolic Velocity:  33.10 cm/s LEFT ATRIUM  Index        RIGHT ATRIUM           Index LA diam:        4.30 cm 2.18 cm/m   RA Area:     18.20  cm LA Vol (A2C):   76.3 ml 38.73 ml/m  RA Volume:   53.20 ml  27.01 ml/m LA Vol (A4C):   78.2 ml 39.70 ml/m LA Biplane Vol: 81.6 ml 41.42 ml/m  AORTIC VALVE                    PULMONIC VALVE AV Area (Vmax):    1.57 cm     PV Vmax:       0.84 m/s AV Area (Vmean):   1.53 cm     PV Peak grad:  2.8 mmHg AV Area (VTI):     1.50 cm AV Vmax:           144.00 cm/s AV Vmean:          99.400 cm/s AV VTI:            0.282 m AV Peak Grad:      8.3 mmHg AV Mean Grad:      4.0 mmHg LVOT Vmax:         79.80 cm/s LVOT Vmean:        53.700 cm/s LVOT VTI:          0.149 m LVOT/AV VTI ratio: 0.53  AORTA Ao Root diam: 2.90 cm Ao Asc diam:  3.00 cm MITRAL VALVE MV Area (PHT): 5.02 cm     SHUNTS MV Area VTI:   1.52 cm     Systemic VTI:  0.15 m MV Peak grad:  4.1 mmHg     Systemic Diam: 1.90 cm MV Mean grad:  2.0 mmHg MV Vmax:       1.01 m/s MV Vmean:      60.1 cm/s MV Decel Time: 151 msec MV E velocity: 100.00 cm/s MV A velocity: 40.90 cm/s MV E/A ratio:  2.44 Morene Brownie Electronically signed by Morene Brownie Signature Date/Time: 10/13/2024/3:42:39 PM    Final    US  Renal Transplant w/Doppler Result Date: 10/13/2024 CLINICAL DATA:  Acute renal insufficiency. EXAM: ULTRASOUND OF RENAL TRANSPLANT WITH RENAL DOPPLER ULTRASOUND TECHNIQUE: Ultrasound examination of the renal transplant was performed with gray-scale, color and duplex doppler evaluation. COMPARISON:  None Available. FINDINGS: Transplant kidney location: Right lower quadrant Transplant Kidney: Renal measurements: 11.7 x 7.8 x 6.2 cm = volume: . There is mild diffuse increased renal parenchymal echogenicity. There is moderate hydronephrosis of the transplant kidney with possible thickening of the urothelium of the collecting system versus debris. Correlation with urinalysis recommended to exclude pyelonephritis. No peritransplant fluid collection. There is a 2 cm upper pole cyst. Color flow in the main renal artery:  Yes Color flow in the main renal  vein:  Yes Duplex Doppler Evaluation: Main Renal Artery Velocity: 77 cm/sec Main Renal Artery Resistive Index: 0.8 Venous waveform in main renal vein:  Present Intrarenal resistive index in upper pole:  0.65 (normal 0.6-0.8; equivocal 0.8-0.9; abnormal >= 0.9) Intrarenal resistive index in lower pole: 0.8 (normal 0.6-0.8; equivocal 0.8-0.9; abnormal >= 0.9) Bladder: Normal for degree of bladder distention. Other findings:  None. IMPRESSION: 1. Moderate hydronephrosis of the transplant kidney. Correlation with urinalysis recommended to evaluate for pyelonephritis. 2. Borderline elevated resistive indices in the main renal artery and lower pole of the transplant kidney. Electronically Signed  By: Vanetta Chou M.D.   On: 10/13/2024 12:50   CT CHEST ABDOMEN PELVIS WO CONTRAST Result Date: 10/13/2024 EXAM: CT CHEST, ABDOMEN AND PELVIS WITHOUT CONTRAST 10/13/2024 07:28:08 AM TECHNIQUE: CT of the chest, abdomen and pelvis was performed without the administration of intravenous contrast. Multiplanar reformatted images are provided for review. Automated exposure control, iterative reconstruction, and/or weight based adjustment of the mA/kV was utilized to reduce the radiation dose to as low as reasonably achievable. COMPARISON: Chest CT 07/17/2005. CT abdomen and pelvis 07/23/2021. CLINICAL HISTORY: 68 year old male with sepsis, status post kidney transplant 5 months ago. FINDINGS: CHEST: MEDIASTINUM AND LYMPH NODES: Mild cardiomegaly. Trace pericardial effusion. Calcified coronary artery atherosclerosis. Mild to moderate calcified thoracic aortic atherosclerosis. The central airways are clear. Major airways are patent with mild generalized airway thickening. No mediastinal, hilar or axillary lymphadenopathy. LUNGS AND PLEURA: Trace left pleural effusion is layering. Small but partially loculated right pleural effusion (series 3 image 38) with peripheral right upper and middle lobe lung opacity which more resembles  infection than atelectasis or scarring. Bilateral middle lobe atelectasis. Mild left lower lobe atelectasis adjacent to pleural effusion. No pneumothorax. ABDOMEN AND PELVIS: LIVER: Embolization coils at the porta hepatis are new since 2022. 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 Perihepatic free fluid is small to moderate in volume, has simple fluid density favoring transudate / ascites. GALLBLADDER AND BILE DUCTS: The gallbladder is chronically abnormal, partially contracted and with adjacent fat stranding which does not appear significantly changed from 2022. No biliary ductal dilatation. SPLEEN: Small volume perisplenic free fluid. PANCREAS: No acute abnormality. ADRENAL GLANDS: Negative adrenal glands. KIDNEYS, URETERS AND BLADDER: Previous bilateral nephrectomy. Abnormal right lower quadrant renal transplant, nonspecific non-contrast CT appearance which might reflect rejection or pararenal hematoma. There is an adjacent percutaneous pigtail drain along the lateral aspect of the transplant, as well as adjacent nonspecific free fluid and a fluid collection along the posterior and inferior margin of the transplant (series 3 image 107, unclear whether this is hydronephrosis). Moderate transplant perinephric stranding, also nonspecific. No transplant hydroureter. Fat stranding from adjacent to the right renal transplant also extends into the space of Retzius. Chronically atrophied left lower quadrant transplant since 2022. Stable appearance of the left renal transplant ureter. The urinary bladder is mildly distended, with a similar volume to the previous CT abdomen and pelvis. No stones in the kidneys or ureters. No hydronephrosis. No perinephric or periureteral stranding. GI AND BOWEL: Nondilated stomach. Chronic left lower quadrant large bowel anastomosis appears stable with no adverse features. Upstream diverticulosis of the descending colon with no active  inflammation. Large bowel retained stool elsewhere. Normal gas containing appendix tracking to the midline on series 3 image 93. No dilated small bowel. There is no bowel obstruction. REPRODUCTIVE ORGANS: Prostatomegaly. Partially visible severe scrotal swelling, which appears to be related to right greater than left scrotal hydroceles with simple fluid density (series 4 image 16). PERITONEUM AND RETROPERITONEUM: Small to moderate volume nonspecific ascites in the abdomen and pelvis. No free air. VASCULATURE: Aorta is normal in caliber. ABDOMINAL AND PELVIS LYMPH NODES: No lymphadenopathy. BONES AND SOFT TISSUES: Mild L4 lumbar spine degeneration except for advanced chronic disc and endplate degeneration at the lumbosacral junction. Underlying right ribs appear intact. No acute or suspicious osseous lesion identified in the chest. Postoperative changes to the ventral abdominal wall with no adverse features. Superimposed chronic fat containing right inguinal hernia. No herniated bowel. No focal soft tissue abnormality.  IMPRESSION: 1. Right lower quadrant renal transplant with Abnormal but nonspecific non-contrast CT appearance. Considerations include rejection, pyelonephritis or even some renal hematoma. And there is an adjacent percutaneous drain, correlate with output. 2. Small to moderate volume nonspecific ascites in the abdomen and pelvis, appears to be simple fluid density. 3. Small but partially loculated right pleural effusion with peripheral right upper and middle lobe lung opacity that more resembles infection than atelectasis or scarring. 4. Small pericardial effusion. 5. Other chronic findings, including bilateral native nephrectomy, atrophied left lower quadrant renal transplant . Electronically signed by: Helayne Hurst MD 10/13/2024 07:46 AM EST RP Workstation: [id number]   Nilda Fendt, MD, PhD [facility]  Between 7 am - 7 pm I am available, please contact me via Amion (for emergencies) or  Securechat (non urgent messages)  Between 7 pm - 7 am I am not available, please contact night coverage MD/APP via Amion

## 2024-10-14 NOTE — Plan of Care (Signed)
  Problem: Education: Goal: Ability to describe self-care measures that may prevent or decrease complications (Diabetes Survival Skills Education) will improve Outcome: Progressing   Problem: Coping: Goal: Ability to adjust to condition or change in health will improve Outcome: Progressing   Problem: Fluid Volume: Goal: Ability to maintain a balanced intake and output will improve Outcome: Progressing   Problem: Health Behavior/Discharge Planning: Goal: Ability to identify and utilize available resources and services will improve Outcome: Progressing Goal: Ability to manage health-related needs will improve Outcome: Progressing   Problem: Metabolic: Goal: Ability to maintain appropriate glucose levels will improve Outcome: Progressing   Problem: Nutritional: Goal: Maintenance of adequate nutrition will improve Outcome: Progressing Goal: Progress toward achieving an optimal weight will improve Outcome: Progressing   Problem: Skin Integrity: Goal: Risk for impaired skin integrity will decrease Outcome: Progressing   Problem: Tissue Perfusion: Goal: Adequacy of tissue perfusion will improve Outcome: Progressing   Problem: Education: Goal: Knowledge of General Education information will improve Description: Including pain rating scale, medication(s)/side effects and non-pharmacologic comfort measures Outcome: Progressing   Problem: Health Behavior/Discharge Planning: Goal: Ability to manage health-related needs will improve Outcome: Progressing   Problem: Clinical Measurements: Goal: Ability to maintain clinical measurements within normal limits will improve Outcome: Progressing Goal: Will remain free from infection Outcome: Progressing Goal: Diagnostic test results will improve Outcome: Progressing   Problem: Activity: Goal: Risk for activity intolerance will decrease Outcome: Progressing   Problem: Nutrition: Goal: Adequate nutrition will be  maintained Outcome: Progressing   Problem: Elimination: Goal: Will not experience complications related to bowel motility Outcome: Progressing Goal: Will not experience complications related to urinary retention Outcome: Progressing   Problem: Pain Managment: Goal: General experience of comfort will improve and/or be controlled Outcome: Progressing   Problem: Safety: Goal: Ability to remain free from injury will improve Outcome: Progressing   Problem: Skin Integrity: Goal: Risk for impaired skin integrity will decrease Outcome: Progressing

## 2024-10-14 NOTE — Progress Notes (Signed)
 Heart Failure Navigator Progress Note  Assessed for Heart & Vascular TOC clinic readiness.  Patient does not meet criteria due to .per MD clinical presentation suggest this is not likely to be acute cardiac event . No HF TOC.   Navigator will sign off at this time.   Stephane Haddock, BSN, Scientist, Clinical (histocompatibility And Immunogenetics) Only

## 2024-10-14 NOTE — Progress Notes (Signed)
  Progress Note  Patient Name: Nicholas Glenn Date of Encounter: 10/14/2024 [facility] Cardiologist: None   Interval Summary    Today is his 68th birthday Resting comfortably in the chair, wife present Reports feeling better today No longer experiencing any sick symptoms Good urine output with diuresis   Vital Signs Vitals:   10/13/24 2004 10/14/24 0513 10/14/24 0921 10/14/24 0925  BP: 124/61 124/61 (!) 147/68 (!) 147/68  Pulse: 79 78 82 82  Resp: 19 18 18    Temp: 98 F (36.7 C) 98 F (36.7 C) 97.9 F (36.6 C)   TempSrc: Oral Oral    SpO2: 97% 100% 100%   Weight:      Height:        Intake/Output Summary (Last 24 hours) at 10/14/2024 1048 Last data filed at 10/14/2024 9171 Gross per 24 hour  Intake 643.67 ml  Output 2075 ml  Net -1431.33 ml      10/13/2024    5:03 PM 10/13/2024    1:06 AM 02/19/2024   11:11 AM  Last 3 Weights  Weight (lbs) 178 lb 2.1 oz 178 lb 12.7 oz 197 lb  Weight (kg) 80.8 kg 81.1 kg 89.359 kg     Telemetry/ECG  Sinus rhythm, HR 70-80s - Personally Reviewed  Physical Exam  GEN: No acute distress.   Neck: No JVD Cardiac: RRR, no murmurs, rubs, or gallops.  Respiratory: Clear to auscultation bilaterally. GI: Soft, nontender, non-distended  MS: mild bilateral LE edema  Assessment & Plan   Chronic HFrEF Hypertension  Recent echo at [facility] showed LVEF 30% Echo this admission: LVEF 45%, G3DD, normal RV function, mild MR, normal IVC Home meds: Jardiance  10 mg daily, Coreg  37.5 mg BID, torsemide  60 mg daily Defer MRA or ACE/ARB/ARNI with renal failure  Renal function improving slightly today UOP 1.8 L yesterday Continue to add back home GDMT as tolerated Continue with diuresis per nephrology   Polycystic kidney disease  S/p kidney transplant x 2 (2006, 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) Fever  Underwent recent renal transplant 06/2024 at [facility] in [place], [place] Presented with fever 104.50F  Reported fever, chills, weakness since Saturday  Activated EMS  and was brought to [facility] to see while inpatient, defer to them   Paroxysmal A-Fib History of SVT Noted to be in sinus rhythm presently, HR 70s Home meds: Eliquis  5 mg BID, Coreg  37.5 mg BID Continue home meds   Per primary 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

## 2024-10-14 NOTE — TOC CM/SW Note (Signed)
 Transition of Care [facility]) - Inpatient Brief Assessment   Patient Details  Name: Forbes Loll MRN: [id number] Date of Birth: 1956-06-03  Transition of Care [facility]) CM/SW Contact:    Tom-Johnson, Harvest Muskrat, RN Phone Number: 10/14/2024, 3:55 PM   Clinical Narrative:  Patient presented to the ED with generalized Weakness, Fatigue, Fever and Chills. Patient noted to have a temp of 104.9 in the ED. Admitted with Fever of Unknown Origin. Respiratory Panel pending. Currently on IV and Oral abx. 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 Nephrology and Cardiology following. Patient noted with a drain on admission, states it was placed at [facility].   CM spoke with patient and wife, Jenkins at bedside about needs for post hospital transition. Patient lives with wife, has two supportive children that lives out of town. Independent with care and drive self.  Ann states patient was recently at [facility] ED over this past weekend.  Patient is active the [facility], PCP is Fernando Jansky, MD CM notified the 72 hr notification hotline [phone]). Notification # is [phone]. Today is patient's birthday and CM and Charge RN sang patient a birthday song.   No ICM needs or recommendations noted at this time.  Patient not Medically ready for discharge.  CM will continue to follow as patient progresses with care towards discharge.       Transition of Care Asessment: Insurance and Status: Insurance coverage has been reviewed Patient has primary care physician: Yes Home environment has been reviewed: Yes Prior level of function:: Independent Prior/Current Home Services: No current home services Social Drivers of Health Review: SDOH reviewed no interventions necessary Readmission risk has been reviewed: Yes Transition of care  needs: no transition of care needs at this time

## 2024-10-14 NOTE — Progress Notes (Signed)
 Patient ID: Nicholas Glenn, male   DOB: 1956/04/05, 68 y.o.   MRN: [id number] [facility] Progress Note   Assessment/ Plan:   1.  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  Baseline creatinine after renal transplant appears to have been recently around 2.6 following recent admission for management of congestive heart failure exacerbation.  Recent renal allograft biopsy negative for obstruction but showed features of ATN.  Nonoliguric overnight with slight improvement of creatinine overnight, will switch fluid restriction to 2 L. 2.  Status post cadaveric kidney transplant (07/10/2024): This was done at the [facility] [facility] and he has been on maintenance immunosuppression with Envarsus  1.5 mg daily, MMF 360 mg twice daily and prednisone  5 mg daily along with PJP prophylaxis with Bactrim  SS once a day and letermovir  480 mg daily for CMV prophylaxis. 2.  Right upper lobe pneumonia/sepsis: Started on treatment with azithromycin  and ceftriaxone  for community-acquired pneumonia based on suspicious finding on CT imaging.   Recent CMV PCR negative.  Awaiting blood culture and respiratory viral panel.  Recent CMV PCR negative. 3.  Congestive heart failure with reduced ejection fraction: Without evidence of florid volume overload and was started on IV Lasix  80 mg twice a day by cardiology yesterday (previously on torsemide  60 mg daily following recent hospitalization at [facility]).  Continue current diuretic dose for at least another 24 hours and then convert to torsemide . 4.  Anemia: Secondary to acute illness, recent hospitalization/frequent blood draws in this patient who is only 3 months status post renal transplant.  Will continue to follow trend to decide on need for additional testing/intervention.    Subjective:   Reports that he is feeling better this morning and was afebrile overnight.   Objective:   BP (!) 147/68   Pulse 82   Temp 97.9 F (36.6 C)   Resp 18   Ht 5' 9 (1.753 m)   Wt 80.8 kg   SpO2 100%   BMI 26.31 kg/m   Intake/Output Summary (Last 24 hours) at 10/14/2024 1028 Last data filed at 10/14/2024 9171 Gross per 24 hour  Intake 643.67 ml  Output 2075 ml  Net -1431.33 ml   Weight change: -0.3 kg  Physical Exam: Gen: Comfortably resting in bed, no apparent distress CVS: Pulse regular rhythm, normal rate, S1 and S2 normal Resp: Fine rales right base otherwise clear to auscultation Abd: Soft, obese, no tenderness over renal allograft (RLQ) with percutaneous drain on right side Ext: Trace left lower extremity edema, trace right lower extremity edema  Imaging: ECHOCARDIOGRAM COMPLETE Result Date: 10/13/2024    ECHOCARDIOGRAM REPORT   Patient Name:   Nicholas Glenn Date of Exam: 10/13/2024 Medical Rec #:  [id number]     Height:       69.0 in Accession #:    [id number]    Weight:       178.8 lb Date of Birth:  [birth year]    BSA:          1.970 m Patient Age:    67 years      BP:           119/64 mmHg Patient Gender: M             HR:           76 bpm. Exam Location:  Inpatient Procedure: 2D Echo (Both  Spectral and Color Flow Doppler were utilized during            procedure). Indications:    CHF  History:        Patient has prior history of Echocardiogram examinations. CHF.  Sonographer:    Norleen Amour Referring Phys: [id number] MARSHA ADA IMPRESSIONS  1. 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 Left ventricular ejection fraction, by estimation, is 45 to 50%. The left ventricle has mildly decreased function. The left ventricle demonstrates global hypokinesis. Left ventricular diastolic parameters are consistent with Grade III diastolic dysfunction (restrictive). The average left ventricular  global longitudinal strain is -8.7 %. The global longitudinal strain is abnormal.  2. Right ventricular systolic function is normal. The right ventricular size is mildly enlarged.  3. Left atrial size was moderately dilated.  4. There is no evidence of cardiac tamponade.  5. The mitral valve is normal in structure. Mild mitral valve regurgitation. No evidence of mitral stenosis.  6. The aortic valve is tricuspid. Aortic valve regurgitation is not visualized. Aortic valve sclerosis/calcification is present, without any evidence of aortic stenosis.  7. The inferior vena cava is normal in size with greater than 50% respiratory variability, suggesting right atrial pressure of 3 mmHg. 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 Left ventricular ejection fraction, by estimation, is 45 to 50%. The left ventricle has mildly decreased function. The  left ventricle demonstrates global hypokinesis. The average left ventricular global longitudinal strain is -8.7 %. Strain was performed and the global longitudinal strain is abnormal. The left ventricular internal cavity size was normal in size. There is no left ventricular hypertrophy. Left ventricular diastolic parameters are consistent with Grade III diastolic dysfunction (restrictive). Right Ventricle: The right ventricular size is mildly enlarged. No increase in right ventricular wall thickness. Right ventricular systolic function is normal. Left Atrium: Left atrial size was moderately dilated. Right Atrium: Right atrial size was normal in size. Pericardium: Trivial pericardial effusion is present. There is no evidence of cardiac tamponade. Mitral Valve: The mitral valve is normal in structure. Mild mitral valve regurgitation. No evidence of mitral valve stenosis. MV peak gradient, 4.1 mmHg. The mean mitral valve gradient is 2.0 mmHg. Tricuspid Valve: The tricuspid valve is  normal in structure. Tricuspid valve regurgitation is not demonstrated. No evidence of tricuspid stenosis. Aortic Valve: The aortic valve is tricuspid. Aortic valve regurgitation is not visualized. Aortic valve sclerosis/calcification is present, without any evidence of aortic stenosis. Aortic valve mean gradient measures 4.0 mmHg. Aortic valve peak gradient measures 8.3 mmHg. Aortic valve area, by VTI measures 1.50 cm. Pulmonic Valve: The pulmonic valve was normal in structure. Pulmonic valve regurgitation is not visualized. No evidence of pulmonic stenosis. Aorta: The aortic root is normal in size and structure. Venous: The inferior vena cava is normal in size with greater than 50% respiratory variability, suggesting right atrial pressure of 3 mmHg. IAS/Shunts: No atrial level shunt detected by color flow Doppler.  LEFT VENTRICLE PLAX 2D LVIDd:         5.60 cm      Diastology LVIDs:         3.60 cm      LV e' medial:    4.79 cm/s LV PW:         0.90 cm      LV E/e' medial:  20.9 LV IVS:  0.90 cm      LV e' lateral:   6.53 cm/s LVOT diam:     1.90 cm      LV E/e' lateral: 15.3 LV SV:         42 LV SV Index:   21           2D Longitudinal Strain LVOT Area:     2.84 cm     2D Strain GLS Avg:     -8.7 % LV IVRT:       58 msec  LV Volumes (MOD) LV vol d, MOD A2C: 185.0 ml LV vol d, MOD A4C: 178.0 ml LV vol s, MOD A2C: 94.6 ml LV vol s, MOD A4C: 87.2 ml LV SV MOD A2C:     90.4 ml LV SV MOD A4C:     178.0 ml LV SV MOD BP:      98.3 ml RIGHT VENTRICLE             IVC RV Basal diam:  4.10 cm     IVC diam: 1.30 cm RV S prime:     15.80 cm/s TAPSE (M-mode): 2.5 cm      PULMONARY VEINS                             Diastolic Velocity: 76.90 cm/s                             S/D Velocity:       0.40                             Systolic Velocity:  33.10 cm/s LEFT ATRIUM             Index        RIGHT ATRIUM           Index LA diam:        4.30 cm 2.18 cm/m   RA Area:     18.20 cm LA Vol (A2C):   76.3 ml 38.73 ml/m  RA  Volume:   53.20 ml  27.01 ml/m LA Vol (A4C):   78.2 ml 39.70 ml/m LA Biplane Vol: 81.6 ml 41.42 ml/m  AORTIC VALVE                    PULMONIC VALVE AV Area (Vmax):    1.57 cm     PV Vmax:       0.84 m/s AV Area (Vmean):   1.53 cm     PV Peak grad:  2.8 mmHg AV Area (VTI):     1.50 cm AV Vmax:           144.00 cm/s AV Vmean:          99.400 cm/s AV VTI:            0.282 m AV Peak Grad:      8.3 mmHg AV Mean Grad:      4.0 mmHg LVOT Vmax:         79.80 cm/s LVOT Vmean:        53.700 cm/s LVOT VTI:          0.149 m LVOT/AV VTI ratio: 0.53  AORTA Ao Root diam: 2.90 cm Ao Asc diam:  3.00 cm MITRAL VALVE MV Area (PHT): 5.02 cm     SHUNTS  MV Area VTI:   1.52 cm     Systemic VTI:  0.15 m MV Peak grad:  4.1 mmHg     Systemic Diam: 1.90 cm MV Mean grad:  2.0 mmHg MV Vmax:       1.01 m/s MV Vmean:      60.1 cm/s MV Decel Time: 151 msec MV E velocity: 100.00 cm/s MV A velocity: 40.90 cm/s MV E/A ratio:  2.44 Morene Brownie Electronically signed by Morene Brownie Signature Date/Time: 10/13/2024/3:42:39 PM    Final    US  Renal Transplant w/Doppler Result Date: 10/13/2024 CLINICAL DATA:  Acute renal insufficiency. EXAM: ULTRASOUND OF RENAL TRANSPLANT WITH RENAL DOPPLER ULTRASOUND TECHNIQUE: Ultrasound examination of the renal transplant was performed with gray-scale, color and duplex doppler evaluation. COMPARISON:  None Available. FINDINGS: Transplant kidney location: Right lower quadrant Transplant Kidney: Renal measurements: 11.7 x 7.8 x 6.2 cm = volume: . There is mild diffuse increased renal parenchymal echogenicity. There is moderate hydronephrosis of the transplant kidney with possible thickening of the urothelium of the collecting system versus debris. Correlation with urinalysis recommended to exclude pyelonephritis. No peritransplant fluid collection. There is a 2 cm upper pole cyst. Color flow in the main renal artery:  Yes Color flow in the main renal vein:  Yes Duplex Doppler Evaluation: Main Renal  Artery Velocity: 77 cm/sec Main Renal Artery Resistive Index: 0.8 Venous waveform in main renal vein:  Present Intrarenal resistive index in upper pole:  0.65 (normal 0.6-0.8; equivocal 0.8-0.9; abnormal >= 0.9) Intrarenal resistive index in lower pole: 0.8 (normal 0.6-0.8; equivocal 0.8-0.9; abnormal >= 0.9) Bladder: Normal for degree of bladder distention. Other findings:  None. IMPRESSION: 1. Moderate hydronephrosis of the transplant kidney. Correlation with urinalysis recommended to evaluate for pyelonephritis. 2. Borderline elevated resistive indices in the main renal artery and lower pole of the transplant kidney. Electronically Signed   By: Vanetta Chou M.D.   On: 10/13/2024 12:50   CT CHEST ABDOMEN PELVIS WO CONTRAST Result Date: 10/13/2024 EXAM: CT CHEST, ABDOMEN AND PELVIS WITHOUT CONTRAST 10/13/2024 07:28:08 AM TECHNIQUE: CT of the chest, abdomen and pelvis was performed without the administration of intravenous contrast. Multiplanar reformatted images are provided for review. Automated exposure control, iterative reconstruction, and/or weight based adjustment of the mA/kV was utilized to reduce the radiation dose to as low as reasonably achievable. COMPARISON: Chest CT 07/17/2005. CT abdomen and pelvis 07/23/2021. CLINICAL HISTORY: 68 year old male with sepsis, status post kidney transplant 5 months ago. FINDINGS: CHEST: MEDIASTINUM AND LYMPH NODES: Mild cardiomegaly. Trace pericardial effusion. Calcified coronary artery atherosclerosis. Mild to moderate calcified thoracic aortic atherosclerosis. The central airways are clear. Major airways are patent with mild generalized airway thickening. No mediastinal, hilar or axillary lymphadenopathy. LUNGS AND PLEURA: Trace left pleural effusion is layering. Small but partially loculated right pleural effusion (series 3 image 38) with peripheral right upper and middle lobe lung opacity which more resembles infection than atelectasis or scarring. Bilateral  middle lobe atelectasis. Mild left lower lobe atelectasis adjacent to pleural effusion. No pneumothorax. ABDOMEN AND PELVIS: LIVER: Embolization coils at the porta hepatis are new since 2022. 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 Perihepatic free fluid is small to moderate in volume, has simple fluid density favoring transudate / ascites. GALLBLADDER AND BILE DUCTS: The gallbladder is chronically abnormal, partially contracted and with adjacent fat stranding which does not appear significantly changed from 2022. No biliary ductal dilatation. SPLEEN: Small volume perisplenic free fluid. PANCREAS: No acute  abnormality. ADRENAL GLANDS: Negative adrenal glands. KIDNEYS, URETERS AND BLADDER: Previous bilateral nephrectomy. Abnormal right lower quadrant renal transplant, nonspecific non-contrast CT appearance which might reflect rejection or pararenal hematoma. There is an adjacent percutaneous pigtail drain along the lateral aspect of the transplant, as well as adjacent nonspecific free fluid and a fluid collection along the posterior and inferior margin of the transplant (series 3 image 107, unclear whether this is hydronephrosis). Moderate transplant perinephric stranding, also nonspecific. No transplant hydroureter. Fat stranding from adjacent to the right renal transplant also extends into the space of Retzius. Chronically atrophied left lower quadrant transplant since 2022. Stable appearance of the left renal transplant ureter. The urinary bladder is mildly distended, with a similar volume to the previous CT abdomen and pelvis. No stones in the kidneys or ureters. No hydronephrosis. No perinephric or periureteral stranding. GI AND BOWEL: Nondilated stomach. Chronic left lower quadrant large bowel anastomosis appears stable with no adverse features. Upstream diverticulosis of the descending colon with no active inflammation. Large bowel retained stool elsewhere.  Normal gas containing appendix tracking to the midline on series 3 image 93. No dilated small bowel. There is no bowel obstruction. REPRODUCTIVE ORGANS: Prostatomegaly. Partially visible severe scrotal swelling, which appears to be related to right greater than left scrotal hydroceles with simple fluid density (series 4 image 16). PERITONEUM AND RETROPERITONEUM: Small to moderate volume nonspecific ascites in the abdomen and pelvis. No free air. VASCULATURE: Aorta is normal in caliber. ABDOMINAL AND PELVIS LYMPH NODES: No lymphadenopathy. BONES AND SOFT TISSUES: Mild L4 lumbar spine degeneration except for advanced chronic disc and endplate degeneration at the lumbosacral junction. Underlying right ribs appear intact. No acute or suspicious osseous lesion identified in the chest. Postoperative changes to the ventral abdominal wall with no adverse features. Superimposed chronic fat containing right inguinal hernia. No herniated bowel. No focal soft tissue abnormality. IMPRESSION: 1. Right lower quadrant renal transplant with Abnormal but nonspecific non-contrast CT appearance. Considerations include rejection, pyelonephritis or even some renal hematoma. And there is an adjacent percutaneous drain, correlate with output. 2. Small to moderate volume nonspecific ascites in the abdomen and pelvis, appears to be simple fluid density. 3. Small but partially loculated right pleural effusion with peripheral right upper and middle lobe lung opacity that more resembles infection than atelectasis or scarring. 4. Small pericardial effusion. 5. Other chronic findings, including bilateral native nephrectomy, atrophied left lower quadrant renal transplant . Electronically signed by: Helayne Hurst MD 10/13/2024 07:46 AM EST RP Workstation: [id number]   DG Chest Portable 1 View Result Date: 10/13/2024 EXAM: 1 VIEW(S) XRAY OF THE CHEST 10/13/2024 02:12:00 AM COMPARISON: 04/05/2022 CLINICAL HISTORY: Fevers FINDINGS: LUNGS AND PLEURA:  Linear scarring is again seen in the left mid lung with associated suture line. No pleural effusion. No pneumothorax. HEART AND MEDIASTINUM: Cardiomegaly. Calcified aorta. BONES AND SOFT TISSUES: No acute osseous abnormality. IMPRESSION: 1. No acute cardiopulmonary process. Electronically signed by: Oneil Devonshire MD 10/13/2024 02:32 AM EST RP Workstation: [id number]    Labs: BMET Recent Labs  Lab 10/13/24 0112 10/13/24 1030 10/14/24 0342  NA 139  --  140  K 3.5  --  3.6  CL 101  --  102  CO2 24  --  25  GLUCOSE 127*  --  176*  BUN 33*  --  34*  CREATININE 3.47* 3.30* 3.26*  CALCIUM  8.9  --  8.6*  PHOS  --   --  3.6   CBC Recent Labs  Lab 10/13/24 0112 10/13/24 1030  WBC 10.2 8.0  NEUTROABS 9.3*  --   HGB 9.1* 8.4*  HCT 29.2* 27.2*  MCV 84.6 86.6  PLT 181 169    Medications:     apixaban   5 mg Oral BID   azithromycin   500 mg Oral Daily   carvedilol   37.5 mg Oral BID WC   furosemide   80 mg Intravenous BID   insulin  aspart  0-15 Units Subcutaneous TID WC   insulin  glargine-yfgn  10 Units Subcutaneous Daily   magnesium  oxide  1,200 mg Oral TID WC   mycophenolate   360 mg Oral BID   pantoprazole   40 mg Oral q AM   predniSONE   5 mg Oral Q breakfast   rosuvastatin   10 mg Oral QHS   tacrolimus  ER  1.5 mg Oral QAC breakfast    Gordy Blanch, MD 10/14/2024, 10:28 AM

## 2024-10-15 DIAGNOSIS — R509 Fever, unspecified: Secondary | ICD-10-CM | POA: Diagnosis not present

## 2024-10-15 LAB — CBC
HCT: 25.8 % — ABNORMAL LOW (ref 39.0–52.0)
Hemoglobin: 8.2 g/dL — ABNORMAL LOW (ref 13.0–17.0)
MCH: 26.8 pg (ref 26.0–34.0)
MCHC: 31.8 g/dL (ref 30.0–36.0)
MCV: 84.3 fL (ref 80.0–100.0)
Platelets: 186 K/uL (ref 150–400)
RBC: 3.06 MIL/uL — ABNORMAL LOW (ref 4.22–5.81)
RDW: 17.6 % — ABNORMAL HIGH (ref 11.5–15.5)
WBC: 5.8 K/uL (ref 4.0–10.5)
nRBC: 0 % (ref 0.0–0.2)

## 2024-10-15 LAB — COMPREHENSIVE METABOLIC PANEL WITH GFR
ALT: 8 U/L (ref 0–44)
AST: 10 U/L — ABNORMAL LOW (ref 15–41)
Albumin: 2.8 g/dL — ABNORMAL LOW (ref 3.5–5.0)
Alkaline Phosphatase: 67 U/L (ref 38–126)
Anion gap: 12 (ref 5–15)
BUN: 31 mg/dL — ABNORMAL HIGH (ref 8–23)
CO2: 26 mmol/L (ref 22–32)
Calcium: 8.8 mg/dL — ABNORMAL LOW (ref 8.9–10.3)
Chloride: 102 mmol/L (ref 98–111)
Creatinine, Ser: 3.23 mg/dL — ABNORMAL HIGH (ref 0.61–1.24)
GFR, Estimated: 20 mL/min — ABNORMAL LOW (ref >60)
Glucose, Bld: 218 mg/dL — ABNORMAL HIGH (ref 70–99)
Potassium: 3.1 mmol/L — ABNORMAL LOW (ref 3.5–5.1)
Sodium: 140 mmol/L (ref 135–145)
Total Bilirubin: 0.6 mg/dL (ref 0.0–1.2)
Total Protein: 5.7 g/dL — ABNORMAL LOW (ref 6.5–8.1)

## 2024-10-15 LAB — GLUCOSE, CAPILLARY
Glucose-Capillary: 198 mg/dL — ABNORMAL HIGH (ref 70–99)
Glucose-Capillary: 183 mg/dL — ABNORMAL HIGH (ref 70–99)
Glucose-Capillary: 268 mg/dL — ABNORMAL HIGH (ref 70–99)
Glucose-Capillary: 300 mg/dL — ABNORMAL HIGH (ref 70–99)

## 2024-10-15 LAB — IRON AND TIBC
Iron: 40 ug/dL — ABNORMAL LOW (ref 45–182)
Saturation Ratios: 20 % (ref 17.9–39.5)
TIBC: 199 ug/dL — ABNORMAL LOW (ref 250–450)
UIBC: 159 ug/dL

## 2024-10-15 LAB — C DIFFICILE QUICK SCREEN W PCR REFLEX
C Diff antigen: NEGATIVE
C Diff interpretation: NOT DETECTED
C Diff toxin: NEGATIVE

## 2024-10-15 LAB — PHOSPHORUS: Phosphorus: 2.9 mg/dL (ref 2.5–4.6)

## 2024-10-15 LAB — MAGNESIUM: Magnesium: 1.6 mg/dL — ABNORMAL LOW (ref 1.7–2.4)

## 2024-10-15 LAB — GLUCOSE 6 PHOSPHATE DEHYDROGENASE
G6PDH: 11 U/g{Hb} (ref 4.8–15.7)
Hemoglobin: 8.4 g/dL — ABNORMAL LOW (ref 13.0–17.7)

## 2024-10-15 LAB — TACROLIMUS LEVEL: Tacrolimus (FK506) - LabCorp: 8.7 ng/mL (ref 5.0–20.0)

## 2024-10-15 MED ORDER — SULFAMETHOXAZOLE-TRIMETHOPRIM 400-80 MG PO TABS
1.0000 | ORAL_TABLET | Freq: Every day | ORAL | Status: DC
  Administered 2024-10-15 – 2024-10-16 (×2): 1 via ORAL
  Filled 2024-10-15 (×2): qty 1

## 2024-10-15 MED ORDER — MAGNESIUM SULFATE 2 GM/50ML IV SOLN
2.0000 g | Freq: Once | INTRAVENOUS | Status: AC
  Administered 2024-10-15: 2 g via INTRAVENOUS
  Filled 2024-10-15: qty 50

## 2024-10-15 MED ORDER — APIXABAN 5 MG PO TABS
5.0000 mg | ORAL_TABLET | Freq: Two times a day (BID) | ORAL | Status: DC
  Administered 2024-10-15 – 2024-10-16 (×3): 5 mg via ORAL
  Filled 2024-10-15 (×3): qty 1

## 2024-10-15 MED ORDER — MAGNESIUM OXIDE -MG SUPPLEMENT 400 (240 MG) MG PO TABS
1200.0000 mg | ORAL_TABLET | Freq: Three times a day (TID) | ORAL | Status: DC
  Administered 2024-10-16 (×2): 1200 mg via ORAL
  Filled 2024-10-15 (×2): qty 3

## 2024-10-15 MED ORDER — SODIUM CHLORIDE 0.9 % IV SOLN: INTRAVENOUS | Status: DC

## 2024-10-15 MED ORDER — AZITHROMYCIN 500 MG PO TABS
500.0000 mg | ORAL_TABLET | Freq: Every day | ORAL | Status: AC
  Administered 2024-10-15: 500 mg via ORAL
  Filled 2024-10-15: qty 1

## 2024-10-15 MED ORDER — INSULIN GLARGINE-YFGN 100 UNIT/ML ~~LOC~~ SOLN
15.0000 [IU] | Freq: Every day | SUBCUTANEOUS | Status: DC
  Administered 2024-10-15 – 2024-10-16 (×2): 15 [IU] via SUBCUTANEOUS
  Filled 2024-10-15 (×2): qty 0.15

## 2024-10-15 MED ORDER — PREDNISONE 5 MG PO TABS
5.0000 mg | ORAL_TABLET | Freq: Every day | ORAL | Status: DC
  Administered 2024-10-15 – 2024-10-16 (×2): 5 mg via ORAL
  Filled 2024-10-15: qty 1

## 2024-10-15 MED ORDER — MYCOPHENOLATE SODIUM 180 MG PO TBEC
360.0000 mg | DELAYED_RELEASE_TABLET | Freq: Two times a day (BID) | ORAL | Status: DC
  Administered 2024-10-15 – 2024-10-16 (×3): 360 mg via ORAL
  Filled 2024-10-15 (×3): qty 2

## 2024-10-15 MED ORDER — PANTOPRAZOLE SODIUM 40 MG PO TBEC
40.0000 mg | DELAYED_RELEASE_TABLET | Freq: Every morning | ORAL | Status: DC
  Administered 2024-10-15 – 2024-10-16 (×2): 40 mg via ORAL
  Filled 2024-10-15 (×2): qty 1

## 2024-10-15 MED ORDER — LOPERAMIDE HCL 2 MG PO CAPS
2.0000 mg | ORAL_CAPSULE | ORAL | Status: DC | PRN
Start: 2024-10-15 — End: 2024-10-16

## 2024-10-15 MED ORDER — POTASSIUM CHLORIDE CRYS ER 20 MEQ PO TBCR
40.0000 meq | EXTENDED_RELEASE_TABLET | Freq: Once | ORAL | Status: AC
  Administered 2024-10-15: 40 meq via ORAL
  Filled 2024-10-15: qty 2

## 2024-10-15 MED ORDER — INSULIN ASPART 100 UNIT/ML IJ SOLN
3.0000 [IU] | Freq: Three times a day (TID) | INTRAMUSCULAR | Status: DC
  Administered 2024-10-15 – 2024-10-16 (×5): 3 [IU] via SUBCUTANEOUS
  Filled 2024-10-15 (×5): qty 3

## 2024-10-15 MED ORDER — TORSEMIDE 20 MG PO TABS
60.0000 mg | ORAL_TABLET | Freq: Every day | ORAL | Status: DC
  Administered 2024-10-15 – 2024-10-16 (×2): 60 mg via ORAL
  Filled 2024-10-15 (×2): qty 3

## 2024-10-15 MED ORDER — LETERMOVIR 480 MG PO TABS
480.0000 mg | ORAL_TABLET | Freq: Every day | ORAL | Status: DC
  Administered 2024-10-15 – 2024-10-16 (×2): 480 mg via ORAL
  Filled 2024-10-15: qty 1

## 2024-10-15 MED ORDER — EMPAGLIFLOZIN 10 MG PO TABS
10.0000 mg | ORAL_TABLET | Freq: Every day | ORAL | Status: DC
  Administered 2024-10-15 – 2024-10-16 (×2): 10 mg via ORAL
  Filled 2024-10-15 (×2): qty 1

## 2024-10-15 NOTE — Progress Notes (Signed)
  Progress Note   Patient: Nicholas Glenn FMW:[id number] DOB: 03-Mar-1956 DOA: 10/13/2024     2 DOS: the patient was seen and examined on 10/15/2024 at 9:25AM      Brief hospital course: 68 y.o. 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     Assessment and Plan: Pneumonia sepsis Fever resolved, white blood cell count normal, vital signs normal, blood cultures normal. - Continue Rocephin  and azithromycin , day 3 of 5 -Continue tacrolimus , letermovir , Bactrim , Myfortic , and prednisone   Acute kidney injury on CKD stage IIIb History of renal transplant Polycystic kidney disease Creatinine unchanged - Defer diuretics to nephrology  Acute on chronic systolic and diastolic congestive heart failure -Carvedilol , Jardiance , Crestor , torsemide   Paroxysmal atrial fibrillation -Continue Eliquis , carvedilol   Hyperlipidemia -Continue Crestor   Anemia Due to kidney disease, stable  Diabetes Glucose close to normal - Continue glargine - Continue sliding scale corrections - Continue Jardiance         Subjective: The patient had some arm discomfort during magnesium  infusion, otherwise has felt okay     Physical Exam: BP (!) 141/64   Pulse 83   Temp 98.3 F (36.8 C) (Oral)   Resp 17   Ht 5' 9 (1.753 m)   Wt 79.2 kg   SpO2 97%   BMI 25.78 kg/m   Adult male, lying in bed, interactive and appropriate RRR, no murmurs, no peripheral edema Respiratory rate normal, lungs clear without rales or wheezes Abdomen soft no tenderness palpation or guarding, no ascites or distention Attention normal, affect normal, judgment and insight appear normal  Data Reviewed: Discussed with nephrology Basic metabolic panel shows persistent renal insufficiency, normal electrolytes CBC shows anemia, unchanged     Family  Communication: Wife at the bedside    Disposition: Status is: Inpatient         Author: Lonni SHAUNNA Dalton, MD 10/15/2024 4:23 PM  For on call review [email or web address].

## 2024-10-15 NOTE — Plan of Care (Signed)
  Problem: Education: Goal: Ability to describe self-care measures that may prevent or decrease complications (Diabetes Survival Skills Education) will improve Outcome: Progressing   Problem: Coping: Goal: Ability to adjust to condition or change in health will improve Outcome: Progressing   Problem: Fluid Volume: Goal: Ability to maintain a balanced intake and output will improve Outcome: Progressing   Problem: Health Behavior/Discharge Planning: Goal: Ability to identify and utilize available resources and services will improve Outcome: Progressing   Problem: Metabolic: Goal: Ability to maintain appropriate glucose levels will improve Outcome: Progressing   Problem: Nutritional: Goal: Maintenance of adequate nutrition will improve Outcome: Progressing   Problem: Skin Integrity: Goal: Risk for impaired skin integrity will decrease Outcome: Progressing   Problem: Tissue Perfusion: Goal: Adequacy of tissue perfusion will improve Outcome: Progressing   Problem: Education: Goal: Knowledge of General Education information will improve Description: Including pain rating scale, medication(s)/side effects and non-pharmacologic comfort measures Outcome: Progressing   Problem: Health Behavior/Discharge Planning: Goal: Ability to manage health-related needs will improve Outcome: Progressing   Problem: Clinical Measurements: Goal: Ability to maintain clinical measurements within normal limits will improve Outcome: Progressing Goal: Respiratory complications will improve Outcome: Progressing Goal: Cardiovascular complication will be avoided Outcome: Progressing   Problem: Activity: Goal: Risk for activity intolerance will decrease Outcome: Progressing   Problem: Nutrition: Goal: Adequate nutrition will be maintained Outcome: Progressing   Problem: Coping: Goal: Level of anxiety will decrease Outcome: Progressing   Problem: Elimination: Goal: Will not experience  complications related to bowel motility Outcome: Progressing   Problem: Safety: Goal: Ability to remain free from injury will improve Outcome: Progressing   Problem: Skin Integrity: Goal: Risk for impaired skin integrity will decrease Outcome: Progressing

## 2024-10-15 NOTE — Progress Notes (Signed)
 Patient ID: Nicholas Glenn, male   DOB: 1956-03-07, 68 y.o.   MRN: [id number] [facility] Progress Note   Assessment/ Plan:   1.  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  Baseline creatinine after renal transplant appears to have been recently around 2.6 following recent admission for management of congestive heart failure exacerbation.  Recent renal allograft biopsy negative for obstruction but showed features of ATN.  Remains nonoliguric overnight with ongoing intravenous furosemide  and renal function essentially unchanged.  Convert to oral torsemide  today. 2.  Status post cadaveric kidney transplant (07/10/2024): This was done at the [facility] [facility] and he has been on maintenance immunosuppression with Envarsus  1.5 mg daily, MMF 360 mg twice daily and prednisone  5 mg daily along with PJP prophylaxis with Bactrim  SS once a day and letermovir  480 mg daily for CMV prophylaxis. 2.  Right upper lobe pneumonia/sepsis: Started on treatment with azithromycin  and ceftriaxone  for community-acquired pneumonia based on suspicious finding on CT imaging.   Recent CMV PCR negative.  Blood cultures negative to date with negative respiratory viral panel. 3.  Congestive heart failure with reduced ejection fraction: Without evidence of florid volume overload and was started on IV Lasix  80 mg twice a day by cardiology yesterday (previously on torsemide  60 mg daily following recent hospitalization at [facility]).  Will convert back to oral torsemide  60 mg daily today after obtaining weight. 4.  Anemia: Secondary to acute illness, recent hospitalization/frequent blood draws in this patient who is only 3 months status post renal transplant.  Will check iron  studies today.   Subjective:   Remained afebrile overnight  however developed diarrhea (on enteric precautions until C. difficile testing returns).  Feels that he is dry.   Objective:   BP (!) 141/64   Pulse 83   Temp 98.3 F (36.8 C) (Oral)   Resp 17   Ht 5' 9 (1.753 m)   Wt 80.8 kg   SpO2 97%   BMI 26.31 kg/m   Intake/Output Summary (Last 24 hours) at 10/15/2024 1018 Last data filed at 10/15/2024 0900 Gross per 24 hour  Intake 118 ml  Output 2200 ml  Net -2082 ml   Weight change:   Physical Exam: Gen: Comfortably resting in recliner, no apparent distress CVS: Pulse regular rhythm, normal rate, S1 and S2 normal Resp: Fine rales right base otherwise clear to auscultation Abd: Soft, obese, no tenderness over renal allograft (RLQ) with percutaneous drain on right side Ext: No palpable right lower extremity edema.  Left leg in compression stocking.  Imaging: ECHOCARDIOGRAM COMPLETE Result Date: 10/13/2024    ECHOCARDIOGRAM REPORT   Patient Name:   Nicholas Glenn Date of Exam: 10/13/2024 Medical Rec #:  [id number]     Height:       69.0 in Accession #:    [id number]    Weight:       178.8 lb Date of Birth:  February 10, 1956    BSA:          1.970 m Patient Age:    67 years      BP:           119/64 mmHg Patient Gender: M             HR:           76 bpm. Exam Location:  Inpatient Procedure: 2D  Echo (Both Spectral and Color Flow Doppler were utilized during            procedure). Indications:    CHF  History:        Patient has prior history of Echocardiogram examinations. CHF.  Sonographer:    Norleen Amour Referring Phys: [id number] MARSHA ADA IMPRESSIONS  1. 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 Left ventricular ejection fraction, by estimation, is 45 to 50%. The left ventricle has mildly decreased function. The left ventricle demonstrates global hypokinesis. Left ventricular diastolic parameters are consistent with Grade III diastolic dysfunction (restrictive). The  average left ventricular global longitudinal strain is -8.7 %. The global longitudinal strain is abnormal.  2. Right ventricular systolic function is normal. The right ventricular size is mildly enlarged.  3. Left atrial size was moderately dilated.  4. There is no evidence of cardiac tamponade.  5. The mitral valve is normal in structure. Mild mitral valve regurgitation. No evidence of mitral stenosis.  6. The aortic valve is tricuspid. Aortic valve regurgitation is not visualized. Aortic valve sclerosis/calcification is present, without any evidence of aortic stenosis.  7. The inferior vena cava is normal in size with greater than 50% respiratory variability, suggesting right atrial pressure of 3 mmHg. 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 Left ventricular ejection fraction, by estimation, is 45 to 50%. The left ventricle has mildly decreased function. The  left ventricle demonstrates global hypokinesis. The average left ventricular global longitudinal strain is -8.7 %. Strain was performed and the global longitudinal strain is abnormal. The left ventricular internal cavity size was normal in size. There is no left ventricular hypertrophy. Left ventricular diastolic parameters are consistent with Grade III diastolic dysfunction (restrictive). Right Ventricle: The right ventricular size is mildly enlarged. No increase in right ventricular wall thickness. Right ventricular systolic function is normal. Left Atrium: Left atrial size was moderately dilated. Right Atrium: Right atrial size was normal in size. Pericardium: Trivial pericardial effusion is present. There is no evidence of cardiac tamponade. Mitral Valve: The mitral valve is normal in structure. Mild mitral valve regurgitation. No evidence of mitral valve stenosis. MV peak gradient, 4.1 mmHg. The mean mitral valve gradient is 2.0 mmHg. Tricuspid Valve:  The tricuspid valve is normal in structure. Tricuspid valve regurgitation is not demonstrated. No evidence of tricuspid stenosis. Aortic Valve: The aortic valve is tricuspid. Aortic valve regurgitation is not visualized. Aortic valve sclerosis/calcification is present, without any evidence of aortic stenosis. Aortic valve mean gradient measures 4.0 mmHg. Aortic valve peak gradient measures 8.3 mmHg. Aortic valve area, by VTI measures 1.50 cm. Pulmonic Valve: The pulmonic valve was normal in structure. Pulmonic valve regurgitation is not visualized. No evidence of pulmonic stenosis. Aorta: The aortic root is normal in size and structure. Venous: The inferior vena cava is normal in size with greater than 50% respiratory variability, suggesting right atrial pressure of 3 mmHg. IAS/Shunts: No atrial level shunt detected by color flow Doppler.  LEFT VENTRICLE PLAX 2D LVIDd:         5.60 cm      Diastology LVIDs:         3.60 cm      LV e' medial:    4.79 cm/s LV PW:         0.90 cm      LV E/e' medial:  20.9 LV IVS:  0.90 cm      LV e' lateral:   6.53 cm/s LVOT diam:     1.90 cm      LV E/e' lateral: 15.3 LV SV:         42 LV SV Index:   21           2D Longitudinal Strain LVOT Area:     2.84 cm     2D Strain GLS Avg:     -8.7 % LV IVRT:       58 msec  LV Volumes (MOD) LV vol d, MOD A2C: 185.0 ml LV vol d, MOD A4C: 178.0 ml LV vol s, MOD A2C: 94.6 ml LV vol s, MOD A4C: 87.2 ml LV SV MOD A2C:     90.4 ml LV SV MOD A4C:     178.0 ml LV SV MOD BP:      98.3 ml RIGHT VENTRICLE             IVC RV Basal diam:  4.10 cm     IVC diam: 1.30 cm RV S prime:     15.80 cm/s TAPSE (M-mode): 2.5 cm      PULMONARY VEINS                             Diastolic Velocity: 76.90 cm/s                             S/D Velocity:       0.40                             Systolic Velocity:  33.10 cm/s LEFT ATRIUM             Index        RIGHT ATRIUM           Index LA diam:        4.30 cm 2.18 cm/m   RA Area:     18.20 cm LA Vol (A2C):    76.3 ml 38.73 ml/m  RA Volume:   53.20 ml  27.01 ml/m LA Vol (A4C):   78.2 ml 39.70 ml/m LA Biplane Vol: 81.6 ml 41.42 ml/m  AORTIC VALVE                    PULMONIC VALVE AV Area (Vmax):    1.57 cm     PV Vmax:       0.84 m/s AV Area (Vmean):   1.53 cm     PV Peak grad:  2.8 mmHg AV Area (VTI):     1.50 cm AV Vmax:           144.00 cm/s AV Vmean:          99.400 cm/s AV VTI:            0.282 m AV Peak Grad:      8.3 mmHg AV Mean Grad:      4.0 mmHg LVOT Vmax:         79.80 cm/s LVOT Vmean:        53.700 cm/s LVOT VTI:          0.149 m LVOT/AV VTI ratio: 0.53  AORTA Ao Root diam: 2.90 cm Ao Asc diam:  3.00 cm MITRAL VALVE MV Area (PHT): 5.02 cm     SHUNTS  MV Area VTI:   1.52 cm     Systemic VTI:  0.15 m MV Peak grad:  4.1 mmHg     Systemic Diam: 1.90 cm MV Mean grad:  2.0 mmHg MV Vmax:       1.01 m/s MV Vmean:      60.1 cm/s MV Decel Time: 151 msec MV E velocity: 100.00 cm/s MV A velocity: 40.90 cm/s MV E/A ratio:  2.44 Morene Brownie Electronically signed by Morene Brownie Signature Date/Time: 10/13/2024/3:42:39 PM    Final    US  Renal Transplant w/Doppler Result Date: 10/13/2024 CLINICAL DATA:  Acute renal insufficiency. EXAM: ULTRASOUND OF RENAL TRANSPLANT WITH RENAL DOPPLER ULTRASOUND TECHNIQUE: Ultrasound examination of the renal transplant was performed with gray-scale, color and duplex doppler evaluation. COMPARISON:  None Available. FINDINGS: Transplant kidney location: Right lower quadrant Transplant Kidney: Renal measurements: 11.7 x 7.8 x 6.2 cm = volume: . There is mild diffuse increased renal parenchymal echogenicity. There is moderate hydronephrosis of the transplant kidney with possible thickening of the urothelium of the collecting system versus debris. Correlation with urinalysis recommended to exclude pyelonephritis. No peritransplant fluid collection. There is a 2 cm upper pole cyst. Color flow in the main renal artery:  Yes Color flow in the main renal vein:  Yes Duplex  Doppler Evaluation: Main Renal Artery Velocity: 77 cm/sec Main Renal Artery Resistive Index: 0.8 Venous waveform in main renal vein:  Present Intrarenal resistive index in upper pole:  0.65 (normal 0.6-0.8; equivocal 0.8-0.9; abnormal >= 0.9) Intrarenal resistive index in lower pole: 0.8 (normal 0.6-0.8; equivocal 0.8-0.9; abnormal >= 0.9) Bladder: Normal for degree of bladder distention. Other findings:  None. IMPRESSION: 1. Moderate hydronephrosis of the transplant kidney. Correlation with urinalysis recommended to evaluate for pyelonephritis. 2. Borderline elevated resistive indices in the main renal artery and lower pole of the transplant kidney. Electronically Signed   By: Vanetta Chou M.D.   On: 10/13/2024 12:50    Labs: BMET Recent Labs  Lab 10/13/24 0112 10/13/24 1030 10/14/24 0342 10/15/24 0308  NA 139  --  140 140  K 3.5  --  3.6 3.1*  CL 101  --  102 102  CO2 24  --  25 26  GLUCOSE 127*  --  176* 218*  BUN 33*  --  34* 31*  CREATININE 3.47* 3.30* 3.26* 3.23*  CALCIUM  8.9  --  8.6* 8.8*  PHOS  --   --  3.6 2.9   CBC Recent Labs  Lab 10/13/24 0112 10/13/24 1030 10/15/24 0308  WBC 10.2 8.0 5.8  NEUTROABS 9.3*  --   --   HGB 9.1* 8.4* 8.2*  HCT 29.2* 27.2* 25.8*  MCV 84.6 86.6 84.3  PLT 181 169 186    Medications:     apixaban   5 mg Oral BID   carvedilol   37.5 mg Oral BID WC   empagliflozin   10 mg Oral Daily   insulin  aspart  0-15 Units Subcutaneous TID WC   insulin  aspart  3 Units Subcutaneous TID WC   insulin  glargine-yfgn  15 Units Subcutaneous Daily   Letermovir   480 mg Oral Daily   [START ON 10/16/2024] magnesium  oxide  1,200 mg Oral TID WC   mycophenolate   360 mg Oral BID   pantoprazole   40 mg Oral q AM   predniSONE   5 mg Oral Q breakfast   rosuvastatin   10 mg Oral QHS   sulfamethoxazole -trimethoprim   1 tablet Oral Daily   tacrolimus  ER  1.5 mg Oral QAC breakfast  Gordy Blanch, MD 10/15/2024, 10:18 AM

## 2024-10-15 NOTE — Plan of Care (Signed)
   Problem: Education: Goal: Ability to describe self-care measures that may prevent or decrease complications (Diabetes Survival Skills Education) will improve Outcome: Completed/Met

## 2024-10-15 NOTE — Hospital Course (Addendum)
 68 y.o. 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

## 2024-10-16 DIAGNOSIS — R509 Fever, unspecified: Secondary | ICD-10-CM | POA: Diagnosis not present

## 2024-10-16 LAB — RENAL FUNCTION PANEL
Albumin: 2.9 g/dL — ABNORMAL LOW (ref 3.5–5.0)
Anion gap: 12 (ref 5–15)
BUN: 30 mg/dL — ABNORMAL HIGH (ref 8–23)
CO2: 23 mmol/L (ref 22–32)
Calcium: 8.9 mg/dL (ref 8.9–10.3)
Chloride: 104 mmol/L (ref 98–111)
Creatinine, Ser: 2.69 mg/dL — ABNORMAL HIGH (ref 0.61–1.24)
GFR, Estimated: 25 mL/min — ABNORMAL LOW (ref >60)
Glucose, Bld: 164 mg/dL — ABNORMAL HIGH (ref 70–99)
Phosphorus: 2.5 mg/dL (ref 2.5–4.6)
Potassium: 3.4 mmol/L — ABNORMAL LOW (ref 3.5–5.1)
Sodium: 139 mmol/L (ref 135–145)

## 2024-10-16 LAB — CBC
HCT: 28.2 % — ABNORMAL LOW (ref 39.0–52.0)
Hemoglobin: 8.7 g/dL — ABNORMAL LOW (ref 13.0–17.0)
MCH: 26.4 pg (ref 26.0–34.0)
MCHC: 30.9 g/dL (ref 30.0–36.0)
MCV: 85.5 fL (ref 80.0–100.0)
Platelets: 233 K/uL (ref 150–400)
RBC: 3.3 MIL/uL — ABNORMAL LOW (ref 4.22–5.81)
RDW: 17.4 % — ABNORMAL HIGH (ref 11.5–15.5)
WBC: 6.9 K/uL (ref 4.0–10.5)
nRBC: 0 % (ref 0.0–0.2)

## 2024-10-16 LAB — PROTEIN / CREATININE RATIO, URINE
Creatinine, Urine: 83 mg/dL
Protein Creatinine Ratio: 0.95 mg/mg{creat} — ABNORMAL HIGH (ref 0.00–0.15)
Total Protein, Urine: 79 mg/dL

## 2024-10-16 LAB — URINALYSIS, ROUTINE W REFLEX MICROSCOPIC
Bacteria, UA: NONE SEEN
Bilirubin Urine: NEGATIVE
Glucose, UA: >=500 mg/dL — AB
Ketones, ur: NEGATIVE mg/dL
Leukocytes,Ua: NEGATIVE
Nitrite: NEGATIVE
Protein, ur: 30 mg/dL — AB
Specific Gravity, Urine: 1.015 (ref 1.005–1.030)
pH: 6 (ref 5.0–8.0)

## 2024-10-16 LAB — GLUCOSE, CAPILLARY
Glucose-Capillary: 232 mg/dL — ABNORMAL HIGH (ref 70–99)
Glucose-Capillary: 237 mg/dL — ABNORMAL HIGH (ref 70–99)

## 2024-10-16 LAB — MAGNESIUM: Magnesium: 2.1 mg/dL (ref 1.7–2.4)

## 2024-10-16 MED ORDER — AZITHROMYCIN 250 MG PO TABS: 250.0000 mg | ORAL_TABLET | Freq: Every day | ORAL | 0 refills | Status: AC

## 2024-10-16 MED ORDER — LOPERAMIDE HCL 2 MG PO CAPS: 2.0000 mg | ORAL_CAPSULE | ORAL | Status: AC | PRN

## 2024-10-16 MED ORDER — CEFDINIR 300 MG PO CAPS: 300.0000 mg | ORAL_CAPSULE | Freq: Every day | ORAL | 0 refills | Status: AC

## 2024-10-16 MED ORDER — IRON SUCROSE 300 MG IVPB - SIMPLE MED
300.0000 mg | Freq: Once | Status: AC
  Administered 2024-10-16: 300 mg via INTRAVENOUS
  Filled 2024-10-16: qty 300

## 2024-10-16 NOTE — Progress Notes (Signed)
 Patient ID: Nicholas Glenn, male   DOB: Jul 22, 1956, 68 y.o.   MRN: [id number] [facility] Progress Note   Assessment/ Plan:   1.  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  Baseline creatinine after renal transplant appears to have been recently around 2.6 following recent admission for management of congestive heart failure exacerbation.  Recent renal allograft biopsy negative for obstruction but showed features of ATN.  Nonoliguric overnight with improvement in renal function seen on labs.  Tacrolimus  level slightly elevated on previous labs, I have reached out to transplant nephrology at [facility] to help guide additional Envarsus  dosing. 2.  Status post cadaveric kidney transplant (07/10/2024): This was done at the [facility] [facility] and he has been on maintenance immunosuppression with Envarsus  1.5 mg daily, MMF 360 mg twice daily and prednisone  5 mg daily along with PJP prophylaxis with Bactrim  SS once a day and letermovir  480 mg daily for CMV prophylaxis. 2.  Right upper lobe pneumonia/sepsis: Started on treatment with azithromycin  and ceftriaxone  for community-acquired pneumonia based on suspicious finding on CT imaging.   Recent CMV PCR negative.  Blood cultures negative to date with negative respiratory viral panel. 3.  Congestive heart failure with reduced ejection fraction: Without evidence of florid volume overload and was converted back to oral torsemide  60 mg daily yesterday after transiently being on intravenous furosemide . 4.  Anemia: Secondary to acute illness, recent hospitalization/frequent blood draws in this patient who is only 3 months status post renal transplant.  Will give intravenous iron  today before discharge.   Subjective:   Without acute events/concern overnight    Objective:   BP (!) 141/64   Pulse 82   Temp 97.6 F (36.4 C)   Resp 18   Ht 5' 9 (1.753 m)   Wt 79.2 kg   SpO2 99%   BMI 25.78 kg/m   Intake/Output Summary (Last 24 hours) at 10/16/2024 1007 Last data filed at 10/16/2024 0600 Gross per 24 hour  Intake 218.14 ml  Output 575 ml  Net -356.86 ml   Weight change: 0.5 kg  Physical Exam: Gen: Comfortably resting in recliner, no apparent distress CVS: Pulse regular rhythm, normal rate, S1 and S2 normal Resp: Fine rales right base otherwise clear to auscultation Abd: Soft, obese, no tenderness over renal allograft (RLQ) with percutaneous drain on right side Ext: No palpable right lower extremity edema.  Left leg in compression stocking.  Imaging: No results found.   Labs: BMET Recent Labs  Lab 10/13/24 0112 10/13/24 1030 10/14/24 0342 10/15/24 0308 10/16/24 0343  NA 139  --  140 140 139  K 3.5  --  3.6 3.1* 3.4*  CL 101  --  102 102 104  CO2 24  --  25 26 23   GLUCOSE 127*  --  176* 218* 164*  BUN 33*  --  34* 31* 30*  CREATININE 3.47* 3.30* 3.26* 3.23* 2.69*  CALCIUM  8.9  --  8.6* 8.8* 8.9  PHOS  --   --  3.6 2.9 2.5   CBC Recent Labs  Lab 10/13/24 0112 10/13/24 1030 10/14/24 0946 10/15/24 0308 10/16/24 0343  WBC 10.2 8.0  --  5.8 6.9  NEUTROABS 9.3*  --   --   --   --   HGB 9.1* 8.4* 8.4* 8.2* 8.7*  HCT 29.2* 27.2*  --  25.8*  28.2*  MCV 84.6 86.6  --  84.3 85.5  PLT 181 169  --  186 233    Medications:     apixaban   5 mg Oral BID   carvedilol   37.5 mg Oral BID WC   empagliflozin   10 mg Oral Daily   insulin  aspart  0-15 Units Subcutaneous TID WC   insulin  aspart  3 Units Subcutaneous TID WC   insulin  glargine-yfgn  15 Units Subcutaneous Daily   Letermovir   480 mg Oral Daily   magnesium  oxide  1,200 mg Oral TID WC   mycophenolate   360 mg Oral BID   pantoprazole   40 mg Oral q AM   predniSONE   5 mg Oral Q breakfast   rosuvastatin   10 mg Oral QHS   sulfamethoxazole -trimethoprim   1 tablet Oral  Daily   tacrolimus  ER  1.5 mg Oral QAC breakfast   torsemide   60 mg Oral Daily    Gordy Blanch, MD 10/16/2024, 10:07 AM

## 2024-10-16 NOTE — Discharge Summary (Signed)
 Physician Discharge Summary   Patient: Nicholas Glenn MRN: [id number] DOB: 03-03-1956  Admit date:     10/13/2024  Discharge date: 10/16/24  Discharge Physician: Lonni SHAUNNA Dalton   PCP: Fernando Jansky, MD     Recommendations at discharge:  Follow up with Dr. Ronalee Renal Transplant on Monday Dec 1 Please check CBC and BMP in 4 days Please follow up Tacrolimus  level from 11/27     Discharge Diagnoses: Principal Problem:   Sepsis due to right upper lobe pneumonia, unknown organism Active Problems:   Acute kidney injury on CKD stage IIIb   Polycystic kidney disease   History of renal transplant   Acute on chronic systolic and diastolic congestive  heart failure   History of SVT   Paroxysmal atrial fibrillation   Hyperlipidemia   Type 2 diabetes    Hospital Course: 68 y.o. 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     Suspected to be right upper lobe pneumonia Presented with fever 104.24F, tachycardia, chest x-ray showing right upper lobe opacity.  Developed endorgan damage with AKI, CHF.  Blood cultures no growth, 20 pathogen respiratory PCR panel, COVID PCR negative.  Recent CMP plasma PCR negative.  C. difficile negative.  Procalcitonin here 8.  Treated with Rocephin  and azithromycin , fever resolved. Patient now mentating at baseline, taking orals.  Temp < 100 F, heart rate < 100bpm, RR < 24, SpO2 at baseline.   Stable for discharge to complete 7 days with cefdinir  and 5 days azithromycin .  Acute kidney injury on CKD stage IIIb History of renal transplant Autosomal dominant polycystic kidney disease Patient was admitted with creatinine greater than 3.4, nephrology consulted.  Baseline creatinine after recent renal transplant appears to have been around 2.6 after his recent admission for CHF at  [facility].  He did have a renal allograft biopsy earlier this admission that showed some features of ATN.  Here he was nonoliguric.  Tacrolimus  level was slightly elevated.  Nephrology spoke with transplant nephrology at [facility], who recommended continued Envarsus .  Repeat tacrolimus  level pending at the time of discharge.  He has close follow-up with his transplant nephrologist this coming Monday.    Acute on chronic systolic congestive heart failure Cardiology were consulted, he was treated briefly with IV diuretics, but symptoms resolved, he was transition back to oral torsemide  24 hours prior to discharge, and creatinine was improving.  History of paroxysmal atrial fibrillation and SVT Stable on apixaban  and Coreg   Hyperlipidemia Stable on statin  Diabetes with hyperglycemia Glucoses normal as of the time of discharge       The [place]  Controlled Substances Registry was reviewed for this patient prior to discharge.   Consultants: Nephrology Cardiology   Disposition: Home Diet recommendation:  Discharge Diet Orders (From admission, onward)     Start     Ordered   10/16/24 0000  Diet - low sodium heart healthy        10/16/24 1045             DISCHARGE MEDICATION: Allergies as of 10/16/2024       Reactions   Bee Venom Swelling   Vancomycin  Other (See Comments)   Flushing - resolves when infusion is slow        Medication List     TAKE these medications    acetaminophen  500 MG tablet  Commonly known as: TYLENOL  Take 1,000 mg by mouth every 6 (six) hours as needed for mild pain.   azithromycin  250 MG tablet Commonly known as: Zithromax  Z-Pak Take 1 tablet (250 mg total) by mouth daily.   carvedilol  12.5 MG tablet Commonly known as: COREG  Take 37.5 mg by mouth 2 (two) times daily with a meal.   cefdinir  300 MG capsule Commonly known as: OMNICEF  Take 1 capsule (300 mg total) by mouth daily.   cyanocobalamin 1000 MCG tablet Commonly known as:  VITAMIN B12 Take 1,000 mcg by mouth every morning.   doxercalciferol  0.5 MCG capsule Commonly known as: HECTOROL  Take 0.5 mcg by mouth every Monday, Wednesday, and Friday.   Eliquis  5 MG Tabs tablet Generic drug: apixaban  Take 5 mg by mouth 2 (two) times daily.   empagliflozin  25 MG Tabs tablet Commonly known as: JARDIANCE  Take 12.5 mg by mouth daily.   Envarsus  XR 0.75 MG Tb24 tablet Generic drug: tacrolimus  ER Take 1.5 mg by mouth daily before breakfast.   HumaLOG KwikPen 100 UNIT/ML KwikPen Generic drug: insulin  lispro Inject 3-18 Units into the skin 3 (three) times daily with meals.   Lantus  SoloStar 100 UNIT/ML Solostar Pen Generic drug: insulin  glargine Inject 15 Units into the skin in the morning.   loperamide  2 MG capsule Commonly known as: IMODIUM  Take 1 capsule (2 mg total) by mouth as needed for diarrhea or loose stools.   magnesium  oxide 400 (240 Mg) MG tablet Commonly known as: MAG-OX Take 1,200 mg by mouth 3 (three) times daily with meals.   mycophenolate  180 MG EC tablet Commonly known as: MYFORTIC  Take 360 mg by mouth 2 (two) times daily.   NON FORMULARY CPAP at bedtime   pantoprazole  40 MG tablet Commonly known as: PROTONIX  Take 40 mg by mouth in the morning.   potassium chloride  SA 20 MEQ tablet Commonly known as: KLOR-CON  M Take 20 mEq by mouth 2 (two) times daily.   predniSONE  5 MG tablet Commonly known as: DELTASONE  Take 5 mg by mouth daily with breakfast. Continuously   Prevymis  480 MG Tabs Generic drug: Letermovir  Take 480 mg by mouth daily.   rosuvastatin  10 MG tablet Commonly known as: CRESTOR  Take 10 mg by mouth at bedtime.   sulfamethoxazole -trimethoprim  400-80 MG tablet Commonly known as: BACTRIM  Take 1 tablet by mouth daily.   torsemide  20 MG tablet Commonly known as: DEMADEX  Take 60 mg by mouth daily.        Follow-up Information     Connect with your PCP/Specialist as discussed. Schedule an appointment as soon  as possible for a visit .   Contact information: [email or web address] Call our physician referral line at [phone].        Fernando Jansky, MD. Schedule an appointment as soon as possible for a visit in 1 week(s).   Specialty: Internal Medicine Contact information: [address] [place] [address] [phone]                 Discharge Instructions     Diet - low sodium heart healthy   Complete by: As directed    Discharge instructions   Complete by: As directed    **IMPORTANT DISCHARGE INSTRUCTIONS**   From Dr. Jonel: You were admitted for pneumonia  Here, you were treated with antibiotics, Rocephin  and azithromycin  Typically, we give the cephalosporin (Rocephin ) for 7 days and the longer-acting azithromycin  for 5 days  Complete treatment with 3 more days cefdinir /Omnicef  300 mg once daily starting Friday morning Complete  treatment with one more day azithromycin  250 mg tomorrow   Increase activity slowly   Complete by: As directed        Discharge Exam: Filed Weights   10/15/24 0700 10/15/24 1100 10/16/24 1000  Weight: 78.7 kg 79.2 kg 78.7 kg    General: Pt is alert, awake, not in acute distress Cardiovascular: RRR, nl S1-S2, no murmurs appreciated.   No LE edema.   Respiratory: Normal respiratory rate and rhythm.  CTAB without rales or wheezes. Abdominal: Abdomen soft and non-tender.  No distension or HSM.   Neuro/Psych: Strength symmetric in upper and lower extremities.  Judgment and insight appear normal .   Condition at discharge: good  The results of significant diagnostics from this hospitalization (including imaging, microbiology, ancillary and laboratory) are listed below for reference.   Imaging Studies: ECHOCARDIOGRAM COMPLETE Result Date: 10/13/2024    ECHOCARDIOGRAM REPORT   Patient Name:   DACOTA RUBEN Date of Exam: 10/13/2024 Medical Rec #:  [id number]     Height:       69.0 in Accession #:    [id number]     Weight:       178.8 lb Date of Birth:  [birth year]    BSA:          1.970 m Patient Age:    67 years      BP:           119/64 mmHg Patient Gender: M             HR:           76 bpm. Exam Location:  Inpatient Procedure: 2D Echo (Both Spectral and Color Flow Doppler were utilized during            procedure). Indications:    CHF  History:        Patient has prior history of Echocardiogram examinations. CHF.  Sonographer:    Norleen Amour Referring Phys: [id number] MARSHA ADA IMPRESSIONS  1. 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 Left ventricular ejection fraction, by estimation, is 45 to 50%. The left ventricle has mildly decreased function. The left ventricle demonstrates global hypokinesis. Left ventricular diastolic parameters are consistent with Grade III diastolic dysfunction (restrictive). The average left ventricular global longitudinal strain is -8.7 %. The global longitudinal strain is abnormal.  2. Right ventricular systolic function is normal. The right ventricular size is mildly enlarged.  3. Left atrial size was moderately dilated.  4. There is no evidence of cardiac tamponade.  5. The mitral valve is normal in structure. Mild mitral valve regurgitation. No evidence of mitral stenosis.  6. The aortic valve is tricuspid. Aortic valve regurgitation is not visualized. Aortic valve sclerosis/calcification is present, without any evidence of aortic stenosis.  7. The inferior vena cava is normal in size with greater than 50% respiratory variability, suggesting right atrial pressure of 3 mmHg. 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 Left ventricular ejection fraction, by estimation, is 45 to 50%. The left ventricle has mildly decreased function. The  left ventricle demonstrates global hypokinesis. The average left ventricular  global longitudinal strain is -8.7 %. Strain was performed and the global longitudinal strain is abnormal. The left ventricular internal cavity size was normal in size. There is no left ventricular hypertrophy. Left ventricular diastolic parameters are consistent with Grade III diastolic dysfunction (restrictive). Right Ventricle: The right ventricular size  is mildly enlarged. No increase in right ventricular wall thickness. Right ventricular systolic function is normal. Left Atrium: Left atrial size was moderately dilated. Right Atrium: Right atrial size was normal in size. Pericardium: Trivial pericardial effusion is present. There is no evidence of cardiac tamponade. Mitral Valve: The mitral valve is normal in structure. Mild mitral valve regurgitation. No evidence of mitral valve stenosis. MV peak gradient, 4.1 mmHg. The mean mitral valve gradient is 2.0 mmHg. Tricuspid Valve: The tricuspid valve is normal in structure. Tricuspid valve regurgitation is not demonstrated. No evidence of tricuspid stenosis. Aortic Valve: The aortic valve is tricuspid. Aortic valve regurgitation is not visualized. Aortic valve sclerosis/calcification is present, without any evidence of aortic stenosis. Aortic valve mean gradient measures 4.0 mmHg. Aortic valve peak gradient measures 8.3 mmHg. Aortic valve area, by VTI measures 1.50 cm. Pulmonic Valve: The pulmonic valve was normal in structure. Pulmonic valve regurgitation is not visualized. No evidence of pulmonic stenosis. Aorta: The aortic root is normal in size and structure. Venous: The inferior vena cava is normal in size with greater than 50% respiratory variability, suggesting right atrial pressure of 3 mmHg. IAS/Shunts: No atrial level shunt detected by color flow Doppler.  LEFT VENTRICLE PLAX 2D LVIDd:         5.60 cm      Diastology LVIDs:         3.60 cm      LV e' medial:    4.79 cm/s LV PW:         0.90 cm      LV E/e' medial:  20.9 LV IVS:        0.90 cm      LV e'  lateral:   6.53 cm/s LVOT diam:     1.90 cm      LV E/e' lateral: 15.3 LV SV:         42 LV SV Index:   21           2D Longitudinal Strain LVOT Area:     2.84 cm     2D Strain GLS Avg:     -8.7 % LV IVRT:       58 msec  LV Volumes (MOD) LV vol d, MOD A2C: 185.0 ml LV vol d, MOD A4C: 178.0 ml LV vol s, MOD A2C: 94.6 ml LV vol s, MOD A4C: 87.2 ml LV SV MOD A2C:     90.4 ml LV SV MOD A4C:     178.0 ml LV SV MOD BP:      98.3 ml RIGHT VENTRICLE             IVC RV Basal diam:  4.10 cm     IVC diam: 1.30 cm RV S prime:     15.80 cm/s TAPSE (M-mode): 2.5 cm      PULMONARY VEINS                             Diastolic Velocity: 76.90 cm/s                             S/D Velocity:       0.40                             Systolic Velocity:  33.10 cm/s LEFT ATRIUM  Index        RIGHT ATRIUM           Index LA diam:        4.30 cm 2.18 cm/m   RA Area:     18.20 cm LA Vol (A2C):   76.3 ml 38.73 ml/m  RA Volume:   53.20 ml  27.01 ml/m LA Vol (A4C):   78.2 ml 39.70 ml/m LA Biplane Vol: 81.6 ml 41.42 ml/m  AORTIC VALVE                    PULMONIC VALVE AV Area (Vmax):    1.57 cm     PV Vmax:       0.84 m/s AV Area (Vmean):   1.53 cm     PV Peak grad:  2.8 mmHg AV Area (VTI):     1.50 cm AV Vmax:           144.00 cm/s AV Vmean:          99.400 cm/s AV VTI:            0.282 m AV Peak Grad:      8.3 mmHg AV Mean Grad:      4.0 mmHg LVOT Vmax:         79.80 cm/s LVOT Vmean:        53.700 cm/s LVOT VTI:          0.149 m LVOT/AV VTI ratio: 0.53  AORTA Ao Root diam: 2.90 cm Ao Asc diam:  3.00 cm MITRAL VALVE MV Area (PHT): 5.02 cm     SHUNTS MV Area VTI:   1.52 cm     Systemic VTI:  0.15 m MV Peak grad:  4.1 mmHg     Systemic Diam: 1.90 cm MV Mean grad:  2.0 mmHg MV Vmax:       1.01 m/s MV Vmean:      60.1 cm/s MV Decel Time: 151 msec MV E velocity: 100.00 cm/s MV A velocity: 40.90 cm/s MV E/A ratio:  2.44 Morene Brownie Electronically signed by Morene Brownie Signature Date/Time: 10/13/2024/3:42:39 PM    Final     US  Renal Transplant w/Doppler Result Date: 10/13/2024 CLINICAL DATA:  Acute renal insufficiency. EXAM: ULTRASOUND OF RENAL TRANSPLANT WITH RENAL DOPPLER ULTRASOUND TECHNIQUE: Ultrasound examination of the renal transplant was performed with gray-scale, color and duplex doppler evaluation. COMPARISON:  None Available. FINDINGS: Transplant kidney location: Right lower quadrant Transplant Kidney: Renal measurements: 11.7 x 7.8 x 6.2 cm = volume: . There is mild diffuse increased renal parenchymal echogenicity. There is moderate hydronephrosis of the transplant kidney with possible thickening of the urothelium of the collecting system versus debris. Correlation with urinalysis recommended to exclude pyelonephritis. No peritransplant fluid collection. There is a 2 cm upper pole cyst. Color flow in the main renal artery:  Yes Color flow in the main renal vein:  Yes Duplex Doppler Evaluation: Main Renal Artery Velocity: 77 cm/sec Main Renal Artery Resistive Index: 0.8 Venous waveform in main renal vein:  Present Intrarenal resistive index in upper pole:  0.65 (normal 0.6-0.8; equivocal 0.8-0.9; abnormal >= 0.9) Intrarenal resistive index in lower pole: 0.8 (normal 0.6-0.8; equivocal 0.8-0.9; abnormal >= 0.9) Bladder: Normal for degree of bladder distention. Other findings:  None. IMPRESSION: 1. Moderate hydronephrosis of the transplant kidney. Correlation with urinalysis recommended to evaluate for pyelonephritis. 2. Borderline elevated resistive indices in the main renal artery and lower pole of the transplant kidney. Electronically Signed   By:  Vanetta Chou M.D.   On: 10/13/2024 12:50   CT CHEST ABDOMEN PELVIS WO CONTRAST Result Date: 10/13/2024 EXAM: CT CHEST, ABDOMEN AND PELVIS WITHOUT CONTRAST 10/13/2024 07:28:08 AM TECHNIQUE: CT of the chest, abdomen and pelvis was performed without the administration of intravenous contrast. Multiplanar reformatted images are provided for review. Automated  exposure control, iterative reconstruction, and/or weight based adjustment of the mA/kV was utilized to reduce the radiation dose to as low as reasonably achievable. COMPARISON: Chest CT 07/17/2005. CT abdomen and pelvis 07/23/2021. CLINICAL HISTORY: 68 year old male with sepsis, status post kidney transplant 5 months ago. FINDINGS: CHEST: MEDIASTINUM AND LYMPH NODES: Mild cardiomegaly. Trace pericardial effusion. Calcified coronary artery atherosclerosis. Mild to moderate calcified thoracic aortic atherosclerosis. The central airways are clear. Major airways are patent with mild generalized airway thickening. No mediastinal, hilar or axillary lymphadenopathy. LUNGS AND PLEURA: Trace left pleural effusion is layering. Small but partially loculated right pleural effusion (series 3 image 38) with peripheral right upper and middle lobe lung opacity which more resembles infection than atelectasis or scarring. Bilateral middle lobe atelectasis. Mild left lower lobe atelectasis adjacent to pleural effusion. No pneumothorax. ABDOMEN AND PELVIS: LIVER: Embolization coils at the porta hepatis are new since 2022. 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 Perihepatic free fluid is small to moderate in volume, has simple fluid density favoring transudate / ascites. GALLBLADDER AND BILE DUCTS: The gallbladder is chronically abnormal, partially contracted and with adjacent fat stranding which does not appear significantly changed from 2022. No biliary ductal dilatation. SPLEEN: Small volume perisplenic free fluid. PANCREAS: No acute abnormality. ADRENAL GLANDS: Negative adrenal glands. KIDNEYS, URETERS AND BLADDER: Previous bilateral nephrectomy. Abnormal right lower quadrant renal transplant, nonspecific non-contrast CT appearance which might reflect rejection or pararenal hematoma. There is an adjacent percutaneous pigtail drain along the lateral aspect of the transplant, as well  as adjacent nonspecific free fluid and a fluid collection along the posterior and inferior margin of the transplant (series 3 image 107, unclear whether this is hydronephrosis). Moderate transplant perinephric stranding, also nonspecific. No transplant hydroureter. Fat stranding from adjacent to the right renal transplant also extends into the space of Retzius. Chronically atrophied left lower quadrant transplant since 2022. Stable appearance of the left renal transplant ureter. The urinary bladder is mildly distended, with a similar volume to the previous CT abdomen and pelvis. No stones in the kidneys or ureters. No hydronephrosis. No perinephric or periureteral stranding. GI AND BOWEL: Nondilated stomach. Chronic left lower quadrant large bowel anastomosis appears stable with no adverse features. Upstream diverticulosis of the descending colon with no active inflammation. Large bowel retained stool elsewhere. Normal gas containing appendix tracking to the midline on series 3 image 93. No dilated small bowel. There is no bowel obstruction. REPRODUCTIVE ORGANS: Prostatomegaly. Partially visible severe scrotal swelling, which appears to be related to right greater than left scrotal hydroceles with simple fluid density (series 4 image 16). PERITONEUM AND RETROPERITONEUM: Small to moderate volume nonspecific ascites in the abdomen and pelvis. No free air. VASCULATURE: Aorta is normal in caliber. ABDOMINAL AND PELVIS LYMPH NODES: No lymphadenopathy. BONES AND SOFT TISSUES: Mild L4 lumbar spine degeneration except for advanced chronic disc and endplate degeneration at the lumbosacral junction. Underlying right ribs appear intact. No acute or suspicious osseous lesion identified in the chest. Postoperative changes to the ventral abdominal wall with no adverse features. Superimposed chronic fat containing right inguinal hernia. No herniated bowel. No focal soft tissue abnormality. IMPRESSION:  1. Right lower quadrant renal  transplant with Abnormal but nonspecific non-contrast CT appearance. Considerations include rejection, pyelonephritis or even some renal hematoma. And there is an adjacent percutaneous drain, correlate with output. 2. Small to moderate volume nonspecific ascites in the abdomen and pelvis, appears to be simple fluid density. 3. Small but partially loculated right pleural effusion with peripheral right upper and middle lobe lung opacity that more resembles infection than atelectasis or scarring. 4. Small pericardial effusion. 5. Other chronic findings, including bilateral native nephrectomy, atrophied left lower quadrant renal transplant . Electronically signed by: Helayne Hurst MD 10/13/2024 07:46 AM EST RP Workstation: [id number]   DG Chest Portable 1 View Result Date: 10/13/2024 EXAM: 1 VIEW(S) XRAY OF THE CHEST 10/13/2024 02:12:00 AM COMPARISON: 04/05/2022 CLINICAL HISTORY: Fevers FINDINGS: LUNGS AND PLEURA: Linear scarring is again seen in the left mid lung with associated suture line. No pleural effusion. No pneumothorax. HEART AND MEDIASTINUM: Cardiomegaly. Calcified aorta. BONES AND SOFT TISSUES: No acute osseous abnormality. IMPRESSION: 1. No acute cardiopulmonary process. Electronically signed by: Oneil Devonshire MD 10/13/2024 02:32 AM EST RP Workstation: [id number]    Microbiology: Results for orders placed or performed during the hospital encounter of 10/13/24  Blood culture (routine x 2)     Status: None (Preliminary result)   Collection Time: 10/13/24  1:29 AM   Specimen: BLOOD  Result Value Ref Range Status   Specimen Description BLOOD SITE NOT SPECIFIED  Final   Special Requests   Final    BOTTLES DRAWN AEROBIC AND ANAEROBIC Blood Culture adequate volume   Culture   Final    NO GROWTH 3 DAYS Performed at [facility], [address] [place], [place] [address]    Report Status PENDING  Incomplete  Resp panel by RT-PCR (RSV, Flu A&B, Covid) Anterior Nasal Swab     Status: None    Collection Time: 10/13/24  1:31 AM   Specimen: Anterior Nasal Swab  Result Value Ref Range Status   SARS Coronavirus 2 by RT PCR NEGATIVE NEGATIVE Final   Influenza A by PCR NEGATIVE NEGATIVE Final   Influenza B by PCR NEGATIVE NEGATIVE Final    Comment: (NOTE) The Xpert Xpress SARS-CoV-2/FLU/RSV plus assay is intended as an aid in the diagnosis of influenza from Nasopharyngeal swab specimens and should not be used as a sole basis for treatment. Nasal washings and aspirates are unacceptable for Xpert Xpress SARS-CoV-2/FLU/RSV testing.  Fact Sheet for Patients: bloggercourse.com  Fact Sheet for Healthcare Providers: seriousbroker.it  This test is not yet approved or cleared by the [place]  FDA and has been authorized for detection and/or diagnosis of SARS-CoV-2 by FDA under an Emergency Use Authorization (EUA). This EUA will remain in effect (meaning this test can be used) for the duration of the COVID-19 declaration under Section 564(b)(1) of the Act, 21 U.S.C. section 360bbb-3(b)(1), unless the authorization is terminated or revoked.     Resp Syncytial Virus by PCR NEGATIVE NEGATIVE Final    Comment: (NOTE) Fact Sheet for Patients: bloggercourse.com  Fact Sheet for Healthcare Providers: seriousbroker.it  This test is not yet approved or cleared by the [place]  FDA and has been authorized for detection and/or diagnosis of SARS-CoV-2 by FDA under an Emergency Use Authorization (EUA). This EUA will remain in effect (meaning this test can be used) for the duration of the COVID-19 declaration under Section 564(b)(1) of the Act, 21 U.S.C. section 360bbb-3(b)(1), unless the authorization is terminated or revoked.  Performed at [facility],  [address], [place], [place] [address]   Blood culture (routine x 2)     Status: None (Preliminary result)   Collection  Time: 10/13/24  8:06 PM   Specimen: BLOOD  Result Value Ref Range Status   Specimen Description BLOOD SITE NOT SPECIFIED  Final   Special Requests   Final    BOTTLES DRAWN AEROBIC AND ANAEROBIC Blood Culture adequate volume   Culture   Final    NO GROWTH 3 DAYS Performed at [facility], [address] [place], [place] [address]    Report Status PENDING  Incomplete  Respiratory (~20 pathogens) panel by PCR     Status: None   Collection Time: 10/14/24  6:23 AM   Specimen: Nasopharyngeal Swab; Respiratory  Result Value Ref Range Status   Adenovirus NOT DETECTED NOT DETECTED Final   Coronavirus 229E NOT DETECTED NOT DETECTED Final    Comment: (NOTE) The Coronavirus on the Respiratory Panel, DOES NOT test for the novel  Coronavirus (2019 nCoV)    Coronavirus HKU1 NOT DETECTED NOT DETECTED Final   Coronavirus NL63 NOT DETECTED NOT DETECTED Final   Coronavirus OC43 NOT DETECTED NOT DETECTED Final   Metapneumovirus NOT DETECTED NOT DETECTED Final   Rhinovirus / Enterovirus NOT DETECTED NOT DETECTED Final   Influenza A NOT DETECTED NOT DETECTED Final   Influenza B NOT DETECTED NOT DETECTED Final   Parainfluenza Virus 1 NOT DETECTED NOT DETECTED Final   Parainfluenza Virus 2 NOT DETECTED NOT DETECTED Final   Parainfluenza Virus 3 NOT DETECTED NOT DETECTED Final   Parainfluenza Virus 4 NOT DETECTED NOT DETECTED Final   Respiratory Syncytial Virus NOT DETECTED NOT DETECTED Final   Bordetella pertussis NOT DETECTED NOT DETECTED Final   Bordetella Parapertussis NOT DETECTED NOT DETECTED Final   Chlamydophila pneumoniae NOT DETECTED NOT DETECTED Final   Mycoplasma pneumoniae NOT DETECTED NOT DETECTED Final    Comment: Performed at [facility], [address], [place], [place] [address]  C Difficile Quick Screen w PCR reflex     Status: None   Collection Time: 10/15/24  9:39 AM   Specimen: STOOL  Result Value Ref Range Status   C Diff antigen NEGATIVE NEGATIVE Final   C Diff  toxin NEGATIVE NEGATIVE Final   C Diff interpretation No C. difficile detected.  Final    Comment: Performed at [facility], [address], [place], [place] [address]    Labs: CBC: Recent Labs  Lab 10/13/24 0112 10/13/24 1030 10/14/24 0946 10/15/24 0308 10/16/24 0343  WBC 10.2 8.0  --  5.8 6.9  NEUTROABS 9.3*  --   --   --   --   HGB 9.1* 8.4* 8.4* 8.2* 8.7*  HCT 29.2* 27.2*  --  25.8* 28.2*  MCV 84.6 86.6  --  84.3 85.5  PLT 181 169  --  186 233   Basic Metabolic Panel: Recent Labs  Lab 10/13/24 0112 10/13/24 1030 10/14/24 0342 10/15/24 0308 10/16/24 0343  NA 139  --  140 140 139  K 3.5  --  3.6 3.1* 3.4*  CL 101  --  102 102 104  CO2 24  --  25 26 23   GLUCOSE 127*  --  176* 218* 164*  BUN 33*  --  34* 31* 30*  CREATININE 3.47* 3.30* 3.26* 3.23* 2.69*  CALCIUM  8.9  --  8.6* 8.8* 8.9  MG  --   --   --  1.6* 2.1  PHOS  --   --  3.6 2.9 2.5   Liver Function Tests: Recent Labs  Lab 10/13/24 0112 10/14/24 0342 10/15/24 0308 10/16/24 0343  AST 14*  --  10*  --   ALT 10  --  8  --   ALKPHOS 73  --  67  --   BILITOT 0.8  --  0.6  --   PROT 6.2*  --  5.7*  --   ALBUMIN 3.1* 2.7* 2.8* 2.9*   CBG: Recent Labs  Lab 10/15/24 1158 10/15/24 1639 10/15/24 2008 10/16/24 0836 10/16/24 1116  GLUCAP 183* 268* 300* 232* 237*    Discharge time spent: approximately 45 minutes spent on discharge counseling, evaluation of patient on day of discharge, and coordination of discharge planning with nursing, social work, pharmacy and case management  Signed: Lonni SHAUNNA Dalton, MD [facility] 10/16/2024

## 2024-10-16 NOTE — TOC Transition Note (Signed)
 Transition of Care [facility]) - Discharge Note   Patient Details  Name: Nicholas Glenn MRN: [id number] Date of Birth: [birth year]  Transition of Care [facility]) CM/SW Contact:  Tom-Johnson, Harvest Muskrat, RN Phone Number: 10/16/2024, 10:48 AM   Clinical Narrative:     Patient is scheduled for discharge today.  Readmission Risk Assessment done. Outpatient f/u, hospital f/u and discharge instructions on AVS. No ICM needs or recommendations noted. Wife, Jenkins at bedside and will transport at discharge.  No further ICM needs noted.       Final next level of care: Home/Self Care Barriers to Discharge: Barriers Resolved   Patient Goals and CMS Choice Patient states their goals for this hospitalization and ongoing recovery are:: To return home CMS Medicare.gov Compare Post Acute Care list provided to:: Patient Choice offered to / list presented to : NA      Discharge Placement                Patient to be transferred to facility by: Wife Name of family member notified: Ann    Discharge Plan and Services Additional resources added to the After Visit Summary for                  DME Arranged: N/A DME Agency: NA         HH Agency: NA        Social Drivers of Health (SDOH) Interventions SDOH Screenings   Food Insecurity: No Food Insecurity (10/14/2024)  Housing: Low Risk  (10/14/2024)  Transportation Needs: No Transportation Needs (10/14/2024)  Utilities: Not At Risk (10/14/2024)  Depression (PHQ2-9): Low Risk  (01/13/2019)  Financial Resource Strain: Low Risk  (10/06/2024)   Received from [facility]  Physical Activity: Insufficiently Active (06/04/2020)   Received from [facility]  Social Connections: Socially Integrated (10/14/2024)  Stress: No Stress Concern Present (06/04/2020)   Received from [facility]  Tobacco Use: Low Risk  (10/13/2024)     Readmission Risk Interventions    10/14/2024    3:54 PM   Readmission Risk Prevention Plan  Transportation Screening Complete  PCP or Specialist Appt within 3-5 Days Complete  HRI or Home Care Consult Complete  Social Work Consult for Recovery Care Planning/Counseling Complete  Palliative Care Screening Not Applicable  Medication Review Oceanographer) Referral to Pharmacy

## 2024-10-18 LAB — CULTURE, BLOOD (ROUTINE X 2)
Culture: NO GROWTH
Culture: NO GROWTH
Special Requests: ADEQUATE
Special Requests: ADEQUATE

## 2024-10-18 LAB — TACROLIMUS LEVEL: Tacrolimus (FK506) - LabCorp: 6.1 ng/mL (ref 5.0–20.0)

## 2024-10-23 ENCOUNTER — Encounter (HOSPITAL_COMMUNITY): Payer: Self-pay

## 2024-10-23 ENCOUNTER — Telehealth (HOSPITAL_COMMUNITY): Payer: Self-pay

## 2024-10-23 NOTE — Telephone Encounter (Signed)
Attempted to call patient in regards to Cardiac Rehab - LM on VM   Sent letter 

## 2024-10-23 NOTE — Telephone Encounter (Signed)
 Outside/paper referral received by Dr. Merline from [facility]. Will fax over Physician order and request further documents. Insurance benefits and eligibility to be determined.

## 2024-11-03 ENCOUNTER — Telehealth (HOSPITAL_COMMUNITY): Payer: Self-pay

## 2024-11-03 NOTE — Telephone Encounter (Signed)
 Called patient to see if he is interested in the Cardiac Rehab Program. Patient expressed interest. Explained scheduling process and went over insurance, patient verbalized understanding. Will contact patient for scheduling once f/u has been completed on 12/18 and will he has been cleared. Will pass pt top nurse navigator for review.

## 2024-11-11 NOTE — Progress Notes (Signed)
 " Hospital Medicine Attending Progress Note  Hospital Day: 7 with principal problem of Lymphadenopathy, cervical.  Subjective:   - Still feeling very fatigued but feeling less foggy this morning. 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  - Discussed with transplant nephrology, plan to decrease tacro with goal 3-5 in the setting of PTLD - Discussed with oncology, they will see patient tomorrow  - Patient updated with preliminary path reports, lots of questions which were answered to the best of my ability, but many deferred to oncology tomorrow.   Objective:    Current Vital Signs 24h Vital Sign Ranges  T 36.8 C (98.2 F) (11/11/24 1605) Temp  Avg: 37.2 C (99 F)  Min: 36.7 C (98.1 F)  Max: 38.3 C (101 F)  BP 117/60 (11/11/24 1605) BP  Min: 99/59  Max: 127/68  HR 80 (11/11/24 1605) Pulse  Avg: 83.8  Min: 71  Max: 92  RR 18 (11/11/24 1605) Resp  Avg: 16.2  Min: 11  Max: 18  O2sat 97 % CPAP (11/08/24 0157) SpO2  Avg: 97.4 %  Min: 96 %  Max: 100 %       Current Shift Ins/Outs 24h Total Ins/Outs  Time Ins Outs 12/23 0701 - 12/23 1900 In: 288.4 [P.O.:240] Out: 700 [Urine:550; Drains:150] 12/22 0701 - 12/23 0700 In: 750 [P.O.:240; I.V.:10] Out: 2500 [Urine:2050; Drains:450]       Weights   First 80.9 kg (178 lb 5.6 oz) (11/05/24 0749)   Last 72.9 kg (160 lb 11.5 oz) (11/11/24 9277)     Physical Exam  Gen: appears stated age, NAD HEENT: normocephalic, anicteric sclera, clear OP  CV: RRR, no m/r/g, no edema Pulm: non-labored respirations, CTAB, no wheezing ABD: +BS, soft, non-tender, non-distended, RLQ allograft without overlying tenderness  EXT: warm, well perfused, moving lower and upper ext, LUE AVF  Scheduled Medications   apixaban , 5 mg, Oral, Q12H  SCH carvedilol , 37.5 mg, Oral, Q12H SCH cyanocobalamin, 1,000 mcg, Oral, Daily insulin  GLARGINE-yfgn (SEMGLEE ) injection, 15 Units, Subcutaneous, QHS insulin  LISPRO (AdemLOG, HumaLOG) injection, 0-7 Units, Subcutaneous, TID CC insulin  LISPRO (AdmeLOG, HumaLOG) injection, 0-12 Units, Subcutaneous, 4x Daily letermovir , 480 mg, Oral, Daily pantoprazole , 40 mg, Oral, Daily predniSONE , 5 mg, Oral, Daily rosuvastatin , 10 mg, Oral, QHS sulfamethoxazole -trimethoprim , 1 tablet, Oral, Daily [START ON 11/12/2024] tacrolimus , 1 mg, Oral, Daily [Held by provider] TORsemide , 60 mg, Oral, Daily     Infusions        PRN Meds   acetaminophen , 650 mg, Oral, Q6H PRN dextrose 50% in water, 12.5-25 g, Intravenous, As Directed glucagon, 1 mg, Intramuscular, As Directed lidocaine , 0.5 mL, Subcutaneous, As Directed melatonin, 3 mg, Oral, QHS PRN polyethylene glycol, 17 g, Oral, Daily PRN sodium chloride , 1 spray, Both Nares, Q2H PRN       Recent Lab Trends  Recent Labs  Lab 11/09/24 0516 11/10/24 0400 11/11/24 0508  NA 134* 133* 132*  K 3.4* 3.1* 3.5  CL 98 96* 96*  CO2 26 26 26   BUN 38* 33* 35*  CREATININE 2.5* 2.3* 2.4*  GLUCOSE 173* 137 145*  CALCIUM  8.5* 8.3* 8.6*   Recent Labs  Lab 11/08/24 0917 11/09/24 0516 11/10/24 0400  AST 17 19 21   ALT 11* 14* 14*  ALKPHOS 88 91 81  TBILI 0.5 0.7 0.9    Recent  Labs  Lab 11/09/24 0516 11/10/24 0400 11/11/24 0508  WBC 3.9 4.1 4.4  HGB 9.8* 9.6* 9.1*  HCT 32.1* 30.2* 28.8*  PLT 241 207 163   Recent Labs  Lab 11/05/24 0941 11/07/24 0324  APTT 32.4 27.4  INR 1.7* 1.1       Interval results/studies personally reviewed:   Images:   ECG/Echo/Telemetry:       Assessment/Plan   Principal Problem:   Lymphadenopathy, cervical Active Problems:   Status post living-donor kidney transplantation (HHS-HCC)   Type 2 diabetes mellitus without complication, with long-term current use of insulin  (HCC)   Chronic systolic CHF  (congestive heart failure) (CMS/HHS-HCC)   Acute pharyngitis due to other specified organisms Resolved Problems:   * No resolved hospital problems. *   [name] with ho renal transplant August 2025 (his second; first was 2005) at [facility]; rocky course after c/b AKI, CHF LVEF 30%, multiple admits for PNA and vol overload. Now here with sore throat, fevers, diffuse adenopathy, and rising EBV consistent with PTLD.    #Pharyngitis with cervical adenopathy #Rising EBV titers #Fevers - Differential includes PTLD, other heme malignancy such as CLL, reactive (less likely), metastatic sarcoma give his history (less likely). Will need tissue diagnosis  - Transplant ID, Renal consulted - ENT recommends against excisional biopsy due to anesthesia risk - discussed with neuro rads -- s/p LN biopsy on 12/19 with pending results  - resuming eliquis  s/p LN biopsy  - infectious fungal work-up per transplant ID: urine histo/blasto antigen, serum galactomanna, and cryptococcal ag - DC abx per transplant ID  - resume home letermovir   - prelim path with c/f PTLD, consult oncology   #S/p renal transplant Aug 2025 (first was in 2005) #CKD of allograft baseline Cr around 2.6-2.8, stable Per chart, [facility] ID had recommended holding anti-metabolite given concern for pharngitis/infection. So will continue with only ensarsus and steroids and hold his MMF - transplant renal following - decrease tacro, goal 3-5   Chronic systolic heart failure LVEF 30% Given fall, dizziness, and net negative, likely hypovolemic. Giving small amount of fluids and hold torsemide .  - Hold torsemide  60 daily for now - hold jardiance  - continue coreg  37.5 BID   DM2 - on lantus  15 (takes every morning, last dose was Monday AM) and lispro 1 unit per 7 grams of carbs. Expect he will run high with dex. Started on lantus  15 QHS (first dose now) plus lispro for carb counting 1:8 (just to be a bit conservative) and a correctional  scale and we will adjust. No glucose in ED since 0930 so need to get one now and see where he is.  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, sarcoma recurrence - will need tissue as above          VTE Prophylaxis  + Anticoagulant Ordered  apixaban , 5 mg, Oral, Q12H SCH, 5 mg at 11/08/24 9195          Code Status: Full Code   Discharge Planning:    Current Ambulatory Status: walks Anticipated Discharge Destination: Home Anticipated Discharge Needs: none PT DC recommendation:   Estimated Discharge Date: 11/12/2024  RUEF, JENNIFER    JULIA EARNIE EMERALD, MD  [facility]  I spent a total of 50 minutes in both face-to-face and non-face-to-face activities, excluding procedures performed, for this visit on the date  of this encounter.        "

## 2024-12-11 NOTE — Progress Notes (Signed)
 " Transplant Infectious Diseases Inpatient Progress Note    Service/Attending Requesting Consult: Intensive Care, Mariel Garnette Dover,* Reason for consult: Letermovir  continuation, pharyngitis  Transplant Details: Solid Organ: 08/14/2005 (Kidney)  HISTORY   HPI: 69 y.o. 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 He is admitted with progressive odynophagia and confusion. ID consulted for letermovir  continuation and antibiotic recommendations for pharyngitis/supraglottitis.   Current admission:  12/01/2024: Difficulty with swallowing pills and progressive odynophagia, muffled voice, confusion and ongoing aspiration at home. Admitted to Oncology then transferred to the MICU for airway monitoring. Febrile to 38.0, HDS, 98% on room air. ENT scope on 1/12 with gray exudate. Started on vancomycin , piperacillin-tazobactam, and azithromycin . CT of the neck with worsening pharyngitis with probable supraglottitis. No abscess or drainable fluid collection. Interval enlargement of multistation cervical and thoracic lymphadenopathy including lymphoid tonsillar tissue when compared to 11/05/24. Admission blood culture 1 of 2 with Granulicatella adiacens.  12/04/23 Failed speech and swallow. Airway improved after 24 hours of dexamethasone . EBV PCR increased to 49,300 (up from 35,900 on 1/6). C1D1 R-GDP.  12/09/23 Fever to 39.1, intubation. Bronch with BAL culture growing 1+ E. Cloacae (Cefepime -SDD, Mero-S, Erta-R)  12/09/24 Hemoptysis 12/10/24 Bedside bronchoscopy with clot in the BI   Interval events: (12/11/2024)  Persistently elevated temp > 38.0  HDS, on PS 40%  10/12 Clot seen on bedside bronchoscopy pending removal with IP, though dependent upon improvement in thrombocytopenia with platelet transfusions  Serum cr rising to 3.7  WBC 0.6 ANC 510, Hgb 6.5, Plt 40 (28)  1/19 BAL E. Cloacae AST returns, start meropenem   Antimicrobials: Vancomycin  1/12 - p  Meropenem 1/22 - p   Acyclovir  Letermovir    Azithromycin  1/13  Piperacillin-tazobactam 1/12 - 1/13  Ceftriaxone  1/13 - 1/16 Cefepime  1/19 - 1/22  Current Facility-Administered Medications  Medication Dose Route Frequency Provider Last Rate Last Admin   acetaminophen  (TYLENOL ) 160 mg/5 mL oral LIQUID 975 mg  975 mg Via Tube Q6H PRN Iva Rueben Jansky, MD   975 mg at 12/11/24 1319   acyclovir (ZOVIRAX) 200 mg/5 mL suspension 400 mg  400 mg Via Tube Q12H [facility] Hayden Consepcion Jansky, MD   400 mg at 12/11/24 9171   allopurinol (ZYLOPRIM) 20 mg/mL oral suspension 100 mg  100 mg Via Tube Daily Jama Pagan, MD   100 mg at 12/11/24 9171   [Held by provider] apixaban  (ELIQUIS ) tablet 5 mg  5 mg Oral Q12H [facility] Cleotilde Bayard Reusing, MD   5 mg at 12/01/24 2023   banana flakes-GOS-fiber (BANATROL TF) 5 gram-45 kcal/60 mL 1 packet  1 packet Via Tube Q8H Iva Rueben Jansky, MD   1 packet at 12/11/24 1142   dextrose 10 % infusion  50 mL/hr Intravenous As Directed Bhatt, Fredia Decent, MD       dextrose 50% in water solution 12.5-25 g  12.5-25 g Intravenous As Directed Cleotilde Bayard Reusing, MD       famotidine (PEPCID) 40 mg/5 mL (8 mg/mL) suspension 20 mg  20 mg Via Tube Daily Hayden Consepcion Jansky, MD   20 mg at 12/11/24 9171   fentaNYL (PF) (SUBLIMAZE) injection 25 mcg  25 mcg Intravenous Q15 Min PRN Mariel Garnette  Belvie, MD   25 mcg at 12/11/24 1608   And   fentaNYL citrate (SUBLIMAZE) 1,250 mcg/25 mL infusion  12.5-150 mcg/hr Intravenous Continuous PRN Mariel Garnette Belvie, MD       And   fentaNYL (SUBLIMAZE) 50 mcg/mL bolus from infusion 12.5-150 mcg  12.5-150 mcg Intravenous  Q30 Min PRN Mariel Garnette Belvie, MD       fentaNYL (SUBLIMAZE) 50 mcg/mL bolus from infusion 100 mcg  100 mcg Intravenous Once Chukrun, Tamar, MD       filgrastim-ayow (RELEUKO) inj syringe 300 mcg  300 mcg Subcutaneous QPM Cass Josette Kubas, MD   300 mcg at 12/10/24 1555   glucagon (GLUCAGEN) injection 1 mg  1 mg Subcutaneous As Directed Miller, Kaia Christine, MD       [facility] by provider] heparin  (porcine) 5,000 unit/mL injection 5,000 Units  5,000 Units Subcutaneous Q12H [facility] Bhatt, Anjali Dipak, MD   5,000 Units at 12/09/24 2100   hydrALAZINE (APRESOLINE) injection 5 mg  5 mg Intravenous Q6H PRN Cristela Aleen Perkins, MD       insulin  GLARGINE-yfgn (SEMGLEE ) injection 5 Units  5 Units Subcutaneous QHS Gallagher, Kiara Marie, MD   5 Units at 12/10/24 2100   [Held by provider] insulin  REGULAR (HumuLIN R ) injection 3 Units  3 Units Subcutaneous Q6H [facility] Jama Pagan, MD       insulin  REGULAR (HumuLIN R ) injection correction dose 0-12 Units  0-12 Units Subcutaneous Q6H [facility] Mariel Garnette Belvie, MD   3 Units at 12/11/24 0602   letermovir  (PREVYMIS ) packet 480 mg  480 mg NG Tube Daily Jennine Fredia Decent, MD   480 mg at 12/11/24 0829   lime water-olive oil topical emulsion   Topical TID PRN Hendren, Bryce K, MD   Given at 12/06/24 2000   loperamide  (IMODIUM ) 1 mg/7.5 mL solution 2 mg  2 mg Via Tube BID Jama Pagan, MD   2 mg at 12/11/24 1612   loperamide  (IMODIUM ) 1 mg/7.5 mL solution 2 mg  2 mg Via Tube QID PRN Jama Pagan, MD       meropenem (MERREM) 500 mg in sodium chloride  0.9 % 100 mL IVPB  500 mg Intravenous Q12H Iva Rueben Jansky, MD   Stopped at 12/11/24 1655   PAIN-AGITATION-DELIRIUM PROTOCOL 1 each  1 each XX PAD ASSESSMENT Naomi Serge, MD       predniSONE  (DELTASONE ) tablet 5 mg  5 mg Via Tube Daily Iva Rueben Jansky, MD   5 mg at 12/11/24 9171   propofol infusion (DIPRIVAN) 10 mg/mL infusion  5-60 mcg/kg/min (Dosing Weight) Intravenous Continuous PRN Naomi Serge, MD 15.1 mL/hr at 12/11/24 1700 35 mcg/kg/min at 12/11/24 1700   propofol infusion (DIPRIVAN) 10 mg/mL infusion  5-60 mcg/kg/min (Dosing Weight) Intravenous Continuous Chukrun, Tamar, MD       rocuronium (ZEMURON) injection 80 mg  80 mg Intravenous Once Chukrun, Tamar, MD       [Held by provider] rosuvastatin  (CRESTOR ) tablet 10 mg  10 mg Oral QHS Miller, Kaia Christine, MD       sirolimus (RAPAMUNE) 1 mg/mL solution 2 mg  2 mg Via Tube Daily Jennine Fredia Decent, MD   2 mg at 12/11/24 9171   sodium chloride  0.9% infusion   Intravenous Continuous Iva Rueben Jansky, MD 10 mL/hr at 12/11/24 1700 Rate Verify at 12/11/24 1700   [START ON 12/12/2024] sulfamethoxazole -trimethoprim  (SEPTRA ) 200-40 mg/5 mL suspension 80 mg of trimethoprim   10 mL Via Tube QMWF Chukrun, Tamar, MD  tranexamic acid (CYKLOKAPRON) nebulizer solution 500 mg  500 mg Nebulization TID RT Mariel Garnette Dover, MD   500 mg at 12/11/24 1530   [Held by provider] traZODone (DESYREL) tablet 50 mg  50 mg Via Tube QHS Hayden Consepcion Jansky, MD   50 mg at 12/07/24 2022   [Held by provider] tube feeding (NOVASOURCE RENAL) liquid   Via Tube Continuous Cristela Aleen Perkins, MD   Stopped at 12/11/24 1000   VANCOMYCIN  DOSING PER LEVELS   XX As Directed Admin Bergin, Garnette Dover, MD       vancomycin  pharmacy consult   XX consult Jennine Fredia Decent, MD        Immunization History  Administered Date(s) Administered   COVID-19 Pfizer Monovalent Vaccine 12/25/2019, 01/19/2020, 07/11/2020, 04/15/2021   Covid-19 Vaccine 52mcg/0.2ml (>=75yrs) mNexspike Moderna 09/18/2024   Covid-19 Vaccine 65mcg/0.3ml (>=60yrs) Comirnaty Pfizer-Biontech 08/28/2023   Flu Vaccine HD-IIV3 High Dose, IM PF(67yo+)(Fluzone) 08/24/2016, 09/06/2020, 08/21/2023, 08/21/2023, 09/18/2024   Flu Vaccine IIV3 adjuv,IM PF(65+)(Fluad) 08/22/2018   Flu Vaccine IIV3, IM PF (52mo+)(Fluarix, FluLaval, Fluzone) 09/06/2009, 09/26/2010, 10/09/2011,  08/26/2012, 08/25/2013, 09/30/2014, 08/22/2018   Flu Vaccine IIV3, IM with Pres (68MO+)(Afluria, Fluzone) 09/29/2013   HEP B (>=59YR) VACCINE (HEPLISAV-B) 03/21/2021, 03/22/2021, 04/21/2021, 06/02/2021, 11/28/2021, 03/27/2023, 03/27/2023, 04/26/2023, 05/28/2023, 09/06/2023   Influenza IIV4, High Dose IM (65 Yr+) (FLUZONE QUAD) 07/31/2019, 09/06/2021   Influenza IIV4, IM PF (65 Yr+) (FLUAD QUAD) 09/14/2022   Influenza IIV4, IM pres-free 08/09/2015, 08/23/2017, 09/03/2020   Influenza TIV (IM) 09/29/2013   Influenza, H1N1-09 09/23/2008   Influenza, H1N1-09, unspecified 09/23/2008   Influenza, IM unspecified 09/20/1998, 10/23/2001, 10/27/2002, 09/06/2004, 08/21/2007, 07/22/2008, 09/06/2009, 09/26/2010, 10/09/2011, 08/26/2012, 08/25/2013, 10/06/2014, 07/22/2015, 08/23/2017, 08/22/2018, 09/03/2020, 07/21/2021   PNEUMOCOCCAL (PCV13) (BIRTH-73YR) VACCINE (PREVNAR 13) 09/29/2013, 10/11/2023   PNEUMOCOCCAL (PPSV23)(>=28YRS -OR- >=2 YRS WITH RISK) VACCINE (PNEUMOVAX 23) 10/23/2001, 05/20/2005, 08/24/2016   Pneumococcal (PCV20) (>=6WKS) vaccine (Prevnar 20) (aka PCV 20) 10/11/2023   Pneumococcal Conjugate (7-Valent) 03/03/2013   RZV(>=40YR -OR-19+YRS IF  IMMCOMP) VACCINE (SHINGRIX) 11/01/2023, 01/09/2024   TDAP (>=55YR) VACCINE (ADACEL/BOOSTRIX) 09/29/2013, 09/27/2023   Td (Adult), unspecified 03/20/1997, 09/29/2013   Tetanus Toxoid, unspecified 11/20/1996   PHYSICAL EXAMINATION     Current Vital Signs 24h Vital Sign Ranges  Temp 37.8 C (100 F) (12/11/24 1700) Temp  Avg: 38.2 C (100.7 F)  Min: 36.8 C (98.2 F)  Max: 38.7 C (101.7 F)  BP 123/49 (12/08/24 2215) No data recorded  HR 97 (12/11/24 1700) Pulse  Avg: 107.4  Min: 97  Max: 117  RR 26 (12/11/24 1700) Resp  Avg: 30.5  Min: 20  Max: 39  O2sat 97 % Ventilator (12/11/24 1700) SpO2  Avg: 95.7 %  Min: 85 %  Max: 99 %  ABW Wt Readings from Last 1 Encounters:  12/10/24 79.6 kg (175 lb 7.8 oz)   IDW Ideal body weight: 70.3 kg  (154 lb 15.2 oz) Adjusted ideal body weight: 74 kg (163 lb 2.6 oz)     General: Intubated, sedated  HEENT: Diffuse oropharyngeal edema, minimal erythema, ET tube with bloody secretions  Neck: Bilateral cervical lymphadenopathy  Lungs: Mechanical breath sounds throughout  Abd: Soft, non-distended Ext: Warm, no edema  Lines & Tubes:  Patient Lines/Drains/Airways Status     Active PICC Line / CVC Line / PIV Line / Drain / Intraosseous Line / ART Line / Line / NG/OG Tube     Name Placement date Placement time Site Days   PICC Double Lumen Non-Tunneled/Temporary 12/03/24 Right Basilic 12/03/24  2216  Basilic  8   Peripheral IV 12/01/24 Right Antecubital 12/01/24  1200  Antecubital  10   Peripheral IV 12/02/24 Right;Anterior;Mid Cephalic;Upper arm 12/02/24  0425  Cephalic;Upper arm  9   Urethral Catheter Temperature probe 12/08/24  1200  Temperature probe  3   Nasojejunal/ Nasoduodenal Tube NasoJejunal CorTrak (magnetized) EnFit Right Nare 12/03/24  1800  Nare  8   External Fecal Collector 12/11/24  1200  -- less than 1   Arterial Line 12/08/24 Right Radial 12/08/24  2130  Radial  3            INVESTIGATIONS  All tests documented below have been reviewed at time of initial consult.  Recent Labs  Lab 12/09/24 0455 12/10/24 0147 12/10/24 1121 12/11/24 0355 12/11/24 1217  WBC 0.7* 1.6* 0.7* 0.6* 0.7*  HGB 7.4* 7.3* 7.0* 6.5* 7.2*  PLT 50* 34* 28* 40* 19*       Recent Labs  Lab 12/09/24 0455 12/10/24 0147 12/11/24 0355  AST  --  23  --   ALT  --  17  --   ALKPHOS  --  86  --   TBILI  --  0.5  --   ALB 2.1* 1.9*  1.9* 1.8*     Recent Labs  Lab 12/08/24 1959 12/09/24 0455 12/10/24 0147 12/11/24 0355 12/11/24 1132  CREATININE 2.8* 3.1* 3.1* 3.7* 4.1*     Estimated Creatinine Clearance: 17.6 mL/min (A) (based on SCr of 4.1 mg/dL (H)). - Adjusted Ideal Body Weight CrCl Range (Ideal BW, Actual BW): (17.6, 20) ml/min                                    Therapeutic  Drug Monitoring  N/A  Microbiology <redacted file path>:  12/01/2024 Blood culture x 2 (1 of 2) with Granulicatella adiacens  eRVP negative  Urine strep/legionella antigens negative  12/02/2024 MRSA nares negative  Nasopharyngeal corynebacterium diphtheriae culture pending  Group A strep PCR negative  12/03/2024     Blood culture NGTD 12/07/2024 C-diff negative    GIPP negative  12/08/2024 eRVP negative    Blood culture x 2 negative to date   BAL eRVP negative    BAL bacterial culture 1+ Enterobacter cloacae complex - Cefepime -SDD, Mero-S, Erta-R  12/09/2024 Plasma EBV pending  12/10/2024 Blood cultures x 2 pending    Virus Testing:  Recent Labs    11/05/24 1007 11/15/24 0942 11/25/24 0948 12/02/24 0912 12/09/24 1332  EBVCOPY 24,100* 16,900* 35,900* 49,300* 47,300*  EBVDNA DETECTED* DETECTED* DETECTED* DETECTED* DETECTED*    Serologies:  Patient:  Recent Labs    04/04/24 0932 11/05/24 1007 11/14/24 0555  CMVIGG Negative  --   --   HBSAG NonReactive  --  NonReactive  HEPBSAB Positive*  --  Positive  HBCAB  --   --  NonReactive  VCAIGG Negative Negative  --   VCAIGM Negative Negative  --   EBNA Negative Negative  --   EBVEAIGG Negative Negative  --    Donor Serologies (Solid Organ Only): CMV IgM:  CMV IgG:    EBV VCA IgM:  EBV VCA IgG:  EBNA IgM:  EBNA IgG:  EBV total:  HBsAg:  HBsAb:  HBC total:  HBV:  HBV DNA:   HCV:  HCV Ab:   HCV RNA:   HIV-1:  HIV-2:  HIV RNA:  HTLV:   RPR:    Toxo IgM:  Toxo IgG:  EKG: Recent Labs    12/02/24 0118 12/04/24 0936 12/05/24 0313 12/05/24 1328 12/08/24 0115  QTC [phone]   Pathology <redacted file path>: data personally reviewed in [place], notable for:   11/07/2024: Left cervical lymph node biopsy:  large B-cell lymphoma with extensive necrosis, EBV positive, in post-transplant setting. See comment.   Comment: Sections show an infiltrate of pleomorphic large lymphoid cells  with irregular nuclear contour and occasional plasma cells in a background of extensive coagulative necrosis in what appears to be a diffuse cellular infiltrate. No residual nodal architecture is appreciated. The corresponding flow cytometric study ([id number]) is limited by excess debris and is non-contributory.  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  Radiology <redacted file path>: data personally reviewed in [place], notable for:   12/01/2024: CT neck soft tissue without contrast  1.  Motion degraded exam. Within these limits, worsening findings of pharyngitis with probable component of supraglottitis. No drainable fluid collections within the limitations of noncontrast technique. 2.  Interval enlargement of multistation cervical and thoracic lymphadenopathy and lymphoid tonsillar tissue as compared to 11/05/2024, consistent with history of PTLD. 3.  Mild fat stranding, platysmal thickening, and skin thickening in the anterior neck, may represent cellulitis versus soft tissue edema, correlate with exam.   12/08/2024 CXR  1. Cardiac and mediastinal silhouette is similar to prior exam.   2. Increasing RIGHT upper lobe opacity since previous study may represent sequela of aspiration. Additional bilateral heterogeneous opacities, concerning for multifocal infection. Known pulmonary nodules are difficult to evaluate. 3. No large effusion or pneumothorax. 4. Interval intubation with endotracheal tube 3.7 cm above the carina. Remaining support appliances are unchanged.  ASSESSMENT AND PLAN   ASSESSMENT: 69 y.o. 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 He is admitted with progressive odynophagia and confusion. ID consulted for letermovir  continuation and antibiotic recommendations for pharyngitis/supraglottitis. Now intubated with ongoing fevers.   Neutropenic fever  RUL opacity c/f aspiration pneumonia - BAL with Enterobacter cloacae  New fever in the AM of 1/19 amidst C1D6 R-GDP, iso neutropenia. Intubation after aspiration event 1/19. BAL with secretions in the upper airway and new RUL opacity on CXR. BAL cultures are growing enterobacter cloacae complex which has resulted susceptible but dose dependent to cefepime . Recommend escalation to meropenem today.   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 Nazir is well known to me from the ID outpatient clinic. He presented in late Nov - early Dec with progressive odynophagia and cervical lymphadenopathy and was found to have a rising EBV PCR prompting biopsy of a cervical lymph node (12/19) and found to have EBV + PTLD. PET CT with multistation LN above and below the diaphragm (12/26). Started on Rituximab.   Presented 1/12 with acute worsening of odynophagia and intermittent confusion along with a one time fever to 38.0 (1/12). CT with diffuse pharyngeal/supraglottic edema, which is corroborated on ENT exam. No evidence of abscess. Given similar symptomatology and rising EBV PCR (49,300 as of 12/03/24) along with interval increase in lymphadenopathy since his last CT (12/17), most concerned for progression of lymphoma.   Granulicatella adiacens  in 1 of 2 (1/12) blood cultures  On 1 of 2 admission blood cultures positive with Granulicatella adiacens. This is part of oral flora and could be a contaminant, however the patient was febrile on admission when blood cultures were collected. It is difficult to follow his fever curve subsequently given initiation of steroids. The likelihood of neutropenia in  the near future further complicates the picture. Repeat blood cultures are negative at 48 hours. Favor continuing IV vancomycin  and treating as simple GP bacteremia x 14 days from the date of negative cultures (1/14 - 1/28).     RECOMMENDATIONS:  - Stop cefepime , start meropenem 500 mg q12 hours  - Follow up 1/21 blood cultures  - Continue vancomycin  for Granulicatella through 1/28  - Continue acyclovir, letermovir , and TMP-SMX ppx   For questions during the day page Transplant ID Team [phone]) or attending of record directly (see provider team).   Infectious Diseases is available in-house from 8A-5P. If urgent assistance is needed outside of these hours, please page the Infectious Diseases Team Pager assigned to the patient for assistance. The Infectious Diseases consult pagers are available after business hours for emergencies only.   Available records reviewed at time of consult. Recommendations discussed with primary service.  Adina Pool, MD Transplant Infectious Disease Fellow     ------------------------------------------------------------------------------- Attestation signed by Arif, Sana, MD at 12/12/2024 11:11 AM Attestation Statement:   I personally saw and evaluated the patient, and participated in the management and treatment plan as documented in the resident/fellow note.  MARSTON CAP, MD  ------------------------------------------------------------------------------- "

## 2024-12-11 NOTE — Progress Notes (Signed)
 "   MICU Progress Note   HOSPITAL ADMISSION DATE 12/01/2024  ICU ADMISSION DATE 12/01/2024  HOSPITAL DAY 11  ATTENDING OF RECORD Bergin, Garnette Dover, MD  HOUSE OFFICER ALEEN DAMIEN ROW, MD   Nicholas Glenn is a 69 y.o. 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 Also noted to have somewhat altered mentation. He is being transferred to the MICU for c/f malignant airway obstruction.   INTERVAL EVENTS   Day: - CXR with RLL atelectasis intervally worsened.  - Cr uptrending 3.1 -> 3.7 -> 4.1 - EBV 47300 from 49300  - Cytology from bronch 1/19 w/ no evidence of malignancy; AFB stain equivocal for acid fast bacilli  - s/p 1uPRBCs and 1u Plts this AM. Recheck Plt 19, 2 additional units given - Ongoing diarrhea, Cdiff negative on 1/18, likely malabsorptive and osmotic, rectal pouch and loperamide  prn - Stopped cefepime , switched to meropenem per ID - Holding tube feeds prior to cryo bronch this afternoon - Wife at bedside, updated. - Renal US  ordered per neph - MRI brain ordered per malignant heme - Restarted tube feeds after bronch, unheld glargine 5 units and regular insulin  3 units q6  Night: - 1u pRBC for Hgb 6.9  - Weaning sedation, propofol 60 -> 30 overnight - MRI brain wet read with no acute intracranial abnormalities, punctate age-indeterminate microhemorrhage in left frontal lobe  MICU Course: 12/01/24: Admitted for airway watch due to concern for laryngeal edema  12/02/24: started insulin  gtt for euglycemic DKA. Bcx 1/2 growing GPCs. 12/03/24: Started chemo  1/15-1/17/26: Intermittently requiring precedex for  agitation, ultimately switched to prn haldol. Tolerating diet.  12/07/24: Sleepy during rounds after being awake for most of the night. Diarrhea, negative GIP panel and C. Diff. 12/08/24: Intubated for worsening respiratory status. Given IV lasix  80 x2 + diuril 250. 12/09/24: Holding dex, attempting cross titration of prop to precedex. Afib with RVR, responded to 5 mg IV metop. 12/10/24: Bloody secretions and clots likely 2/2 ET tube suctioning iso of worsening thrombocytopenia. Bronch with clot in R bronchus intermedius. 12/11/24: Cryo bronch done today. Cr uptrending. Stopped cefepime , started meropenem.   VITAL SIGNS    Current Vital Signs 24h Vital Sign Ranges  T 37.9 C (100.2 F) (12/12/24 0300) Temp  Avg: 37.9 C (100.3 F)  Min: 37.5 C (99.5 F)  Max: 38.5 C (101.3 F)  BP (!) 166/69 (12/11/24 2133) BP  Min: 165/70  Max: 166/69  HR 97 (12/12/24 0600) Pulse  Avg: 102.1  Min: 91  Max: 112  RR 22 (12/12/24 0600) Resp  Avg: 24  Min: 17  Max: 37  O2sat 99 % Ventilator (12/12/24 0600) SpO2  Avg: 96.9 %  Min: 92 %  Max: 100 %  CVP      Intake/Output (24 hours): I/O last 2 completed shifts: In: 3117.1 [I.V.:405.3; Blood:1184.5; NG/GT:600; IV Piggyback:222.3] Out: 985 [Urine:985] 01/22 0701 - 01/23 0700 In: 2923.9 [I.V.:495.8] Out: 951 [Urine:701] Last Weight: 80.2 kg (176 lb 12.9 oz) (12/12/24 0533)  Admit Weight: 73.5 kg (162 lb 0.6 oz) (12/01/24 1836)  AFP:Anib mass index is 26.25 kg/m. Height: 174.8 cm (5' 8.82)     PHYSICAL EXAMINATION   GENERAL: intubated, chronically ill-appearing. HEENT:  atraumatic NECK: trachea midline PULM: Mechanically ventilated, lungs diffusely rhoncorous improved from prior. Decreased breath sounds on the R. CV: difficult to appreciate heart sounds, but regular rate ABDOMEN: soft, non-tender, non-distended. +BS RECTAL: deferred EXTREMITIES: warm, well perfused, 2+ DP pulses, no lower extremity edema NEURO: intubated and sedated.   GCS: [place]  Coma Scale Score: 6 (12/11/24 2000)  RASS: [place] Agitation Sedation Scale %%: -3 Moderate sedation - Movement or eye opening to voice (but no eye contact) CAM-ICU: Delirium Assessment: RASS -4 - Unable to Assess  Tubes, Lines & Airways: Patient Lines/Drains/Airways Status     Active Lines,Drains,Airways     Name Placement date Placement time Site Days   Peripheral IV 12/01/24 Right Antecubital 12/01/24  1200  Antecubital  less than 1   Peripheral IV 12/02/24 Right;Anterior;Mid Cephalic;Upper arm 12/02/24  0425  Cephalic;Upper arm  less than 1   AV Fistula/AV Graft (AVF/AVG) 06/27/21 AV Fistula (AVF) Left Upper Arm 06/27/21  1000  -- 1253   Wound 12/01/24 Bilateral Perineum 12/01/24  2000  Perineum  less than 1            CURRENT MEDICATIONS   SCHEDULED MEDICATIONS  acyclovir, 400 mg, Via Tube, Q12H SCH allopurinol, 100 mg, Via Tube, Daily [Held by provider] apixaban , 5 mg, Oral, Q12H SCH banana flakes-GOS-fiber, 1 packet, Via Tube, Q8H famotidine, 20 mg, Via Tube, Daily filgrastim-ayow, 300 mcg, Subcutaneous, QPM [Held by provider] heparin  (porcine), 5,000 Units, Subcutaneous, Q12H SCH insulin  GLARGINE-yfgn, 5 Units, Subcutaneous, QHS insulin  REGULAR (HumuLIN R , NovoLIN R) injection, 3 Units, Subcutaneous, Q6H SCH insulin  REGULAR (HumuLIN R , NovoLIN R) injection, 0-12 Units, Subcutaneous, Q6H SCH letermovir , 480 mg, NG Tube, Daily loperamide , 2 mg, Via Tube, BID meropenem (MERREM) IV Traditional Therapy, 500 mg, Intravenous, Q12H PAIN-AGITATION-DELIRIUM PROTOCOL, 1 each, XX, PAD ASSESSMENT predniSONE , 5 mg, Via Tube, Daily [Held by provider] rosuvastatin , 10 mg, Oral, QHS sirolimus, 2 mg, Via Tube, Daily sulfamethoxazole -trimethoprim , 10 mL, Via Tube, QMWF tranexamic acid, 500 mg, Nebulization, TID RT [Held by provider] traZODone, 50 mg, Via Tube, QHS vancomycin  dosing per levels, , XX, As Directed Admin    MEDICATION INFUSIONS  fentaNYL citrate propofol infusion,  Last Rate: 30 mcg/kg/min (12/12/24 9376) sodium chloride , Last Rate: 5 mL/hr at 12/12/24 0623     PRN MEDICATIONS  acetaminophen , 975 mg, Via Tube, Q6H PRN dextrose, 50 mL/hr, Intravenous, As Directed dextrose 50% in water, 12.5-25 g, Intravenous, As Directed fentaNYL, 25 mcg, Intravenous, Q15 Min PRN  And fentaNYL citrate, 12.5-150 mcg/hr, Intravenous, Continuous PRN  And fentaNYL, 12.5-150 mcg, Intravenous, Q30 Min PRN glucagon, 1 mg, Subcutaneous, As Directed hydrALAZINE, 5 mg, Intravenous, Q6H PRN lime water-olive oil, , Topical, TID PRN loperamide , 2 mg, Via Tube, QID PRN propofol infusion, 5-60 mcg/kg/min (Dosing Weight), Intravenous, Continuous PRN     Current Diet Order: Nutrition Orders         DIET NPO Effective Now: NPO starting at 01/13 0059       LABORATORY AND IMAGING DATA   Recent Labs  Lab 12/11/24 1132 12/11/24 1217 12/12/24 0037  NA 147*   < > 147*  K 4.4  --   --   CL 115*  --   --   CO2 20*  --   --   BUN 84*  --   --   CREATININE 4.1*  --   --   GLUCOSE 202*   < > 227*  ANIONGAP 12  --   --   CALCIUM  8.7  --   --    < > =  values in this interval not displayed.   Recent Labs  Lab 12/10/24 0147 12/10/24 0158 12/11/24 0355 12/11/24 1922  ALT 17  --   --   --   AST 23  --   --   --   ALKPHOS 86  --   --   --   TBILI 0.5  --   --   --   ALB 1.9*  1.9*  --  1.8*  --   APTT  --  40.7*  --   --   INR  --  1.1  --  1.1    Recent Labs  Lab 12/11/24 1922  WBC 0.6*  HGB 6.9*  HCT 21.7*  PLT 36*   No results for input(s): PROBNP, HSTNT, HGBA1C, TSH, T4FREE in the last 168 hours.    GLUCOSE RANGE Recent Labs  Lab 12/11/24 1837 12/11/24 2030 12/11/24 2343 12/12/24 0500  POCGLU 218* 200* 187* 190*      ECHO Results for orders placed during the hospital encounter of 08/21/23  Echo complete  Narrative [facility]            [place], [place] [id number]    DOB: July 10, 1956 CARDIAC DIAGNOSTIC UNIT                Date: 08/21/2023 11:15:00 Adult     Male   Age: 24 ECHO-DOPPLER REPORT                 Outpatient MPDC ---------------------------------------------------- MD1: Orlando Roseline Shilling STUDY: Chest Wall          TAPE: 0000:00:0:00:00 BP: 128/62 ECHO: Yes   DOPPLER: Yes  FILE: [phone]:   HR: 85 COLOR: Yes  CONTRAST: No      MACHINE: [id number] #3 Height: 68 in RV BIOPSY: No         3D: Yes  SOUND QLTY: Moderate  Weight: 196 lbs MEDIUM: None                                      BSA: 2.07,  BMI: 29.80 ------------------------------------------------------------------------------ HISTORY: pre- Renal Transplant REASON: Assess LV function INDICATION: Z01.810 - Encounter for preprocedural cardiovascular examination.   ECHOCARDIOGRAPHIC MEASUREMENTS ----------------------------------------------- 2D DIMENSIONS AORTA          Values     Normal RangeMAIN PA      Values     Normal Range Annulus:  nm*  cm    [2 - 3.2]      PA Main:   2.7 cm    [1.5 - 2.1] Aorta Sin:   3.5 cm    [2.8 - 4]   RIGHT VENTRICLE ST Junction:  nm*  cm    [2.3 - 3.5]    RV Base:   3.5 cm    [2.5 - 4.1] Asc.Aorta:   2.6 cm    [2.2 - 3.8]     RV Mid:   2.6 cm    [1.9 - 3.5] LEFT VENTRICLE                         RV Length:  nm*  cm    [  ] LVIDd:   4.1 cm    [4.2 - 5.8] RIGHT ATRIUM LVIDs:   3.2 cm    [2.4 - 4]      RA Area:  14   cm2   [ <=  20] LVEDVi:  75.0 ml/m2 [34 - 74]         RAVi:  17   ml/m2 [11 - 39] LVESVi:  40.0 ml/m2 [11 - 31]   INFERIOR VENA CAVA FS:  22   %     [ >= 25]       Max.IVC:   0.8 cm    [ <= 2.1] SWT:   1.4 cm    [0.6 - 1]      Min.IVC:   0.4 cm    [ <= 1.7] PWT:   1.1 cm    [0.6 - 1]   __________________ LEFT ATRIUM                           nm* - not measured LA Diam:   2.6 cm    [3 - 4] LA Area:  15   cm2   [ <= 20] LA Volume:  40   mL    [18 - 58] LAVi:  19   ml/m2 [16 - 34]  ECHOCARDIOGRAPHIC DESCRIPTIONS   ----------------------------------------------- AORTIC ROOT Size:  Normal Dissection: INDETERM FOR DISSECTION  AORTIC VALVE Leaflets: Tricuspid             Morphology: Normal Mobility: Fully Mobile  LEFT VENTRICLE                                      Anterior: Normal Size: SMALL                                  Lateral: Normal Contraction: REGIONALLY IMPAIRED                     Septal: HYPOCONTRACTILE Closest EF: 50% (Estimated)  Calc.EF: 47% (3D)      Apical: Normal LV masses: No Masses                             Inferior: HYPOCONTRACTILE LVH: MILD LVH                             Posterior: Normal LV GLS(GE): -10.2% Normal Range [ <= -16] Dias.FxClass: RELAXATION ABNORMALITY (GRADE 1) CORRESPONDS TO E/A REVERSAL  MITRAL VALVE Leaflets: Normal                  Mobility: Fully mobile Morphology: Normal  LEFT ATRIUM Size: Normal LA masses: No masses Normal IAS  MAIN PA Size: DILATED  PULMONIC VALVE Morphology: Normal Mobility: Fully Mobile  RIGHT VENTRICLE Size: Normal                    Free wall: Normal Contraction: Normal                    RV masses: No Masses TAPSE:   1.7 cm,  Normal Range [>= 1.6 cm] RV Note:  TV S' = 10cm/s  TRICUSPID VALVE Leaflets: Normal                  Mobility: Fully mobile Morphology: Normal  RIGHT ATRIUM Size: Normal                     RA  Other: None RA masses: No masses  PERICARDIUM Fluid: TRIVIAL FLUID  ANTERIOR  INFERIOR VENACAVA Size: Normal     Normal respiratory collapse  DOPPLER ECHO and OTHER SPECIAL PROCEDURES ------------------------------------ Aortic: No AR                  No AS  Mitral: No MR                  No MS MV Inflow E Vel.= 56.0 cm/s  MV Annulus E'Vel.= 5.0 cm/s  E/E'Ratio= 11  Tricuspid: TRIVIAL TR             No TS  Pulmonary: TRIVIAL PR             No PS  Other:  INTERPRETATION --------------------------------------------------------------- MILD LV DYSFUNCTION (See above) WITH MILD LVH NORMAL LA PRESSURES WITH NORMAL DIASTOLIC FUNCTION NORMAL RIGHT  VENTRICULAR SYSTOLIC FUNCTION VALVULAR REGURGITATION: TRIVIAL PR, TRIVIAL TR NO VALVULAR STENOSIS TRIVIAL PERICARDIAL EFFUSION INSUFFICIENT TR JET TO CALCULATE RVSP 3D acquisition and reconstructions were performed as part of this examination to more accurately quantify the effects of identified structural abnormalities as part of the exam. (post-processing on an Independent workstation).  Compared with prior Echo study on 09/14/2022: SLIGHT DECREASE IN LVEF ON TODAY'S STUDY   (Report version 3.0)                    Interpreted and Electronically signed Perform. by: Sari Hancock, RDCS, RV             by: Therisa Olam Andreas, MD Resp.Person: Vella Galt, RDCS                     On: 08/21/2023 15:21:16   DIAGNOSTIC IMAGING & PROCEDURES 1/12 CXR: Unchanged cardiomediastinal silhouette. Redemonstrated postsurgical changes of the left lung.   Basilar heterogeneous opacities may reflect atelectasis but better characterized on prior PET CT.   Trace bilateral effusions. No pneumothorax.  CT brain: NAICA  CT neck findings below   ASSESSMENT AND PLAN    PULMONARY  # Acute hypoxic respiratory failure  # C/f enterobacter cloacae PNA # C/f pulmonary edema # Intubated # C/f malignant airway obstruction Intubated 1/19 for worsening respiratory status and inability to protect airway in setting of delirium. Initially presented with odynophagia, muffled voice, and airway edema from PTLD-related lymphadenopathy. CT neck showed pharyngitis with probable supraglottitis and enlarged cervical/thoracic LAD. ENT scopes showing improvement in airway swelling. Rapid improvement suggests primary etiology was aspiration with compromised airway protection rather than pure pulmonary edema. Bronchoscopy 1/19 growing Enterobacter cloacae and 1+ GPCs. Continues to fever and rigor. From an acid base status he appears to be in a compensated state for his respiratory alkalosis. - Abx as below - Intubated  (1/19) - Decided to continue holding diuresis for now - Passed cuff leak test prior to acute ON events occurring w/ blood on airway 1/21. Reassess in the subsequent days after cryo bronch for stabilized airway, resolution of post-procedure inflammation, and confirmation of hemostasis - ENT consulted, signed off  - Improvement in airway swelling observed on daily scopes   - No indication for continued airway watch in ICU   - Dobhoff in place for meds   # Clots in ET tube Morning 1/21 noted to have bloody secretions and clot in ET tube. Most probable etiology from routine ET tube suctioning inducing trauma to the airway iso of worsening thrombocytopenia. Elevated fibrinogen and d-dimer.  - TXA nebs TID -  Holding heparin   - CTM - Bedside bronch 1/21 - partially obstructing clot found in bronchus intermedius  - IP consulted for cryo clot removal, planned for 1/22   #OSA on home CPAP Per ENT, ok to continue nightly CPAP despite airway concern.  - CPAP nightly once extubated  INFECTIOUS DISEASES # Febrile neutropenia # Granulicatella adiacens bacteremia # Enterobacter cloacae pneumonia Blood culture 1/13 positive for Granulicatella adiacens (1 of 2 bottles). Bronchoscopy 1/19 growing Enterobacter cloacae (susceptible to cefepime ) and 1+ GPCs. WBC 0.7, neutropenic from chemotherapy. Remains intermittently febrile - ID consulted, appreciate recs Granulicatella bacteremia: - Continue vancomycin  (1/14-p) - plan to treat until 1/28 for a total of 14d after first cx clearance  - 1/19 blood cx prelim NGTD - f/u 1/21 blood cx Enterobacter cloacae pneumonia: - Cefepime  (1/19-1/22) - Meropenem (1/22-p)  # Pharyngitis with tonsillar exudate # C/f supraglottitisis  Presented with tachycardia, low grade fever, pharyngitis and tonsillar exudate. ENT raised concern for diphtheria given gray exudate on 1/12 laryngoscopy, but transplant ID with low concern. MRSA nares negative, eRVP negative.  Dx:  -  MRSA nares negative; eRVP was negative - urine strep, legionella negative - 1/12 Bcx: 1/2 sets positive for GPC in pairs and/or chains  - f/u 1/14 repeat Bcx, NGTD 72 hrs - s/p zosyn (1/12 - 1/13) - s/p CTX (1/13-1/15) - continue vancomycin  as above  # Infectious prophylaxis On multiple prophylactic agents iso immunosuppression, as below.  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 Rising creatinine iso of chemotherapy, infection, component of congestion w/ underlying poor renal reserve. No signs of rejection. Immunosuppression appropriately reduced for PTLD treatment. MMF held since December for EBV.  - Transplant nephrology consulted, appreciate recs - Continue home prednisone  5 mg while holding decadron  as above - Continue sirolimus per following regimen: 4mg  1/16, 4mg  1/17, 2mg  daily starting 1/18  [ ]  Sirolimus level 1/23 AM - HOLD tacro 0.5mg  BID (home tacrolimus  XR 0.75 mg daily) per nephrology starting 1/19 - Goal FK level 3-5.  - HOLD home paricalcitol  - Continue PJP and antiviral ppx as above - Metolazone x1 for volume component as he has responded favorably in the past - HD consent obtained by nephrology - Urine electrolytes - c/w intrinsic renal injury  - Renal US  ordered for further evaluation of AKI  # Hypernatremia, resolved   # Perinephric seroma Previously noted since August 2025 after most recent transplant, for which drain was placed on 11/10. CT imaging on December admission show collection enlarged to 5 cm and drain not in contact with collection. No e/o kidney compression. Drain culture from 12/19 negative, drain was removed by nephrology on 12/29. Renal transplant US  1/14 w/o hydronephrosis. Renal vessels  patent with normal velocities. Unchanged simple peritransplant fluid collection.   HEMATOLOGICAL # EBV+ monomorphic PTLD, DLBCL subtype  Pt with PTLD ISO second renal transplant with known EBV+ donor. Cannot receive additional anthracycline therapy given anthracycline-related CHF. 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 PET 12/26 with multifocal LAD above and below diaphragm, hepatic and splenic lesions. BMBx 12/26 NED. Received Solumedrol 80 mg x1 12/26. Started on weekly Rituxan on 12/27 with plan for 4 doses (has received 3  doses total). EBV PCR (16,900 ? 49,300). Severe pancytopenia from chemotherapy. On G-CSF since 1/19. Cannot receive anthracyclines given anthracycline-induced HFrEF. - Oncology consulted and following  - EBV: 49300 (increased from prior) - pending 1/20  - GDP (Gemcitabine 800 mg/m2, Dexamethasone , Carboplatin AUC 3)  C1D1 on 1/15, holding as of 1/19 iso infection - Steroid regimen ordered per treatment protocol (decadron  taper) - holding for now since 1/19 - Continue G-CSF (1/19-p) daily until ANC near normal or normal - Restarted home prednisone  5 mg daily - Continue allopurinol 150 mg daily  - Follow-up 1/20 EBV PCR (pending) - elevation previously noted concerning but expected to improve with eventual resumption of chemotherapy  #Pancytopenia Newly thrombocytopenic and neutropenic since admission. Likely post-chemo related myelosuppression - Monitor daily CBC - Continue GCSF (1/19-p) - s/p 2u Plt transfusion 1/21, 1/22 - Goal plt >50 for bronch as above   #H/o metastatic undifferentiated pleomorphic sarcoma  # Bilateral solid lung nodules # Cervical and thoracid LAD and lymphoid tonsillar tissue Pt has history of metastatic sarcoma s/p primary tumor resection f/b RT (2014), doxorubicin x6 cycles (2015), LUL wedge resection x2 (2016, 2022). Bilateral lung nodules on 10/2024 CT consistent with  recurrent metastatic disease. Currently under surveillance. - Defer further evaluation until PTLD treatment completed  NEUROLOGICAL / PSYCHIATRIC # Encephalopathy # Sedated Pt w/ AMS since 1/12 admission, initially presenting with disorientation and confusion. Ddx included infection, as pt febrile and tachycardic on presentation, steroid-induced delirium (high-dose dex for PTLD), structural etiology also possible iso known malignancy (PTLD, metastatic sarcoma), though CTH without acute abnormalities on admission. Intubated 1/19 for inability to protect airway and worsening lethargy in setting of hyperactive delirium.  - Most important intervention for this was discontinuing high-dose dex - continue holding - IV haldol 5mg  q6h prn - holding nightly zyprexa while intubated - On PAD protocol - D/c precedex - Remain on propofol for cryo bronch 1/22, once completed and if bleeding remains under control will resume cross-titrating with precedex to target RASS 0 to -1 and for eventual extubation. Precedex preferred for patients w/ hyperactive delirium and may shorten delirium while facilitating extubation - MRI brain ordered 1/22   GASTROINTESTINAL / NUTRITION # Nutrition # Diarrhea On tube feeds via dobhoff. Diarrhea, C. diff negative 1/18, GIP panel negative. Likely multifactorial etiology including medications (antibiotics, tube feed formula), enteral feeding itself, malabsorption. - tube feeds adjusted with fiber added - held prior to bronch - Imodium  PRN   ENDOCRINE # T2DM # Steroid-induced hyperglycemia A1c 6.9 in November 2025, repeat 11/2023 7.2. Has had steroid-induced hyperglycemia previously. Was discharged last admission on a home regimen of Lantus  14 units QHS, Humalog insulin  to carb ratio of 1:5 and a correction scale of 1:30 above 140 and  Jardiance  25 mg daily. - Holding glargine 14 units -> reduced to 5 u nightly 1/21 - Holding regular insulin  5 units q6h - SSI - Target glucose  140-180 mg/dL - STOP home jardiance , now contraindicated d/t euglycemic DKA  # Euglycemic DKA, resolved  On 1/13 pt developed worsening metabolic acidosis with pH 7.20/pCO2 30 on VBG and bicarb 12 and increasing anion gap to 17. AKI also worsening. BHB elevated to 5.66. Other work-up for metabolic acidosis unremarkable including lactate and TLS labs. Home jardiance  likely a contributing factor, had been held since admission.  - POC glucose ACHS - RFP q8h - s/p insulin  gtt   - DM management as above   CARDIOVASCULAR #Hx atrial fibrillation/ flutter, controlled Episode of AFib with RVR  1/20 (HR to 170), responded to IV metoprolol 5 mg. On home carvedilol  37.5 mg q12h. Apixaban  held for thrombocytopenia (Plt 50). CHA2DS2-VASc score likely >=2 (age 60, HF, HTN) indicating high stroke risk. Continues with some intermittent SVTs. 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 On home carvedilol  37.5 mg q12h - Holding torsemide  - appropriate given rising creatinine and adequate UOP  #HLD #HTN Home carvedilol  37.5 mg q12h - Holding rosuvastatin     MUSCULOSKELETAL NAI   ICU SAFETY CHECKLIST  Feeding Tube feeds (holding Soft and bite sized diet)  Analgesia tylenol  prn  Sedation Propofol  Thromboembolic Prophylaxis Holding home apixaban . Holding heparin  for dvt ppx   Head of Bed Elevated Y   Ulcer Prophylaxis Famotidine  Glycemic Control SSI, insulin  regimen as above. BG goal <180. Accucheck ACHS.  Bowel Regimen  Last BM Date: 12/12/24 (12/12/24 0000)  Family updated yes  PT/OT/Early mobility ordered yes  Procedures done in past 24hr N  Need for Invasive lines reviewed: yes  + CVC Duration N/a  + Intubation Days N/a Ventilator day:     Code Status: Full Code  Day: Kyla Creed Shave, MS4 Noted  reviewed and edited by:  Rueben Shaggy, MD Internal Medicine, PGY-1  Night:  Aleen Row, MD Medicine-Pediatrics, PGY-2 Pager: [phone]  ------------------------------------------------------------------------------- Attestation signed by Mariel Garnette Dover, MD at 12/12/2024  6:13 PM Date of Service: 12/12/2024  Nicholas Glenn remains critically ill with acute hypoxemic respiratory failure from aspiration and compromised ability protect the airway complicating management of PTLD with upper airway infiltration and corticosteroid induced hyperactive delirium.  There were no significant respiratory events overnight.  However, he developed an acute drop in tidal volume this morning and supraventricular tachycardia.  A blood clot was suctioned from the artificial airway with restoration of tidal volume.  Atrial fibrillation with rapid ventricular response persisted and was treated with two 5mg  doses of intravenous metoprolol before spontaneous conversion to sinus rhythm.  We repeated bronchoscopy this afternoon and removed a moderate sized blood clot from the distal left mainstem bronchus.  There were thin bloody secretions pooled beyond the clot.  In the right lower lobe, there were blood-tinged mucoid secretions clear with therapeutic aspiration.  We are continuing meropenem for treatment of Enterobacter pneumonia likely precipitated by aspiration.  The airway bleeding is most likely due to minor mucosal trauma from inline suctioning in the setting of already inflamed airways and severe thrombocytopenia.  Following bronchoscopy, sedation will be transition from propofol to dexmedetomidine for goal RASS 0 to -1.  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  Attempting diuresis.  Of note, he more upper extremity edema (relative to the lower extremities).  The SVC was widely patent on prior imaging though we would have low threshold to assess for acute thrombus or any new SVC  pathology if the upper extremity swelling progresses.  Right parotid gland lesions were noted on MRI obtained for evaluation of encephalopathy.  Whether these represent PTLD infiltration is not yet clear.  We have low suspicion for parotid infection.  I spent 95 minutes in performing critical care.  The patient required critical care services due to acute hypoxemic respiratory failure, compromised ability protect the airway with multifactorial encephalopathy, and atrial fibrillation with rapid ventricular response and the threat to imminent deterioration  of this condition.   Critical care time reflects my personal work performing the following specific actions at the patient's bedside with the multidisciplinary team:  Critical care management included urgent airway management, active management of invasive ventilation, assessment and interpretation of ongoing invasive hemodynamic monitoring, management of unstable hemodynamics with  intravenous beta-blockers, continuous neurologic assessment due to acute, impending, or ongoing neurologic compromise, management of sedative infusions, and management of acute hemorrhage. See chart documentation for additional detail  Additional activities performed during the critical care time included: [x]   Review of overnight and recent events [x]   Review of medications, allergies, and vital signs  [x]   Serial data review [x]   Ordering, interpreting, and reviewing diagnostic studies/lab tests [x]   Clinical examination [x]   High complexity medical decision-making  This time also includes time spent for documentation.   This time does not include time spent in performing any separately billed procedures.  Attestation Statement:   I personally saw and evaluated the patient, and participated in the management and treatment plan as documented in the resident/fellow note.  STEPHEN BELVIE MAIERS, MD  Pulmonary Diseases and Critical Care Medicine  This note  was partially generated with speech recognition software.  Please excuse any transcription or grammatical errors present despite attempts to carefully edit.             ------------------------------------------------------------------------------- "

## 2024-12-12 NOTE — Progress Notes (Signed)
 "  Nephrology Consult Progress Note  Interval History   Received 3 u platelets and 2 u pRBC last 24 hours Broadened ABX from cefepime  to meropenem based on sensitivities  Ongoing high stool output  Slightly increased fluid collection on renal US   Creatinine continues to rise  ~700 mL of urine, decreased and net positive 2 L   Medications    acyclovir  400 mg Via Tube Q12H SCH   allopurinol  100 mg Via Tube Daily   [Held by provider] apixaban   5 mg Oral Q12H SCH   banana flakes-GOS-fiber  1 packet Via Tube Q8H   famotidine  20 mg Via Tube Daily   filgrastim-ayow  300 mcg Subcutaneous QPM   [Held by provider] heparin  (porcine)  5,000 Units Subcutaneous Q12H SCH   insulin  GLARGINE-yfgn  5 Units Subcutaneous QHS   insulin  REGULAR (HumuLIN R , NovoLIN R) injection  3 Units Subcutaneous Q6H SCH   insulin  REGULAR (HumuLIN R , NovoLIN R) injection  0-12 Units Subcutaneous Q6H SCH   letermovir   480 mg NG Tube Daily   loperamide   2 mg Via Tube BID   meropenem (MERREM) IV Traditional Therapy  500 mg Intravenous Q12H   NOREPINephrine in sodium chloride  0.9%       PAIN-AGITATION-DELIRIUM PROTOCOL  1 each XX PAD ASSESSMENT   predniSONE   5 mg Via Tube Daily   [Held by provider] rosuvastatin   10 mg Oral QHS   sirolimus  2 mg Via Tube Daily   sulfamethoxazole -trimethoprim   10 mL Via Tube QMWF   tranexamic acid  500 mg Nebulization TID RT   [Held by provider] traZODone  50 mg Via Tube QHS   vancomycin  dosing per levels   XX As Directed Admin   Objective     Current Vital Signs 24h Vital Sign Ranges  T (!) 38.1 C (100.6 F) Temp  Avg: 37.9 C (100.2 F)  Min: 37.5 C (99.5 F)  Max: 38.3 C (100.9 F)  BP (!) 166/69 BP  Min: 165/70  Max: 166/69  HR 94 Pulse  Avg: 101.1  Min: 88  Max: 177  RR 24 Resp  Avg: 23.4  Min: 17  Max: 39  SaO2 95 % Ventilator SpO2  Avg: 97.7 %  Min: 95 %  Max: 100 %          Ins & Outs I/O last 2 completed shifts: In: 2956.6 [I.V.:506.9;  Blood:1145; NG/GT:500; IV Piggyback:222.2] Out: 951 [Urine:701; Stool:250] Current Shift:  01/23 0701 - 01/23 1900 In: 646.3 [I.V.:178.1] Out: 155 [Urine:155]   Physical Exam Gen:   Critically ill appearing CV:   RRR Pulm:   Intubated, mechanically ventilated  Abd:   soft, NTND MSK:   Minimal pedal edema   Dialysis Catheters/Urinary Catheters   AV Fistula/AV Graft (AVF/AVG) 06/27/21 AV Fistula (AVF) Left Upper Arm (Active)  Placement Date/Time: 06/27/21 1000   LDA present on assessment/arrival: Yes  Access Type: AV Fistula (AVF)  Orientation: Left  Access Location: Upper Arm  Number of days: 1260      Invasive Urinary Catheters   Urethral Catheter Temperature probe (Active)  Placement Placement Date/POA/Time: 12/08/24 1200   Placed at: Inpatient Unit  Insertion bundle used?: Yes  Difficult insertion: No  Catheter Type: Temperature probe  Catheter Balloon Size: 10 mL  Procedure Tolerance: Tolerated Well  Urine Return Obtai...  Number of days: 0      Labs   Recent Labs  Lab 12/11/24 1614 12/11/24 1922 12/12/24 0500  WBC 0.6* 0.6*  0.8*  HGB 7.0* 6.9* 7.9*  PLT 42* 36* 25*   Recent Labs  Lab 12/10/24 0147 12/11/24 0355 12/11/24 1132 12/11/24 1217 12/12/24 0037 12/12/24 0500  NA 143  147* 145  146* 147* 145 147* 146*  K 4.8 4.2 4.4  --   --  4.6  CL 112* 113* 115*  --   --  114*  CO2 20* 20* 20*  --   --  17*  BUN 72* 80* 84*  --   --  95*  CREATININE 3.1* 3.7* 4.1*  --   --  4.9*  CALCIUM  8.5* 8.6* 8.7  --   --  8.9  ALKPHOS 86  --   --   --   --   --   GLUCOSE 219*  228* 227*  240* 202* 229* 227* 208*   Lab Results  Component Value Date   PTH 384 (H) 04/04/2024   CALCIUM  8.9 12/12/2024   PHOS 4.7 (H) 12/12/2024   Lab Results  Component Value Date   IRON  53 11/08/2024   TIBC 193 (L) 11/08/2024   FERRITIN 2,777 (H) 11/08/2024   Assessment and Recommendations  Nicholas Glenn is a 69 y.o. 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 Nephrology is consulted for immunosuppression management.  Baseline creatinine: ~2.0, though more recently has been in the mid 2s.  Last biopsy: 09/29/24 - OSH biopsy with no evidence of rejection (negative C4d) and was positive for ATN with calcium  phosphate crystals and atherosclerotic changes with 25% interstitial fibrosis . Complications: post-transplant hematoma, seroma, recurrent AKIs 2/2 heart failure exacerbations (07/2024, 09/2024).    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 Repeat done 1/22 showed slight increase, not thought to be accounting for rise in creatinine  Reasonable to trial diuresis today for rising CVP to 10-12 on invasive monitoring  No dialysis indications at present but dialysis consent has been obtained and is in HD unit, high risk for progressing to have a dialysis need in the coming days     Immunosuppression Tacrolimus  stopped after sirolimus initiation. Patient has refractory lymphoma, high EBV viral load, and active cytotoxic chemotherapy, so we feel that the risk of rejection is less than the risks of progressive cancer Continue on sirolimus 2 mg daily  F/u sirolimus trough 1/23 AM, goal ~8-10 sirolimus downregulates VEFG and is associated with cytopenias, proteinuria, and impaired wound healing which can limit its use. Spot urine protein quant on 1/16 suggestive of 1 gram a day proteinuria and would  need to stop if consistently >1.5 g/day proteinuria MPA: Permanently discontinued due to EBV (+) PTLD Steroids: Prednisone  5 mg daily.   Prophylaxis CMV: Letermovir  PJP: Bactrim  for 12 months s/p kidney transplant   PTLD per HemOnc, plan for GDP (Gemcitabine, Dexamethasone , Carboplatin, Rituximab)  reduced IS as above  Thank you for this consult. We will continue to follow the patient with you.  Please feel free to call with questions or concerns.   KIRSTEN ALMARIE LUNGER, MD  Transplant  service pager [phone]   The Nephrology Consult team is available in-house from 7A-5P. If urgent assistance is needed outside of these hours, please page the Nephrology on-call pager [phone] for assistance. The Nephrology consult pagers are available after business hours for emergencies only.   Attestation Statement:   I personally saw and evaluated the patient, and participated in the management and treatment plan as documented in the resident/fellow note.  Glendia Rodgers Patrick, MD  "

## 2024-12-12 NOTE — Progress Notes (Signed)
"   °                 Pharmacokinetic Monitoring Note               Nicholas Glenn is a 69 y.o. male who is receiving vancomycin  therapy for bacteremia, with a goal trough 10-15 mcg/mL.  Current vancomycin  dosing is dosing per levels.    Pertinent Objective Data: Ht: 174.8 cm (5' 8.82) Wt: 80.2 kg (176 lb 12.9 oz) Ideal body weight: 70.3 kg (154 lb 15.2 oz) Adjusted ideal body weight: 74.3 kg (163 lb 11.1 oz) Recent Labs  Lab 12/11/24 0355 12/11/24 1132 12/11/24 1217 12/11/24 1614 12/11/24 1922 12/12/24 0500  WBC 0.6*  --    < > 0.6* 0.6* 0.8*  BUN 80* 84*  --   --   --  95*  CREATININE 3.7* 4.1*  --   --   --  4.9*   < > = values in this interval not displayed.    Vancomycin  Level: Recent Labs  Lab 12/11/24 1132 12/12/24 1214  VANCOTROUGH 21.2*  --   VANCORANDOM  --  18.5    Assessment/Plan: Vancomycin  random level was drawn 72 hours after the last dose.    Based on this information, no vancomycin  dose today. Will allow additional time for more clearance and re-dose with vancomycin  1000 mg x1 tomorrow at 12PM (96 hours after last dose).   Thank you for the consult. A clinical pharmacist will continue to monitor the patient's renal function, cultures/sensitivities, clinical response to therapy, and need for levels during this admission per hospital protocol. For additional questions, please page the covering pharmacist.  KARYL GLIMPSE, PharmD, BCPS, BCCCP  "

## 2024-12-12 NOTE — Procedures (Signed)
 Nicholas Glenn is a 69 y.o. male patient. 1. Altered mental status, unspecified altered mental status type   2. Tonsillar hypertrophy   3. AKI (acute kidney injury)   4. Dysphagia, oropharyngeal phase   5. PTLD (post-transplant lymphoproliferative disorder) (CMS/HHS-HCC)   6. Tachycardia   7. Airway compromise   8. Acute kidney injury   9. Atrial fibrillation with rapid ventricular response (CMS/HHS-HCC)   10. Immunocompromised (HHS-HCC)   11. Aftercare following organ transplant   12. Kidney replaced by transplant (HHS-HCC)   13. Stage 3b chronic kidney disease (CMS-HCC)   14. Acidosis   15. Mixed hyperlipidemia   16. 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 Hemoptysis    Past Medical History:  Diagnosis Date   Acute GI bleeding 07/23/2021   Acute on chronic diastolic CHF (congestive heart failure) (CMS/HHS-HCC) 08/29/2024   Anemia in chronic kidney disease 03/09/2021   Basal cell carcinoma 05/07/2015   left ear, s/p Mohs surgery   CHF (congestive heart failure) (CMS/HHS-HCC) 2019   Presumed to be anthracycline-induced; EF 40% by SPECT 10/2018   Coronary artery disease involving native coronary artery of native heart without angina pectoris 04/08/2020   Diabetes (CMS/HHS-HCC)    Diverticulitis 02/15/18   History of melanoma    October and December 2014   History of melanoma 03/2015   left neck behind the ear.    History of melanoma, 1997 06/11/2012   back. 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 (CMS/HHS-HCC)    as per onc problem list   Motion sickness    OSA (obstructive sleep apnea) 01/04/2019   Other nonthrombocytopenic purpura 06/15/2021   Other specified diseases of liver 03/09/2021   Other specified diseases of liver 03/09/2021   PKD (polycystic kidney disease) 06/11/2012   S/p nephrectomy, bilateral native 05/29/2005   Sleep apnea    Snores    Status post living-donor kidney  transplantation (HHS-HCC) 08/14/2005   Transient alteration of awareness 10/21/2018   Volume overload 03/14/2021   Blood pressure (!) 166/69, pulse 108, temperature (!) 38.1 C (100.6 F), temperature source Core Bladder, resp. rate (!) 29, height 174.8 cm (5' 8.82), weight 80.2 kg (176 lb 12.9 oz), SpO2 95%.  Bronchoscopy  Date/Time: 12/12/2024 4:54 PM  Performed by: Naomi Serge, MD Authorized by: Mariel Garnette Dover, MD   Consent:    Consent obtained:  Verbal   Consent given by:  Spouse   Risks, benefits, and alternatives were discussed: yes     Risks discussed:  Bleeding, infection and pain Universal protocol:    Patient identity confirmed:  Arm band Procedure specific details:      Bronchoscopy Procedure Note  Procedure Date: 12/12/2024   Procedure: Flexible Bronchoscopy Provider: SERGE [facility]OMI [facility]  Indication:  Hemoptysis  Anesthesia: Deep Sedation  Consent/Timeout: The risks, benefits, possible complications, treatment options and expected outcomes were discussed with the family who agreed to proceed with bronchoscopy.  Time out protocol was followed to verify the identity of the patient and the correct procedure being performed with the patient's participation and agreement. An N95 or PAPR was used by all team members in the room along with eye protection (faceshield, goggles or safety glasses.  Description of Procedure: After the induction of topical nasopharyngeal anesthesia, the patient was positioned semi-recumbant  and the bronchoscope was passed through the endotracial tube. The scope was then passed into the trachea. Careful inspection of the tracheal lumen was accomplished. The  scope was sequentially passed into the left main and then left upper and lower bronchi and segmental bronchi.  The scope was then withdrawn and advanced into the right main bronchus and then into the RUL, RML, and RLL bronchi and segmental bronchi.  Endobronchial findings:   Trachea: Normal mucosa Carina: Normal mucosa Right main bronchus: Normal mucosa Right upper lobe bronchus: Normal mucosa Right middle lobe bronchus: Normal mucosa, mucoid bloody secretions Right lower lobe bronchus: Normal mucosa, mucoid bloody secretions Left main bronchus: Normal mucosa, thrombus in the L main which was removed Left upper lobe bronchus: Normal mucosa Left lower lobe bronchus: Normal mucosa  Maneuvers: Therapeutic aspiration was performed to remove mucous plugs from Right middle lobe, Right lower lobe, and Left lower lobe.  The bronchial lumens were patent after the intervention  Complications: No immediate complications  ANTONINA ROY, MD 12/12/2024 4:54 PM  Risks and benefits of the procedure and feasibility and availability of respiratory PPE and use of negative pressure have been considered.  Procedure was not performed in a negative pressure room. Risk/benefit assessment was performed and relocation to a negative pressure room was felt to not be feasible due to the patient's condition. .  All operators in the room wore recommended PPE.     ANTONINA ROY 12/12/2024   ------------------------------------------------------------------------------- Attestation signed by Mariel Garnette Dover, MD at 12/12/2024  6:03 PM I was personally present during the key and critical portions of this procedure and immediately available throughout the entire procedure.  Garnette MYRTIS Mariel, MD Pulmonary Diseases and Critical Care Medicine  Date of Service: 12/12/2024  -------------------------------------------------------------------------------
